# Patient Record
Sex: Male | Born: 1935 | Race: White | Hispanic: No | Smoking: Former smoker
Health system: Southern US, Community
[De-identification: ages and names within clinical notes are randomized; demographics above are authoritative.]

## PROBLEM LIST (undated history)

## (undated) DIAGNOSIS — J849 Interstitial pulmonary disease, unspecified: Secondary | ICD-10-CM

## (undated) DIAGNOSIS — E039 Hypothyroidism, unspecified: Secondary | ICD-10-CM

## (undated) DIAGNOSIS — E785 Hyperlipidemia, unspecified: Secondary | ICD-10-CM

## (undated) DIAGNOSIS — I10 Essential (primary) hypertension: Secondary | ICD-10-CM

## (undated) HISTORY — PX: FEMORAL ARTERY REPAIR: SHX1582

## (undated) HISTORY — DX: Hypothyroidism, unspecified: E03.9

## (undated) HISTORY — DX: Essential (primary) hypertension: I10

## (undated) HISTORY — DX: Hyperlipidemia, unspecified: E78.5

---

## 2010-05-20 ENCOUNTER — Encounter: Payer: Self-pay | Admitting: Cardiovascular Disease

## 2010-11-22 ENCOUNTER — Encounter: Payer: Self-pay | Admitting: Internal Medicine

## 2010-12-19 ENCOUNTER — Encounter: Payer: Self-pay | Admitting: Internal Medicine

## 2011-01-28 ENCOUNTER — Encounter: Payer: Self-pay | Admitting: Internal Medicine

## 2011-01-31 ENCOUNTER — Encounter: Payer: Self-pay | Admitting: Internal Medicine

## 2014-01-15 ENCOUNTER — Emergency Department: Payer: Self-pay | Admitting: Emergency Medicine

## 2015-04-15 ENCOUNTER — Emergency Department: Payer: Medicare Other

## 2015-04-15 ENCOUNTER — Inpatient Hospital Stay
Admission: EM | Admit: 2015-04-15 | Discharge: 2015-04-20 | DRG: 418 | Disposition: A | Discharge status: Home / Self Care | Payer: Medicare Other | Attending: General Surgery | Admitting: General Surgery

## 2015-04-15 DIAGNOSIS — R1011 Right upper quadrant pain: Secondary | ICD-10-CM | POA: Diagnosis present

## 2015-04-15 DIAGNOSIS — K819 Cholecystitis, unspecified: Secondary | ICD-10-CM | POA: Diagnosis present

## 2015-04-15 DIAGNOSIS — Z7982 Long term (current) use of aspirin: Secondary | ICD-10-CM | POA: Diagnosis not present

## 2015-04-15 DIAGNOSIS — Z87891 Personal history of nicotine dependence: Secondary | ICD-10-CM

## 2015-04-15 DIAGNOSIS — K567 Ileus, unspecified: Secondary | ICD-10-CM | POA: Diagnosis not present

## 2015-04-15 DIAGNOSIS — E785 Hyperlipidemia, unspecified: Secondary | ICD-10-CM | POA: Diagnosis present

## 2015-04-15 DIAGNOSIS — Z888 Allergy status to other drugs, medicaments and biological substances status: Secondary | ICD-10-CM | POA: Diagnosis not present

## 2015-04-15 DIAGNOSIS — Z79899 Other long term (current) drug therapy: Secondary | ICD-10-CM

## 2015-04-15 DIAGNOSIS — K8012 Calculus of gallbladder with acute and chronic cholecystitis without obstruction: Secondary | ICD-10-CM | POA: Diagnosis present

## 2015-04-15 DIAGNOSIS — E039 Hypothyroidism, unspecified: Secondary | ICD-10-CM | POA: Diagnosis present

## 2015-04-15 DIAGNOSIS — R5381 Other malaise: Secondary | ICD-10-CM | POA: Diagnosis not present

## 2015-04-15 DIAGNOSIS — K660 Peritoneal adhesions (postprocedural) (postinfection): Secondary | ICD-10-CM | POA: Diagnosis present

## 2015-04-15 DIAGNOSIS — I10 Essential (primary) hypertension: Secondary | ICD-10-CM | POA: Diagnosis present

## 2015-04-15 DIAGNOSIS — Z01818 Encounter for other preprocedural examination: Secondary | ICD-10-CM | POA: Diagnosis not present

## 2015-04-15 LAB — COMPREHENSIVE METABOLIC PANEL
ALT: 17 U/L (ref 17–63)
AST: 23 U/L (ref 15–41)
Albumin: 3.9 g/dL (ref 3.5–5.0)
Alkaline Phosphatase: 56 U/L (ref 38–126)
Anion gap: 7 (ref 5–15)
BILIRUBIN TOTAL: 1.5 mg/dL — AB (ref 0.3–1.2)
BUN: 23 mg/dL — AB (ref 6–20)
CO2: 26 mmol/L (ref 22–32)
CREATININE: 1.21 mg/dL (ref 0.61–1.24)
Calcium: 9.4 mg/dL (ref 8.9–10.3)
Chloride: 104 mmol/L (ref 101–111)
GFR, EST AFRICAN AMERICAN: 57 mL/min — AB (ref >60)
GFR, EST NON AFRICAN AMERICAN: 49 mL/min — AB (ref >60)
Glucose, Bld: 126 mg/dL — ABNORMAL HIGH (ref 65–99)
Potassium: 4.6 mmol/L (ref 3.5–5.1)
Sodium: 137 mmol/L (ref 135–145)
TOTAL PROTEIN: 7.8 g/dL (ref 6.5–8.1)

## 2015-04-15 LAB — CBC WITH DIFFERENTIAL/PLATELET
BASOS ABS: 0 10*3/uL (ref 0–0.1)
BASOS PCT: 1 %
EOS ABS: 0.1 10*3/uL (ref 0–0.7)
EOS PCT: 1 %
HCT: 43.9 % (ref 40.0–52.0)
Hemoglobin: 14.3 g/dL (ref 13.0–18.0)
Lymphocytes Relative: 13 %
Lymphs Abs: 1.1 10*3/uL (ref 1.0–3.6)
MCH: 31.4 pg (ref 26.0–34.0)
MCHC: 32.6 g/dL (ref 32.0–36.0)
MCV: 96.3 fL (ref 80.0–100.0)
Monocytes Absolute: 1.2 10*3/uL — ABNORMAL HIGH (ref 0.2–1.0)
Monocytes Relative: 15 %
Neutro Abs: 5.6 10*3/uL (ref 1.4–6.5)
Neutrophils Relative %: 70 %
PLATELETS: 184 10*3/uL (ref 150–440)
RBC: 4.55 MIL/uL (ref 4.40–5.90)
RDW: 13.5 % (ref 11.5–14.5)
WBC: 8 10*3/uL (ref 3.8–10.6)

## 2015-04-15 LAB — TROPONIN I

## 2015-04-15 LAB — LIPASE, BLOOD: Lipase: 24 U/L (ref 11–51)

## 2015-04-15 MED ORDER — ONDANSETRON HCL 4 MG/2ML IJ SOLN
4.0000 mg | Freq: Once | INTRAMUSCULAR | Status: AC
  Administered 2015-04-15: 4 mg via INTRAVENOUS
  Filled 2015-04-15: qty 2

## 2015-04-15 MED ORDER — DILTIAZEM HCL ER COATED BEADS 180 MG PO CP24
180.0000 mg | ORAL_CAPSULE | Freq: Every day | ORAL | Status: DC
  Administered 2015-04-15: 180 mg via ORAL
  Filled 2015-04-15 (×2): qty 1

## 2015-04-15 MED ORDER — DIPHENHYDRAMINE HCL 12.5 MG/5ML PO ELIX: 12.5000 mg | ORAL_SOLUTION | Freq: Four times a day (QID) | ORAL | Status: DC | PRN

## 2015-04-15 MED ORDER — FENTANYL CITRATE (PF) 100 MCG/2ML IJ SOLN
50.0000 ug | Freq: Once | INTRAMUSCULAR | Status: AC
  Administered 2015-04-15: 50 ug via INTRAVENOUS
  Filled 2015-04-15: qty 2

## 2015-04-15 MED ORDER — ONDANSETRON 4 MG PO TBDP: 4.0000 mg | ORAL_TABLET | Freq: Four times a day (QID) | ORAL | Status: DC | PRN

## 2015-04-15 MED ORDER — PIPERACILLIN-TAZOBACTAM 3.375 G IVPB
3.3750 g | Freq: Once | INTRAVENOUS | Status: AC
  Administered 2015-04-15: 3.375 g via INTRAVENOUS
  Filled 2015-04-15: qty 50

## 2015-04-15 MED ORDER — HYDRALAZINE HCL 20 MG/ML IJ SOLN: 10.0000 mg | INTRAMUSCULAR | Status: DC | PRN

## 2015-04-15 MED ORDER — LACTATED RINGERS IV SOLN
INTRAVENOUS | Status: DC
Start: 2015-04-15 — End: 2015-04-18
  Administered 2015-04-15: 1000 mL via INTRAVENOUS
  Administered 2015-04-16 – 2015-04-18 (×4): via INTRAVENOUS

## 2015-04-15 MED ORDER — MORPHINE SULFATE (PF) 2 MG/ML IV SOLN
2.0000 mg | INTRAVENOUS | Status: DC | PRN
  Administered 2015-04-15 – 2015-04-16 (×2): 2 mg via INTRAVENOUS
  Filled 2015-04-15 (×2): qty 1

## 2015-04-15 MED ORDER — SODIUM CHLORIDE 0.9 % IV BOLUS (SEPSIS)
1000.0000 mL | Freq: Once | INTRAVENOUS | Status: AC
  Administered 2015-04-15: 1000 mL via INTRAVENOUS

## 2015-04-15 MED ORDER — DIPHENHYDRAMINE HCL 50 MG/ML IJ SOLN: 12.5000 mg | Freq: Four times a day (QID) | INTRAMUSCULAR | Status: DC | PRN

## 2015-04-15 MED ORDER — ACETAMINOPHEN 325 MG PO TABS
650.0000 mg | ORAL_TABLET | Freq: Four times a day (QID) | ORAL | Status: DC | PRN
  Administered 2015-04-16 (×2): 650 mg via ORAL
  Filled 2015-04-15 (×2): qty 2

## 2015-04-15 MED ORDER — ROSUVASTATIN CALCIUM 10 MG PO TABS
20.0000 mg | ORAL_TABLET | Freq: Every day | ORAL | Status: DC
Start: 2015-04-15 — End: 2015-04-16
  Administered 2015-04-15: 20 mg via ORAL
  Filled 2015-04-15: qty 2

## 2015-04-15 MED ORDER — ENOXAPARIN SODIUM 40 MG/0.4ML ~~LOC~~ SOLN
40.0000 mg | SUBCUTANEOUS | Status: DC
Start: 2015-04-15 — End: 2015-04-20
  Administered 2015-04-15 – 2015-04-19 (×5): 40 mg via SUBCUTANEOUS
  Filled 2015-04-15 (×6): qty 0.4

## 2015-04-15 MED ORDER — ONDANSETRON HCL 4 MG/2ML IJ SOLN
4.0000 mg | Freq: Four times a day (QID) | INTRAMUSCULAR | Status: DC | PRN
  Administered 2015-04-16 – 2015-04-17 (×3): 4 mg via INTRAVENOUS
  Filled 2015-04-15 (×3): qty 2

## 2015-04-15 MED ORDER — FENTANYL CITRATE (PF) 100 MCG/2ML IJ SOLN
50.0000 ug | Freq: Once | INTRAMUSCULAR | Status: AC
Start: 2015-04-15 — End: 2015-04-15
  Administered 2015-04-15: 50 ug via INTRAVENOUS
  Filled 2015-04-15: qty 2

## 2015-04-15 MED ORDER — CEFTRIAXONE SODIUM 2 G IJ SOLR
2.0000 g | INTRAMUSCULAR | Status: DC
  Administered 2015-04-15 – 2015-04-18 (×4): 2 g via INTRAVENOUS
  Filled 2015-04-15 (×5): qty 2

## 2015-04-15 MED ORDER — HYDROCHLOROTHIAZIDE 25 MG PO TABS
25.0000 mg | ORAL_TABLET | Freq: Every day | ORAL | Status: DC
Start: 2015-04-15 — End: 2015-04-16
  Administered 2015-04-15: 25 mg via ORAL
  Filled 2015-04-15: qty 1

## 2015-04-15 NOTE — H&P (Signed)
Patient ID: SENG LARCH, male   DOB: Sep 17, 1917, 81 y.o.   MRN: [id number] CC: ABDOMINAL PAIN HPI Paul Cross is a 81 y.o. male presents emergency department with a three-day history of abdominal pain. Patient states he's been having right upper quadrant abdominal pain on and off for the last 2 weeks. It was worsened after eating grits. He states today the pain is right upper quadrant started and did not go away causing him to seek care in the emergency department. Prior to that and today he never had pain like this before. He states he's had subjective nausea without emesis. Otherwise he has been in his usual state of good health. He denies any fevers, chills, chest pain, shortness of breath, diarrhea, constipation.  HPI  Past Medical History  Diagnosis Date  . Hypothyroidism     approximately 2009  . Hypertension   . Hyperlipidemia     Past Surgical History  Procedure Laterality Date  . Femoral artery repair     abdominal aortic aneurysm repair.  Family History  Problem Relation Age of Onset  . Cancer Sister     breast  . Diabetes Brother     Social History Social History  Substance Use Topics  . Smoking status: Former Research scientist (life sciences)  . Smokeless tobacco: None  . Alcohol Use: None    Allergies  Allergen Reactions  . Diphenhydramine Hcl Other (See Comments)    Reaction:  Unknown   . Simvastatin Other (See Comments)    Reaction:  Unknown     Current Facility-Administered Medications  Medication Dose Route Frequency Provider Last Rate Last Dose  . cefTRIAXone (ROCEPHIN) 2 g in dextrose 5 % 50 mL IVPB  2 g Intravenous Q24H Clayburn Pert, MD      . diltiazem (CARDIZEM CD) 24 hr capsule 180 mg  180 mg Oral Daily Clayburn Pert, MD      . diphenhydrAMINE (BENADRYL) 12.5 MG/5ML elixir 12.5 mg  12.5 mg Oral Q6H PRN Clayburn Pert, MD       Or  . diphenhydrAMINE (BENADRYL) injection 12.5 mg  12.5 mg Intravenous Q6H PRN Clayburn Pert, MD      . enoxaparin  (LOVENOX) injection 40 mg  40 mg Subcutaneous Q24H Clayburn Pert, MD      . hydrALAZINE (APRESOLINE) injection 10 mg  10 mg Intravenous Q2H PRN Clayburn Pert, MD      . hydrochlorothiazide (HYDRODIURIL) tablet 25 mg  25 mg Oral Daily Clayburn Pert, MD      . lactated ringers infusion   Intravenous Continuous Clayburn Pert, MD      . morphine 2 MG/ML injection 2 mg  2 mg Intravenous Q2H PRN Clayburn Pert, MD      . ondansetron (ZOFRAN-ODT) disintegrating tablet 4 mg  4 mg Oral Q6H PRN Clayburn Pert, MD       Or  . ondansetron [facility]) injection 4 mg  4 mg Intravenous Q6H PRN Clayburn Pert, MD      . rosuvastatin (CRESTOR) tablet 20 mg  20 mg Oral QHS Clayburn Pert, MD         Review of Systems A multipoint review of systems was completed. All pertinent positives and negatives within the history of present illness the remainder were negative.  Physical Exam Blood pressure 139/71, pulse 76, temperature 97.9 F (36.6 C), temperature source Oral, resp. rate 19, height 6' (1.829 m), weight 90.719 kg (200 lb), SpO2 94 %. CONSTITUTIONAL: Resting in bed in no acute distress. EYES: Pupils are  equal, round, and reactive to light, Sclera are non-icteric. EARS, NOSE, MOUTH AND THROAT: The oropharynx is clear. The oral mucosa is pink and moist. Hearing is intact to voice. LYMPH NODES:  Lymph nodes in the neck are normal. RESPIRATORY:  Lungs are clear. There is normal respiratory effort, with equal breath sounds bilaterally, and without pathologic use of accessory muscles. CARDIOVASCULAR: Heart is regular without murmurs, gallops, or rubs. GI: The abdomen is soft, tender to deep palpation in the right upper quadrant without Murphy sign, and nondistended. There is a large well-healed midline scar .There are no palpable masses. There is no hepatosplenomegaly. There are normal bowel sounds in all quadrants. GU: Rectal deferred.   MUSCULOSKELETAL: Normal muscle strength and tone. No cyanosis  or edema.   SKIN: Turgor is good and there are no pathologic skin lesions or ulcers. NEUROLOGIC: Motor and sensation is grossly normal. Cranial nerves are grossly intact. PSYCH:  Oriented to person, place and time. Affect is normal.  Data Reviewed All labs and images reviewed by me. Labs mostly within normal limits. Mildly elevated bilirubin of 1.5. CBC within normal limits. Ultrasound right upper quadrant shows evidence of cholecystitis. There is a thickened gallbladder wall with some pericholecystic fluid and a stone lodged at the infundibulum. I have personally reviewed the patient's imaging, laboratory findings and medical records.    Assessment    81 year old male with acute cholecystitis    Plan    Had a long discussion with patient about the surgical treatments of cholecystitis. However given his age we'll plan to admit to the ward for preoperative clearance. Patient was discussed with anesthesia who recommended medical consultation prior to surgery. We will tentatively plan for surgery tomorrow after medical clearance.     Time spent with the patient was 30 minutes, with more than 50% of the time spent in face-to-face education, counseling and care coordination.     Clayburn Pert 04/15/2015, 5:34 PM

## 2015-04-15 NOTE — ED Notes (Signed)
Spoke with Dr. Burlene Arnt about patient and EKG.  Verbal order obtained for troponin.

## 2015-04-15 NOTE — ED Provider Notes (Signed)
[facility] Emergency Department Provider Note  ____________________________________________  Time seen: Approximately 1:23 PM  I have reviewed the triage vital signs and the nursing notes.   HISTORY  Chief Complaint Abdominal Pain    HPI Paul Cross is a 81 y.o. male with a history of femoral artery repair, HTN, presentingwith epigastric and right upper quadrant pain associated with nausea. Patient reports that over the past 2 weeks he has had 2 separate episodes where he developed a dull ache in the epigastric and right upper quadrants after eating grits. This was associated with nausea and vomiting. This morning at 2 AM he woke up with this same pain with nausea but no vomiting. He describes a "dead" feeling in the abdomen with sharp pains with positional changes. He denies any fever, chills, urinary symptoms, chest pain or shortness of breath, or trauma. His pain is significantly improved with fentanyl.   Past Medical History  Diagnosis Date  . Hypothyroidism     approximately 2009  . Hypertension   . Hyperlipidemia     There are no active problems to display for this patient.   Past Surgical History  Procedure Laterality Date  . Femoral artery repair      Current Outpatient Rx  Name  Route  Sig  Dispense  Refill  . aspirin EC 81 MG tablet   Oral   Take 81 mg by mouth daily.         Marland Kitchen diltiazem (CARDIZEM CD) 180 MG 24 hr capsule   Oral   Take 180 mg by mouth daily.           . hydrochlorothiazide 25 MG tablet   Oral   Take 25 mg by mouth daily.           . Levothyroxine Sodium (SYNTHROID PO)   Oral   Take 1 tablet by mouth daily.          . rosuvastatin (CRESTOR) 20 MG tablet   Oral   Take 20 mg by mouth at bedtime.          . vitamin B-12 (CYANOCOBALAMIN) 1000 MCG tablet   Oral   Take 1,000 mcg by mouth daily.         . Vitamin D, Ergocalciferol, (DRISDOL) 50000 UNITS CAPS capsule   Oral   Take 50,000  Units by mouth every 30 (thirty) days.           Allergies Diphenhydramine hcl and Simvastatin  Family History  Problem Relation Age of Onset  . Cancer Sister     breast  . Diabetes Brother     Social History Social History  Substance Use Topics  . Smoking status: Former Research scientist (life sciences)  . Smokeless tobacco: None  . Alcohol Use: None    Review of Systems Constitutional: No fever/chills. No lightheadedness or syncope. Eyes: No visual changes. ENT: No sore throat. Cardiovascular: Denies chest pain, palpitations. Respiratory: Denies shortness of breath.  No cough. Gastrointestinal: Positive epigastric and right upper quadrant abdominal pain.  Positive nausea and vomiting.  No diarrhea; last bowel movement was this morning which was normal except for that straining causes pain..  No constipation. Genitourinary: Negative for dysuria. Musculoskeletal: Negative for back pain. Skin: Negative for rash. Neurological: Negative for headaches, focal weakness or numbness.  10-point ROS otherwise negative.  ____________________________________________   PHYSICAL EXAM:  VITAL SIGNS: ED Triage Vitals  Enc Vitals Group     BP 04/15/15 1030 145/68 mmHg     Pulse  Rate 04/15/15 1030 90     Resp 04/15/15 1030 16     Temp 04/15/15 1030 97.9 F (36.6 C)     Temp Source 04/15/15 1030 Oral     SpO2 04/15/15 1030 97 %     Weight 04/15/15 1030 200 lb (90.719 kg)     Height 04/15/15 1030 6' (1.829 m)     Head Cir --      Peak Flow --      Pain Score 04/15/15 1031 10     Pain Loc --      Pain Edu? --      Excl. in [place]? --     Constitutional: Alert and oriented. Well appearing and in no acute distress lying comfortably in the stretcher.. Answer question appropriately. Eyes: Conjunctivae are normal.  EOMI. Head: Atraumatic. Nose: No congestion/rhinnorhea. Mouth/Throat: Mucous membranes are moist.  Neck: No stridor.  Supple.   Cardiovascular: Normal rate, regular rhythm. No murmurs, rubs or  gallops.  Respiratory: Normal respiratory effort.  No retractions. Lungs CTAB.  No wheezes, rales or ronchi. Gastrointestinal: Abdomen is soft and nondistended. The patient has right upper quadrant greater than epigastric and right lower quadrant pain. No guarding or rebound. No peritoneal signs. Bowel sounds are minimal. Musculoskeletal: No LE edema.  Neurologic:  Normal speech and language. No gross focal neurologic deficits are appreciated.  Skin:  Skin is warm, dry and intact. No rash noted. Psychiatric: Mood and affect are normal. Speech and behavior are normal.  Normal judgement.  ____________________________________________   LABS (all labs ordered are listed, but only abnormal results are displayed)  Labs Reviewed  COMPREHENSIVE METABOLIC PANEL - Abnormal; Notable for the following:    Glucose, Bld 126 (*)    BUN 23 (*)    Total Bilirubin 1.5 (*)    GFR calc non Af Amer 49 (*)    GFR calc Af Amer 57 (*)    All other components within normal limits  CBC WITH DIFFERENTIAL/PLATELET - Abnormal; Notable for the following:    Monocytes Absolute 1.2 (*)    All other components within normal limits  LIPASE, BLOOD  TROPONIN I   ____________________________________________  EKG  ED ECG REPORT I, Eula Listen, the attending physician, personally viewed and interpreted this ECG.   Date: 04/15/2015  EKG Time: 1035  Rate: 89  Rhythm: LBBB  Axis: Leftward  Intervals:none  ST&T Change: No ST elevation. Negative for [place] criteria.  We have no previous EKGs for comparison.  ____________________________________________  RADIOLOGY  No results found.  ____________________________________________   PROCEDURES  Procedure(s) performed: None  Critical Care performed: No ____________________________________________   INITIAL IMPRESSION / ASSESSMENT AND PLAN / ED COURSE  Pertinent labs & imaging results that were available during my care of the patient were  reviewed by me and considered in my medical decision making (see chart for details).  81 y.o. male with a history of femoral artery repair presenting with epigastric and right upper quadrant pain that is precipitated by meals. His exam is consistent with possible gallbladder disease. He is afebrile at this time and nontoxic. He does have a left bundle branch block on his EKG and we have no previous EKGs for comparison. He is not having chest pain or shortness of breath however in this age group it is possible that he is having atypical anginal equivalent. This is less likely than a primary gallbladder disease. I will plan to ultrasound his gallbladder and if that is negative CT scan of  the abdomen. We will also try and his troponin. I will continue his symptomatically treatment which appears to be improving his pain at this time.   ----------------------------------------- 2:56 PM on 04/15/2015 -----------------------------------------  The patient has noticed on that is consistent with acute cholecystitis. He is afebrile and has normal white blood cell count. I have called the surgeon on-call to evaluate the patient. ____________________________________________  FINAL CLINICAL IMPRESSION(S) / ED DIAGNOSES  Final diagnoses:  Right upper quadrant pain      NEW MEDICATIONS STARTED DURING THIS VISIT:  New Prescriptions   No medications on file     Eula Listen, MD 04/15/15 1902

## 2015-04-15 NOTE — Consult Note (Signed)
Patient Demographics  Paul Cross, is a 81 y.o. male   MRN: [id number]   DOB - [birth year]  Admit Date - 04/15/2015    Outpatient Primary MD for the patient is No primary care provider on file.  Consult requested in the Hospital by Clayburn Pert, MD, On 04/15/2015    Reason for consult; preop clearance for cholecystectomy   With History of -  Past Medical History  Diagnosis Date  . Hypothyroidism     approximately 2009  . Hypertension   . Hyperlipidemia       Past Surgical History  Procedure Laterality Date  . Femoral artery repair      in for   Chief Complaint  Patient presents with  . Abdominal Pain     HPI  Paul Cross  is a 81 y.o. male,  With htn,HLP admitted to surgical unit because of abdominal pain and cholecystitis. Preop clearance is requested. Patient denies chest pain, shortness of breath, coronary artery disease. He lives alone and uses cane for ambulation.   Review of Systems    In addition to the HPI above No Fever-chills, No Headache, No changes with Vision or hearing, No problems swallowing food or Liquids, No Chest pain, Cough or Shortness of Breath, Right upper quadrant abdominal pain, .No Blood in stool or Urine, No dysuria, No new skin rashes or bruises, No new joints pains-aches,  No new weakness, tingling, numbness in any extremity, No recent weight gain or loss, No polyuria, polydypsia or polyphagia, No significant Mental Stressors.  A full 10 point Review of Systems was done, except as stated above, all other Review of Systems were negative.   Social History Social History  Substance Use Topics  . Smoking status: Former Research scientist (life sciences)  . Smokeless tobacco: Not on file  . Alcohol Use: Not on file    Family History Family History  Problem Relation Age  of Onset  . Cancer Sister     breast  . Diabetes Brother      Prior to Admission medications   Medication Sig Start Date End Date Taking? Authorizing Provider  aspirin EC 81 MG tablet Take 81 mg by mouth daily.   Yes Historical Provider, MD  atorvastatin (LIPITOR) 40 MG tablet Take 20 mg by mouth at bedtime.   Yes Historical Provider, MD  diltiazem (DILACOR XR) 120 MG 24 hr capsule Take 120 mg by mouth daily.   Yes Historical Provider, MD  levothyroxine (SYNTHROID, LEVOTHROID) 100 MCG tablet Take 100 mcg by mouth daily before breakfast.   Yes Historical Provider, MD  tamsulosin (FLOMAX) 0.4 MG CAPS capsule Take 0.4 mg by mouth daily.   Yes Historical Provider, MD  vitamin B-12 (CYANOCOBALAMIN) 1000 MCG tablet Take 1,000 mcg by mouth daily.   Yes Historical Provider, MD  Vitamin D, Ergocalciferol, (DRISDOL) 50000 UNITS CAPS capsule Take 50,000 Units by mouth every 30 (thirty) days.   Yes Historical Provider, MD  zinc oxide 20 % ointment Apply 1 application topically 2 (two) times daily.  Yes Historical Provider, MD    Anti-infectives    Start     Dose/Rate Route Frequency Ordered Stop   04/15/15 1730  cefTRIAXone (ROCEPHIN) 2 g in dextrose 5 % 50 mL IVPB     2 g 100 mL/hr over 30 Minutes Intravenous Every 24 hours 04/15/15 1726     04/15/15 1515  piperacillin-tazobactam (ZOSYN) IVPB 3.375 g     3.375 g 12.5 mL/hr over 240 Minutes Intravenous  Once 04/15/15 1501 04/15/15 1651      Scheduled Meds: . cefTRIAXone (ROCEPHIN)  IV  2 g Intravenous Q24H  . diltiazem  180 mg Oral Daily  . enoxaparin (LOVENOX) injection  40 mg Subcutaneous Q24H  . hydrochlorothiazide  25 mg Oral Daily  . rosuvastatin  20 mg Oral QHS   Continuous Infusions: . lactated ringers 1,000 mL (04/15/15 1830)   PRN Meds:.diphenhydrAMINE **OR** diphenhydrAMINE, hydrALAZINE, morphine injection, ondansetron **OR** ondansetron (ZOFRAN) IV  Allergies  Allergen Reactions  . Diphenhydramine Hcl Other (See Comments)     Reaction:  Unknown   . Simvastatin Other (See Comments)    Reaction:  Unknown     Physical Exam  Vitals  Blood pressure 131/69, pulse 82, temperature 99.2 F (37.3 C), temperature source Oral, resp. rate 18, height 6' (1.829 m), weight 89.086 kg (196 lb 6.4 oz), SpO2 92 %.   1. General  lying in bed in some mild right upper quadrant pain present,  2. Normal affect and insight, Not Suicidal or Homicidal, Awake Alert, Oriented X 3.  3. No F.N deficits, ALL C.Nerves Intact, Strength 5/5 all 4 extremities, Sensation intact all 4 extremities, Plantars down going.  4. Ears and Eyes appear Normal, Conjunctivae clear, PERRLA. Moist Oral Mucosa.  5. Supple Neck, No JVD, No cervical lymphadenopathy appriciated, No Carotid Bruits.  6. Symmetrical Chest wall movement, Good air movement bilaterally, CTAB.  7. RRR, No Gallops, Rubs or Murmurs, No Parasternal Heave.  8. Positive Bowel Sounds, Abdomen Soft, right upper  quadrant tenderness.present. No organomegaly appriciated,No rebound -guarding or rigidity.  9.  No Cyanosis, Normal Skin Turgor, No Skin Rash or Bruise.  10. Good muscle tone,  joints appear normal , no effusions, Normal ROM.  11. No Palpable Lymph Nodes in Neck or Axillae   Data Review  CBC  Recent Labs Lab 04/15/15 1042  WBC 8.0  HGB 14.3  HCT 43.9  PLT 184  MCV 96.3  MCH 31.4  MCHC 32.6  RDW 13.5  LYMPHSABS 1.1  MONOABS 1.2*  EOSABS 0.1  BASOSABS 0.0   ------------------------------------------------------------------------------------------------------------------  Chemistries   Recent Labs Lab 04/15/15 1042  NA 137  K 4.6  CL 104  CO2 26  GLUCOSE 126*  BUN 23*  CREATININE 1.21  CALCIUM 9.4  AST 23  ALT 17  ALKPHOS 56  BILITOT 1.5*   ------------------------------------------------------------------------------------------------------------------ estimated creatinine clearance is 40.1 mL/min (by C-G formula based on Cr of  1.21). ------------------------------------------------------------------------------------------------------------------ No results for input(s): TSH, T4TOTAL, T3FREE, THYROIDAB in the last 72 hours.  Invalid input(s): FREET3   Coagulation profile No results for input(s): INR, PROTIME in the last 168 hours. ------------------------------------------------------------------------------------------------------------------- No results for input(s): DDIMER in the last 72 hours. -------------------------------------------------------------------------------------------------------------------  Cardiac Enzymes  Recent Labs Lab 04/15/15 1042  TROPONINI <0.03   ------------------------------------------------------------------------------------------------------------------ Invalid input(s): POCBNP   ---------------------------------------------------------------------------------------------------------------  Urinalysis No results found for: COLORURINE, APPEARANCEUR, LABSPEC, PHURINE, GLUCOSEU, HGBUR, BILIRUBINUR, KETONESUR, PROTEINUR, UROBILINOGEN, NITRITE, LEUKOCYTESUR   Imaging results:   US Abdomen Limited Ruq  04/15/2015  CLINICAL DATA:  Right upper quadrant abdominal pain since 2 a.m. today. EXAM: US ABDOMEN LIMITED - RIGHT UPPER QUADRANT COMPARISON:  None. FINDINGS: Gallbladder: Gallbladder wall is thickened, measuring 5.9 mm. A 2.0 cm gallstone is sludge within the gallbladder neck. Although there is no sonographic Murphy sign, the patient was given pain medication prior to the exam. Common bile duct: Diameter: 5.1 mm Liver: Visualized portion of the liver has a normal appearance. The left hepatic lobe is obscured by bowel gas. IMPRESSION: 1. Gallbladder wall thickening and gallstone lodged within the gallbladder neck, suspicious for acute cholecystitis despite the negative sonographic Murphy's sign. As the patient had pain medication, the Murphy's sign may not be reliable. 2.  Obscured left hepatic lobe. Electronically Signed   By: Nolon Nations M.D.   On: 04/15/2015 14:51    My personal review of EKG: Rhythm NSR, Rate  89 /min, , no Acute ST changes, EKG done in the emergency room and intubated by ER physician.    Assessment & Plan  Active Problems:   Cholecystitis   81 year old male patient with hypertension and hyperlipidemia without any coronary artery disease or COPD admitted for acute cholecystitis: The patient is at moderate risk for the planned surgery secondary to advanced age, risk factors of hypertension and hyperlipidemia. Recommend postop incentive spirometry and the perioperative blood pressure control with IV beta blockers.  For hypertension patient is already on HCTZ, rhythm advised to continue them. #3. for hyperlipidemia he is already on a*20 mg daily. Continue them  DVT Prophylaxis Heparin -  Lovenox - SCDs   AM Labs Ordered, also please review Full Orders  Family Communication: Plan discussed with patient and    Thank you for the consult, we will follow the patient with you in the Hospital.   [facility] M.D on 04/15/2015 at 7:58 PM  Note: This dictation was prepared with Dragon dictation along with smaller phrase technology. Any transcriptional errors that result from this process are unintentional.

## 2015-04-15 NOTE — ED Notes (Signed)
Patient complains of mid abdominal pain that radiates to right side and around back.  Patient reports having BM yesterday and is passing gas.  Patient reports attempting to have BM this morning but pain gets worse when bearing down.

## 2015-04-16 ENCOUNTER — Inpatient Hospital Stay: Payer: Medicare Other | Admitting: Anesthesiology

## 2015-04-16 ENCOUNTER — Encounter: Payer: Self-pay | Admitting: General Surgery

## 2015-04-16 ENCOUNTER — Encounter
Admission: EM | Disposition: A | Discharge status: Home / Self Care | Payer: Self-pay | Source: Home / Self Care | Attending: General Surgery

## 2015-04-16 DIAGNOSIS — K819 Cholecystitis, unspecified: Secondary | ICD-10-CM

## 2015-04-16 HISTORY — PX: CHOLECYSTECTOMY: SHX55

## 2015-04-16 SURGERY — LAPAROSCOPIC CHOLECYSTECTOMY
Anesthesia: General

## 2015-04-16 MED ORDER — ROCURONIUM BROMIDE 100 MG/10ML IV SOLN
INTRAVENOUS | Status: DC | PRN
  Administered 2015-04-16: 10 mg via INTRAVENOUS
  Administered 2015-04-16: 20 mg via INTRAVENOUS

## 2015-04-16 MED ORDER — ATORVASTATIN CALCIUM 20 MG PO TABS
20.0000 mg | ORAL_TABLET | Freq: Every day | ORAL | Status: DC
  Administered 2015-04-16 – 2015-04-19 (×3): 20 mg via ORAL
  Filled 2015-04-16 (×3): qty 1

## 2015-04-16 MED ORDER — PROPOFOL 10 MG/ML IV BOLUS
INTRAVENOUS | Status: DC | PRN
  Administered 2015-04-16: 100 mg via INTRAVENOUS

## 2015-04-16 MED ORDER — SUCCINYLCHOLINE CHLORIDE 20 MG/ML IJ SOLN
INTRAMUSCULAR | Status: DC | PRN
  Administered 2015-04-16: 100 mg via INTRAVENOUS

## 2015-04-16 MED ORDER — FENTANYL CITRATE (PF) 100 MCG/2ML IJ SOLN
INTRAMUSCULAR | Status: DC | PRN
  Administered 2015-04-16: 50 ug via INTRAVENOUS
  Administered 2015-04-16: 25 ug via INTRAVENOUS
  Administered 2015-04-16: 50 ug via INTRAVENOUS

## 2015-04-16 MED ORDER — LEVOTHYROXINE SODIUM 100 MCG PO TABS
100.0000 ug | ORAL_TABLET | Freq: Every day | ORAL | Status: DC
  Administered 2015-04-17 – 2015-04-20 (×4): 100 ug via ORAL
  Filled 2015-04-16 (×5): qty 1

## 2015-04-16 MED ORDER — LIDOCAINE HCL (PF) 1 % IJ SOLN
INTRAMUSCULAR | Status: AC
  Filled 2015-04-16: qty 30

## 2015-04-16 MED ORDER — GLYCOPYRROLATE 0.2 MG/ML IJ SOLN
INTRAMUSCULAR | Status: DC | PRN
  Administered 2015-04-16: .6 mg via INTRAVENOUS

## 2015-04-16 MED ORDER — DILTIAZEM HCL ER 120 MG PO CP24
120.0000 mg | ORAL_CAPSULE | Freq: Every day | ORAL | Status: DC
  Administered 2015-04-16 – 2015-04-20 (×5): 120 mg via ORAL
  Filled 2015-04-16 (×10): qty 1

## 2015-04-16 MED ORDER — METOCLOPRAMIDE HCL 5 MG/ML IJ SOLN
INTRAMUSCULAR | Status: AC
  Administered 2015-04-16: 10 mg via INTRAVENOUS
  Filled 2015-04-16: qty 2

## 2015-04-16 MED ORDER — METOCLOPRAMIDE HCL 5 MG/ML IJ SOLN
10.0000 mg | Freq: Once | INTRAMUSCULAR | Status: AC
  Administered 2015-04-16: 10 mg via INTRAVENOUS

## 2015-04-16 MED ORDER — BUPIVACAINE HCL 0.5 % IJ SOLN
INTRAMUSCULAR | Status: DC | PRN
  Administered 2015-04-16: 12.5 mL

## 2015-04-16 MED ORDER — BUPIVACAINE HCL (PF) 0.5 % IJ SOLN
INTRAMUSCULAR | Status: AC
  Filled 2015-04-16: qty 30

## 2015-04-16 MED ORDER — NEOSTIGMINE METHYLSULFATE 10 MG/10ML IV SOLN
INTRAVENOUS | Status: DC | PRN
  Administered 2015-04-16: 4 mg via INTRAVENOUS

## 2015-04-16 MED ORDER — MEPERIDINE HCL 25 MG/ML IJ SOLN: 6.2500 mg | INTRAMUSCULAR | Status: DC | PRN

## 2015-04-16 MED ORDER — LIDOCAINE HCL (PF) 1 % IJ SOLN
INTRAMUSCULAR | Status: DC | PRN
  Administered 2015-04-16: 12.5 mL

## 2015-04-16 MED ORDER — FENTANYL CITRATE (PF) 100 MCG/2ML IJ SOLN
25.0000 ug | INTRAMUSCULAR | Status: DC | PRN
  Administered 2015-04-16 (×4): 25 ug via INTRAVENOUS

## 2015-04-16 MED ORDER — FENTANYL CITRATE (PF) 100 MCG/2ML IJ SOLN
INTRAMUSCULAR | Status: AC
  Administered 2015-04-16: 25 ug via INTRAVENOUS
  Filled 2015-04-16: qty 2

## 2015-04-16 MED ORDER — TAMSULOSIN HCL 0.4 MG PO CAPS
0.4000 mg | ORAL_CAPSULE | Freq: Every day | ORAL | Status: DC
  Administered 2015-04-16 – 2015-04-20 (×5): 0.4 mg via ORAL
  Filled 2015-04-16 (×5): qty 1

## 2015-04-16 MED ORDER — EPHEDRINE SULFATE 50 MG/ML IJ SOLN
INTRAMUSCULAR | Status: DC | PRN
  Administered 2015-04-16: 5 mg via INTRAVENOUS

## 2015-04-16 SURGICAL SUPPLY — 49 items
ADHESIVE MASTISOL STRL (MISCELLANEOUS) IMPLANT
APPLIER CLIP ROT 10 11.4 M/L (STAPLE) ×3
BAG COUNTER SPONGE EZ (MISCELLANEOUS) ×2 IMPLANT
BLADE SURG SZ11 CARB STEEL (BLADE) ×3 IMPLANT
BULB RESERV EVAC DRAIN JP 100C (MISCELLANEOUS) IMPLANT
CANISTER SUCT 1200ML W/VALVE (MISCELLANEOUS) ×3 IMPLANT
CATH REDDICK CHOLANGI 4FR 50CM (CATHETERS) IMPLANT
CHLORAPREP W/TINT 26ML (MISCELLANEOUS) ×3 IMPLANT
CLIP APPLIE ROT 10 11.4 M/L (STAPLE) ×1 IMPLANT
CLOSURE WOUND 1/2 X4 (GAUZE/BANDAGES/DRESSINGS)
CONRAY 60ML FOR OR (MISCELLANEOUS) IMPLANT
COUNTER SPONGE BAG EZ (MISCELLANEOUS) ×1
DISSECTOR KITTNER STICK (MISCELLANEOUS) ×1 IMPLANT
DISSECTORS/KITTNER STICK (MISCELLANEOUS) ×3
DRAIN CHANNEL JP 19F (MISCELLANEOUS) IMPLANT
DRAPE SHEET LG 3/4 BI-LAMINATE (DRAPES) ×3 IMPLANT
DRSG TEGADERM 2-3/8X2-3/4 SM (GAUZE/BANDAGES/DRESSINGS) IMPLANT
DRSG TELFA 3X8 NADH (GAUZE/BANDAGES/DRESSINGS) IMPLANT
ENDOPOUCH RETRIEVER 10 (MISCELLANEOUS) ×3 IMPLANT
GLOVE BIO SURGEON STRL SZ7.5 (GLOVE) ×3 IMPLANT
GLOVE INDICATOR 8.0 STRL GRN (GLOVE) ×3 IMPLANT
GOWN STRL REUS W/ TWL LRG LVL3 (GOWN DISPOSABLE) ×3 IMPLANT
GOWN STRL REUS W/TWL LRG LVL3 (GOWN DISPOSABLE) ×6
IRRIGATION STRYKERFLOW (MISCELLANEOUS) IMPLANT
IRRIGATOR STRYKERFLOW (MISCELLANEOUS)
IV CATH ANGIO 12GX3 LT BLUE (NEEDLE) IMPLANT
IV NS 1000ML (IV SOLUTION)
IV NS 1000ML BAXH (IV SOLUTION) IMPLANT
L-HOOK LAP DISP 36CM (ELECTROSURGICAL) ×3
LABEL OR SOLS (LABEL) ×3 IMPLANT
LHOOK LAP DISP 36CM (ELECTROSURGICAL) ×1 IMPLANT
LIQUID BAND (GAUZE/BANDAGES/DRESSINGS) IMPLANT
NEEDLE HYPO 25X1 1.5 SAFETY (NEEDLE) ×3 IMPLANT
NEEDLE VERESS 14GA 120MM (NEEDLE) ×3 IMPLANT
NS IRRIG 500ML POUR BTL (IV SOLUTION) ×3 IMPLANT
PACK LAP CHOLECYSTECTOMY (MISCELLANEOUS) ×3 IMPLANT
PAD GROUND ADULT SPLIT (MISCELLANEOUS) ×3 IMPLANT
PENCIL ELECTRO HAND CTR (MISCELLANEOUS) ×3 IMPLANT
SCISSORS METZENBAUM CVD 33 (INSTRUMENTS) ×3 IMPLANT
SLEEVE ENDOPATH XCEL 5M (ENDOMECHANICALS) ×6 IMPLANT
STRAP SAFETY BODY (MISCELLANEOUS) ×3 IMPLANT
STRIP CLOSURE SKIN 1/2X4 (GAUZE/BANDAGES/DRESSINGS) IMPLANT
SUT MNCRL 4-0 (SUTURE) ×2
SUT MNCRL 4-0 27XMFL (SUTURE) ×1
SUT VICRYL 0 AB UR-6 (SUTURE) ×3 IMPLANT
SUTURE MNCRL 4-0 27XMF (SUTURE) ×1 IMPLANT
TROCAR XCEL NON-BLD 11X100MML (ENDOMECHANICALS) ×3 IMPLANT
TROCAR XCEL NON-BLD 5MMX100MML (ENDOMECHANICALS) ×3 IMPLANT
TUBING INSUFFLATOR HI FLOW (MISCELLANEOUS) ×3 IMPLANT

## 2015-04-16 NOTE — Op Note (Signed)
Laparoscopic Cholecystectomy  Pre-operative Diagnosis: Acute Cholecystitis  Post-operative Diagnosis: Acute on Chronic Cholecystitis  Procedure: Laparoscopic Cholecystectomy  Surgeon: Juanda Crumble T. Adonis Huguenin, MD FACS  Anesthesia: Gen. with endotracheal tube  Assistant:None  Procedure Details  The patient was seen again in the Holding Room. The benefits, complications, treatment options, and expected outcomes were discussed with the patient. The risks of bleeding, infection, recurrence of symptoms, failure to resolve symptoms, bile duct damage, bile duct leak, retained common bile duct stone, bowel injury, any of which could require further surgery and/or ERCP, stent, or papillotomy were reviewed with the patient. The likelihood of improving the patient's symptoms with return to their baseline status is good.  The patient and/or family concurred with the proposed plan, giving informed consent.  The patient was taken to Operating Room, identified as Paul Cross and the procedure verified as Laparoscopic Cholecystectomy.  A Time Out was held and the above information confirmed.  Prior to the induction of general anesthesia, antibiotic prophylaxis was administered. VTE prophylaxis was in place. General endotracheal anesthesia was then administered and tolerated well. After the induction, the abdomen was prepped with Chloraprep and draped in the sterile fashion. The patient was positioned in the supine position.  Local anesthetic  was injected into the skin in the right upper quadrant in the mid-clavicular line. The Veress needle was placed. Pneumoperitoneum was then created with CO2 and tolerated well without any adverse changes in the patient's vital signs. A 15mm port was placed in the periumbilical position and the abdominal cavity was explored.  Two 5-mm ports were placed, one at the umbilicus and another in the right upper quadrant and a 12 mm epigastric port was placed all under direct  vision. All skin incisions  were infiltrated with a local anesthetic agent before making the incision and placing the trocars. Multiple small bowel adhesions were noted to the abdominal wall. They were able to be left alone as they did not impede visualization.  The patient was positioned  in reverse Trendelenburg, tilted slightly to the patient's left.  The gallbladder was identified, the fundus grasped and retracted cephalad. Adhesions were lysed bluntly. The adhesions were thick and acutely inflamed. The infundibulum was grasped and retracted laterally, exposing the peritoneum overlying the triangle of Calot. This was then divided and exposed in a blunt fashion. A critical view of the cystic duct and cystic artery was obtained.  The cystic duct was clearly identified and bluntly dissected. The duct and artery were serially clipped with three endo-clips and then cut with endo-shears.  The gallbladder was taken from the gallbladder fossa in a retrograde fashion with the electrocautery. The gallbladder was removed and placed in an Endocatch bag. The liver bed was irrigated and inspected. Hemostasis was achieved with the electrocautery. Copious irrigation was utilized and was repeatedly aspirated until clear.  The gallbladder and Endocatch sac were then removed through the epigastric port site. The port site had to be enlarged in order to extract the gallbladder.The posterior gallbladder wall was entered into during it's dissection off the liver. This was cauterized and left in place.   Inspection of the right upper quadrant was performed. No bleeding, bile duct injury or leak, or bowel injury was noted. Pneumoperitoneum was released.  The epigastric port site was closed with figure-of-eight 0 Vicryl sutures. 4-0 subcuticular Monocryl was used to close the skin. Steristrips and Mastisol and sterile dressings were  applied.  The patient was then extubated and brought to the recovery room in stable  condition.  Sponge, lap, and needle counts were correct at closure and at the conclusion of the case.   Findings: Acute on Chronic Cholecystitis   Estimated Blood Loss: 30         Drains: none         Specimens: Gallbladder           Complications: none               Paul Fern T. Adonis Huguenin, MD, FACS

## 2015-04-16 NOTE — Brief Op Note (Signed)
04/15/2015 - 04/16/2015  12:41 PM  PATIENT:  Paul Cross  81 y.o. male  PRE-OPERATIVE DIAGNOSIS:  gallstones  POST-OPERATIVE DIAGNOSIS:  gallstones   PROCEDURE:  Procedure(s): LAPAROSCOPIC CHOLECYSTECTOMY (N/A)  SURGEON:  Surgeon(s) and Role:    * Clayburn Pert, MD - Primary  PHYSICIAN ASSISTANT:   ASSISTANTS: none   ANESTHESIA:   general  EBL:  Total I/O In: 1513.3 [I.V.:1513.3] Out: 600 [Urine:600]  BLOOD ADMINISTERED:none  DRAINS: none   LOCAL MEDICATIONS USED:  MARCAINE   , XYLOCAINE  and Amount: 25 ml  SPECIMEN:  Source of Specimen:  gallbladder  DISPOSITION OF SPECIMEN:  PATHOLOGY  COUNTS:  YES  TOURNIQUET:  * No tourniquets in log *  DICTATION: .Dragon Dictation  PLAN OF CARE: return to inpatient setting  PATIENT DISPOSITION:  PACU - hemodynamically stable.   Delay start of Pharmacological VTE agent (>24hrs) due to surgical blood loss or risk of bleeding: no

## 2015-04-16 NOTE — Progress Notes (Deleted)
Notified Dr. Genevive Bi that patient did cough up blood one other time. Also per Dr. Genevive Bi okay to place order for ensure per patient request.

## 2015-04-16 NOTE — Progress Notes (Addendum)
Spoke to Dr. Adonis Huguenin, okay to make suggested med changes per pharmacy note on summary page as verified by [facility] pharmacy.

## 2015-04-16 NOTE — Anesthesia Postprocedure Evaluation (Signed)
  Anesthesia Post-op Note  Patient: Paul Cross  Procedure(s) Performed: Procedure(s): LAPAROSCOPIC CHOLECYSTECTOMY (N/A)  Anesthesia type:General  Patient location: PACU  Post pain: Pain level controlled  Post assessment: Post-op Vital signs reviewed, Patient's Cardiovascular Status Stable, Respiratory Function Stable, Patent Airway and No signs of Nausea or vomiting  Post vital signs: Reviewed and stable  Last Vitals:  Filed Vitals:   04/16/15 1340  BP: 144/76  Pulse: 78  Temp: 36.8 C  Resp: 19    Level of consciousness: awake, alert  and patient cooperative  Complications: No apparent anesthesia complications

## 2015-04-16 NOTE — Anesthesia Procedure Notes (Signed)
Procedure Name: Intubation Date/Time: 04/16/2015 11:28 AM Performed by: Aline Brochure Pre-anesthesia Checklist: Patient identified, Emergency Drugs available, Suction available and Patient being monitored Patient Re-evaluated:Patient Re-evaluated prior to inductionOxygen Delivery Method: Circle system utilized Preoxygenation: Pre-oxygenation with 100% oxygen Intubation Type: IV induction and Cricoid Pressure applied Ventilation: Oral airway inserted - appropriate to patient size and Mask ventilation without difficulty Laryngoscope Size: Mac and 3 Grade View: Grade I Tube size: 7.5 mm Number of attempts: 1 Airway Equipment and Method: Patient positioned with wedge pillow and Stylet Placement Confirmation: ETT inserted through vocal cords under direct vision,  positive ETCO2 and breath sounds checked- equal and bilateral Secured at: 21 cm Tube secured with: Tape Dental Injury: Teeth and Oropharynx as per pre-operative assessment

## 2015-04-16 NOTE — Anesthesia Preprocedure Evaluation (Signed)
Anesthesia Evaluation  Patient identified by MRN, date of birth, ID band Patient awake    Airway Mallampati: III  TM Distance: <3 FB     Dental  (+) Edentulous Upper, Edentulous Lower   Pulmonary neg pulmonary ROS, former smoker,    breath sounds clear to auscultation       Cardiovascular hypertension,  Rhythm:Regular Rate:Normal     Neuro/Psych negative neurological ROS     GI/Hepatic Neg liver ROS, GERD  ,  Endo/Other  Hypothyroidism   Renal/GU negative Renal ROS  negative genitourinary   Musculoskeletal   Abdominal (+)  Abdomen: soft.    Peds  Hematology   Anesthesia Other Findings   Reproductive/Obstetrics                             Anesthesia Physical Anesthesia Plan  ASA: III  Anesthesia Plan: General   Post-op Pain Management:    Induction: Intravenous  Airway Management Planned: Oral ETT  Additional Equipment:   Intra-op Plan:   Post-operative Plan: Extubation in OR  Informed Consent: I have reviewed the patients History and Physical, chart, labs and discussed the procedure including the risks, benefits and alternatives for the proposed anesthesia with the patient or authorized representative who has indicated his/her understanding and acceptance.     Plan Discussed with: CRNA  Anesthesia Plan Comments:         Anesthesia Quick Evaluation

## 2015-04-16 NOTE — Progress Notes (Signed)
Day of Surgery   Subjective:  81 year old male status post laparoscopic cholecystectomy. Patient states he feels much better now than before surgery. He is hungry and ready for dinner.  Vital signs in last 24 hours: Temp:  [97.5 F (36.4 C)-101.3 F (38.5 C)] 97.8 F (36.6 C) (10/27 1528) Pulse Rate:  [67-89] 80 (10/27 1528) Resp:  [14-21] 20 (10/27 1528) BP: (130-162)/(59-108) 143/75 mmHg (10/27 1528) SpO2:  [86 %-99 %] 99 % (10/27 1528) Last BM Date: 04/13/15  Intake/Output from previous day: 10/26 0701 - 10/27 0700 In: 786.7 [I.V.:786.7] Out: 600 [Urine:600]  GI: Abdomen is soft, appropriately tender to palpation around incisions. Nondistended.  Lab Results:  CBC  Recent Labs  04/15/15 1042  WBC 8.0  HGB 14.3  HCT 43.9  PLT 184   CMP     Component Value Date/Time   NA 137 04/15/2015 1042   K 4.6 04/15/2015 1042   CL 104 04/15/2015 1042   CO2 26 04/15/2015 1042   GLUCOSE 126* 04/15/2015 1042   BUN 23* 04/15/2015 1042   CREATININE 1.21 04/15/2015 1042   CALCIUM 9.4 04/15/2015 1042   PROT 7.8 04/15/2015 1042   ALBUMIN 3.9 04/15/2015 1042   AST 23 04/15/2015 1042   ALT 17 04/15/2015 1042   ALKPHOS 56 04/15/2015 1042   BILITOT 1.5* 04/15/2015 1042   GFRNONAA 49* 04/15/2015 1042   GFRAA 57* 04/15/2015 1042   PT/INR No results for input(s): LABPROT, INR in the last 72 hours.  Studies/Results: US Abdomen Limited Ruq  04/15/2015  CLINICAL DATA:  Right upper quadrant abdominal pain since 2 a.m. today. EXAM: US ABDOMEN LIMITED - RIGHT UPPER QUADRANT COMPARISON:  None. FINDINGS: Gallbladder: Gallbladder wall is thickened, measuring 5.9 mm. A 2.0 cm gallstone is sludge within the gallbladder neck. Although there is no sonographic Murphy sign, the patient was given pain medication prior to the exam. Common bile duct: Diameter: 5.1 mm Liver: Visualized portion of the liver has a normal appearance. The left hepatic lobe is obscured by bowel gas. IMPRESSION: 1.  Gallbladder wall thickening and gallstone lodged within the gallbladder neck, suspicious for acute cholecystitis despite the negative sonographic Murphy's sign. As the patient had pain medication, the Murphy's sign may not be reliable. 2. Obscured left hepatic lobe. Electronically Signed   By: Nolon Nations M.D.   On: 04/15/2015 14:51    Assessment/Plan: 81 year old male status post lap scopic cholestatic. Discussed with the patient and family about keeping in the hospital 1 additional night due to his advanced age. Lungs he progresses well overnight plan for discharge in the morning.  Clayburn Pert, MD FACS General Surgeon [facility]

## 2015-04-16 NOTE — Transfer of Care (Signed)
Immediate Anesthesia Transfer of Care Note  Patient: Paul Cross  Procedure(s) Performed: Procedure(s): LAPAROSCOPIC CHOLECYSTECTOMY (N/A)  Patient Location: PACU  Anesthesia Type:General  Level of Consciousness: sedated  Airway & Oxygen Therapy: Patient Spontanous Breathing and Patient connected to face mask oxygen  Post-op Assessment: Report given to RN and Post -op Vital signs reviewed and unstable, Anesthesiologist notified  Post vital signs: Reviewed and stable  Last Vitals:  Filed Vitals:   04/16/15 0841  BP: 150/62  Pulse: 69  Temp: 36.8 C  Resp: 20    Complications: No apparent anesthesia complications

## 2015-04-17 ENCOUNTER — Inpatient Hospital Stay: Payer: Medicare Other

## 2015-04-17 LAB — SURGICAL PATHOLOGY

## 2015-04-17 MED ORDER — CALCIUM CARBONATE ANTACID 500 MG PO CHEW: 1.0000 | CHEWABLE_TABLET | Freq: Three times a day (TID) | ORAL | Status: DC | PRN

## 2015-04-17 NOTE — Progress Notes (Signed)
1 Day Post-Op   Subjective:  81 year old male 1 day status post laparoscopic cholecystectomy for acute on chronic cholecystitis. Patient had episodes of nausea and vomiting early this morning. However he states his pain continues to be much improved. He denies having much of an appetite at this time. He has not been out of bed however he has passed flatus.  Vital signs in last 24 hours: Temp:  [97.5 F (36.4 C)-99.2 F (37.3 C)] 98.6 F (37 C) (10/28 0425) Pulse Rate:  [75-91] 80 (10/28 0429) Resp:  [14-21] 20 (10/28 0425) BP: (129-162)/(52-108) 150/60 mmHg (10/28 0505) SpO2:  [90 %-99 %] 93 % (10/28 0425) Last BM Date: 04/13/15  Intake/Output from previous day: 10/27 0701 - 10/28 0700 In: 3805.3 [P.O.:60; I.V.:3745.3] Out: 2195 [Urine:2175; Blood:20]  GI: Abdomen is soft, appropriately tender to palpation at incision sites, mildly distended and tympanic consistent with retained CO2.  Lab Results:  CBC  Recent Labs  04/15/15 1042  WBC 8.0  HGB 14.3  HCT 43.9  PLT 184   CMP     Component Value Date/Time   NA 137 04/15/2015 1042   K 4.6 04/15/2015 1042   CL 104 04/15/2015 1042   CO2 26 04/15/2015 1042   GLUCOSE 126* 04/15/2015 1042   BUN 23* 04/15/2015 1042   CREATININE 1.21 04/15/2015 1042   CALCIUM 9.4 04/15/2015 1042   PROT 7.8 04/15/2015 1042   ALBUMIN 3.9 04/15/2015 1042   AST 23 04/15/2015 1042   ALT 17 04/15/2015 1042   ALKPHOS 56 04/15/2015 1042   BILITOT 1.5* 04/15/2015 1042   GFRNONAA 49* 04/15/2015 1042   GFRAA 57* 04/15/2015 1042   PT/INR No results for input(s): LABPROT, INR in the last 72 hours.  Studies/Results: US Abdomen Limited Ruq  04/15/2015  CLINICAL DATA:  Right upper quadrant abdominal pain since 2 a.m. today. EXAM: US ABDOMEN LIMITED - RIGHT UPPER QUADRANT COMPARISON:  None. FINDINGS: Gallbladder: Gallbladder wall is thickened, measuring 5.9 mm. A 2.0 cm gallstone is sludge within the gallbladder neck. Although there is no sonographic  Murphy sign, the patient was given pain medication prior to the exam. Common bile duct: Diameter: 5.1 mm Liver: Visualized portion of the liver has a normal appearance. The left hepatic lobe is obscured by bowel gas. IMPRESSION: 1. Gallbladder wall thickening and gallstone lodged within the gallbladder neck, suspicious for acute cholecystitis despite the negative sonographic Murphy's sign. As the patient had pain medication, the Murphy's sign may not be reliable. 2. Obscured left hepatic lobe. Electronically Signed   By: Nolon Nations M.D.   On: 04/15/2015 14:51    Assessment/Plan: 81 year old male postop day 1 status post lap scopic cholecystectomy. Discussed the importance of ambulation and requirement of tolerating a diet prior to be heavily discharged home. Patient daughter both voiced understanding. He is to ambulate today with assistance. If able to ambulate and tolerate diet we will then be ready for discharge.  Clayburn Pert, MD FACS General Surgeon [facility]

## 2015-04-17 NOTE — Care Management Note (Signed)
Case Management Note  Patient Details  Name: Paul Cross MRN: [id number] Date of Birth: [birth year]  Subjective/Objective:   Case management assessment for discharge planning needs. Admitted with cholecystitis. S/P lap/Chole 10/27. Patient lives at home alone. He is alert, oriented and independent with adls. Walking on unit.                   Action/Plan: No needs identified. Case closed.   Expected Discharge Date:                  Expected Discharge Plan:  Home/Self Care  In-House Referral:     Discharge planning Services  CM Consult  Post Acute Care Choice:    Choice offered to:     DME Arranged:    DME Agency:     HH Arranged:    [place] Agency:     Status of Service:  Completed, signed off  Medicare Important Message Given:  Yes-second notification given Date Medicare IM Given:    Medicare IM give by:    Date Additional Medicare IM Given:    Additional Medicare Important Message give by:     If discussed at [place] of Stay Meetings, dates discussed:    Additional Comments:  Jolly Mango, RN 04/17/2015, 1:38 PM

## 2015-04-17 NOTE — Progress Notes (Signed)
[place] at [facility] Internal medicine consult follow up   PATIENT NAME: Paul Cross    MR#:  [id number]  DATE OF BIRTH:  [birth year]  SUBJECTIVE:  CHIEF COMPLAINT:   Chief Complaint  Patient presents with  . Abdominal Pain   Episode of nausea this morning. Improving with walking and sitting up in chair.  REVIEW OF SYSTEMS:   Review of Systems  Constitutional: Negative for fever.  Respiratory: Negative for shortness of breath.   Cardiovascular: Negative for chest pain, palpitations and leg swelling.  Gastrointestinal: Negative for nausea, vomiting and abdominal pain.  Genitourinary: Negative for dysuria.  Neurological: Negative for dizziness, sensory change and headaches.    DRUG ALLERGIES:   Allergies  Allergen Reactions  . Diphenhydramine Hcl Other (See Comments)    Reaction:  Unknown   . Simvastatin Other (See Comments)    Reaction:  Unknown     VITALS:  Blood pressure 129/72, pulse 88, temperature 98.8 F (37.1 C), temperature source Oral, resp. rate 20, height 6' (1.829 m), weight 89.086 kg (196 lb 6.4 oz), SpO2 95 %.  PHYSICAL EXAMINATION:  GENERAL:  81 y.o.-year-old patient sitting up with no acute distress.  EYES: Pupils equal, round, reactive to light and accommodation. No scleral icterus. Extraocular muscles intact.  LUNGS: Normal breath sounds bilaterally, no wheezing, rales,rhonchi or crepitation. No use of accessory muscles of respiration.  CARDIOVASCULAR: S1, S2 normal. No murmurs, rubs, or gallops.  ABDOMEN: Soft, mild tenderness, nondistended. Bowel sounds present. No organomegaly or mass.  EXTREMITIES: No pedal edema, cyanosis, or clubbing. Pulses +1 NEUROLOGIC: Cranial nerves II through XII are intact. Muscle strength 5/5 in all extremities. Sensation intact. Gait not checked.  PSYCHIATRIC: The patient is alert and oriented x 3.  SKIN: surgical wounds are dressed and dry   LABORATORY PANEL:    CBC  Recent Labs Lab 04/15/15 1042  WBC 8.0  HGB 14.3  HCT 43.9  PLT 184   ------------------------------------------------------------------------------------------------------------------  Chemistries   Recent Labs Lab 04/15/15 1042  NA 137  K 4.6  CL 104  CO2 26  GLUCOSE 126*  BUN 23*  CREATININE 1.21  CALCIUM 9.4  AST 23  ALT 17  ALKPHOS 56  BILITOT 1.5*   ------------------------------------------------------------------------------------------------------------------  Cardiac Enzymes  Recent Labs Lab 04/15/15 1042  TROPONINI <0.03   ------------------------------------------------------------------------------------------------------------------  RADIOLOGY:  US Abdomen Limited Ruq  04/15/2015  CLINICAL DATA:  Right upper quadrant abdominal pain since 2 a.m. today. EXAM: US ABDOMEN LIMITED - RIGHT UPPER QUADRANT COMPARISON:  None. FINDINGS: Gallbladder: Gallbladder wall is thickened, measuring 5.9 mm. A 2.0 cm gallstone is sludge within the gallbladder neck. Although there is no sonographic Murphy sign, the patient was given pain medication prior to the exam. Common bile duct: Diameter: 5.1 mm Liver: Visualized portion of the liver has a normal appearance. The left hepatic lobe is obscured by bowel gas. IMPRESSION: 1. Gallbladder wall thickening and gallstone lodged within the gallbladder neck, suspicious for acute cholecystitis despite the negative sonographic Murphy's sign. As the patient had pain medication, the Murphy's sign may not be reliable. 2. Obscured left hepatic lobe. Electronically Signed   By: Nolon Nations M.D.   On: 04/15/2015 14:51    EKG:   Orders placed or performed during the hospital encounter of 04/15/15  . EKG 12-Lead  . EKG 12-Lead  . EKG 12-Lead  . EKG 12-Lead  . EKG    ASSESSMENT AND PLAN:   1) hypertension: elevated pressures likely related  to pain. Would hesitate to treat this age group aggressively and I am  comfortable with current pressures. No changes to make, continue diltiazem.  2) hyperlipidemia: continue statin  Overall patient doing well. Agree with potential dc for today.   All the records are reviewed and case discussed with Care Management/Social Workerr. Management plans discussed with the patient, family and they are in agreement.  CODE STATUS: full  TOTAL TIME TAKING CARE OF THIS PATIENT: 15 minutes.  Greater than 50% of time spent in care coordination and counseling. POSSIBLE D/C IN 1 DAYS, DEPENDING ON CLINICAL CONDITION.   Myrtis Ser M.D on 04/17/2015 at 10:56 AM  Between 7am to 6pm - Pager - [phone]  After 6pm go to [email or web address] - password [email or web address]  [place] [facility]  Office  [phone]  CC: Primary care physician; No primary care provider on file.

## 2015-04-17 NOTE — Care Management Important Message (Signed)
Important Message  Patient Details  Name: FOSTER FRERICKS MRN: [id number] Date of Birth: [birth year]   Medicare Important Message Given:  Yes-second notification given    Darius Bump Naheim Burgen 04/17/2015, 10:33 AM

## 2015-04-17 NOTE — Progress Notes (Signed)
Asked to evaluate Mr Paul Cross for abdominal distention and vomiting. Per nurse has vomited approx 1300 cc today.  Still with nausea.  Distended on exam.  Also reports burning in throat.  Will obtain KUB for baseline, have recommended NG tube for comfort.  Will review KUB with patient and will discuss NG tube further.

## 2015-04-18 LAB — CREATININE, SERUM
Creatinine, Ser: 1.21 mg/dL (ref 0.61–1.24)
GFR, EST AFRICAN AMERICAN: 57 mL/min — AB (ref >60)
GFR, EST NON AFRICAN AMERICAN: 49 mL/min — AB (ref >60)

## 2015-04-18 MED ORDER — DEXTROSE-NACL 5-0.9 % IV SOLN
INTRAVENOUS | Status: DC
  Administered 2015-04-18 – 2015-04-20 (×4): via INTRAVENOUS

## 2015-04-18 NOTE — Progress Notes (Signed)
[place] at [facility] Internal medicine consult follow up   PATIENT NAME: Paul Cross    MR#:  [id number]  DATE OF BIRTH:  05/16/1918  SUBJECTIVE:  CHIEF COMPLAINT:   Chief Complaint  Patient presents with  . Abdominal Pain   Nausea and vomiting yesterday revealing significant gastric distention and likely ileus after which patient had NG tube placed. Feels good today. Denies any nausea, vomiting or abdominal pain. Blood pressure is good  REVIEW OF SYSTEMS:   Review of Systems  Constitutional: Negative for fever.  Respiratory: Negative for shortness of breath.   Cardiovascular: Negative for chest pain, palpitations and leg swelling.  Gastrointestinal: Negative for nausea, vomiting and abdominal pain.  Genitourinary: Negative for dysuria.  Neurological: Negative for dizziness, sensory change and headaches.    DRUG ALLERGIES:   Allergies  Allergen Reactions  . Diphenhydramine Hcl Other (See Comments)    Reaction:  Unknown   . Simvastatin Other (See Comments)    Reaction:  Unknown     VITALS:  Blood pressure 133/61, pulse 79, temperature 98 F (36.7 C), temperature source Oral, resp. rate 18, height 6' (1.829 m), weight 89.086 kg (196 lb 6.4 oz), SpO2 95 %.  PHYSICAL EXAMINATION:  GENERAL:  81 y.o.-year-old patient sitting up with no acute distress. NG tube is in the house EYES: Pupils equal, round, reactive to light and accommodation. No scleral icterus. Extraocular muscles intact.  LUNGS: Normal breath sounds bilaterally, no wheezing, rales,rhonchi or crepitation. No use of accessory muscles of respiration.  CARDIOVASCULAR: S1, S2 normal. No murmurs, rubs, or gallops.  ABDOMEN: Soft, mild tenderness, nondistended. Bowel sounds present. No organomegaly or mass.  EXTREMITIES: No pedal edema, cyanosis, or clubbing. Pulses +1 NEUROLOGIC: Cranial nerves II through XII are intact. Muscle strength 5/5 in all extremities. Sensation  intact. Gait not checked.  PSYCHIATRIC: The patient is alert and oriented x 3.  SKIN: surgical wounds are dressed and dry   LABORATORY PANEL:   CBC  Recent Labs Lab 04/15/15 1042  WBC 8.0  HGB 14.3  HCT 43.9  PLT 184   ------------------------------------------------------------------------------------------------------------------  Chemistries   Recent Labs Lab 04/15/15 1042 04/18/15 0647  NA 137  --   K 4.6  --   CL 104  --   CO2 26  --   GLUCOSE 126*  --   BUN 23*  --   CREATININE 1.21 1.21  CALCIUM 9.4  --   AST 23  --   ALT 17  --   ALKPHOS 56  --   BILITOT 1.5*  --    ------------------------------------------------------------------------------------------------------------------  Cardiac Enzymes  Recent Labs Lab 04/15/15 1042  TROPONINI <0.03   ------------------------------------------------------------------------------------------------------------------  RADIOLOGY:  Dg Abd 1 View  04/17/2015  CLINICAL DATA:  Uncontrollable vomiting EXAM: ABDOMEN - 1 VIEW COMPARISON:  None. FINDINGS: Stomach is diffusely distended with air. There are loops of dilated bowel, concerning for a degree of obstruction. Air is noted in the rectum, however. No free air is evident. There is atelectatic change in lung bases. There are foci of arterial vascular calcification. IMPRESSION: Stomach distended with air. Loops of dilated small bowel, concerning for a degree of obstruction. There is noted in the rectum, however. Electronically Signed   By: Lowella Grip III M.D.   On: 04/17/2015 21:24    EKG:   Orders placed or performed during the hospital encounter of 04/15/15  . EKG 12-Lead  . EKG 12-Lead  . EKG 12-Lead  . EKG  12-Lead  . EKG    ASSESSMENT AND PLAN:   1) essential hypertension: elevated pressures likely related to pain. Would hesitate to treat this age group aggressively and I am comfortable with current pressures. No changes to make, continue  diltiazem.  2) hyperlipidemia: continue statin 3. Ileus, continue NG tube suctioning per surgery, withdrawal, depending on resolution 4. Nausea, vomiting, result is NG tube placement. Symptomatic therapy, IV fluids  Overall patient doing well. Agree with potential dc for today.   All the records are reviewed and case discussed with Care Management/Social Workerr. Management plans discussed with the patient, family and they are in agreement.  CODE STATUS: full  TOTAL TIME TAKING CARE OF THIS PATIENT: 25 minutes.  Greater than 50% of time spent in care coordination and counseling. Discussed with surgery. We'll sign off now. Please call with questions. Thank you   Ladaja Yusupov M.D on 04/18/2015 at 11:27 AM  Between 7am to 6pm - Pager - [phone]  After 6pm go to [email or web address] - password [email or web address] [facility]  [facility]  Office  [phone]  CC: Primary care physician; No primary care provider on file.

## 2015-04-18 NOTE — Progress Notes (Signed)
2 Days Post-Op   Subjective:  81 year old male postop day #2 status post laparoscopic cholecystectomy for acute cholecystitis. Patient developed continued nausea and vomiting overnight. Requiring placement of an NG tube. Patient states he feels much better after the NG tube was placed. No continued nausea. No abdominal pain.  Vital signs in last 24 hours: Temp:  [98 F (36.7 C)-98.8 F (37.1 C)] 98 F (36.7 C) (10/29 0456) Pulse Rate:  [79-97] 79 (10/29 0456) Resp:  [18-20] 18 (10/29 0456) BP: (123-133)/(60-74) 133/61 mmHg (10/29 0456) SpO2:  [87 %-95 %] 95 % (10/29 0456) Last BM Date: 04/14/15  Intake/Output from previous day: 10/28 0701 - 10/29 0700 In: 3267.6 [P.O.:720; I.V.:2407.6; NG/GT:90; IV Piggyback:50] Out: 6967 [Urine:1650; Emesis/NG output:2380]  Physical exam: Gen.: Resting bed in no acute distress Chest: Clear to auscultation Heart: Regular rate and rhythm GI: Abdomen is soft, nontender, minimally distended.  Lab Results:  CBC  Recent Labs  04/15/15 1042  WBC 8.0  HGB 14.3  HCT 43.9  PLT 184   CMP     Component Value Date/Time   NA 137 04/15/2015 1042   K 4.6 04/15/2015 1042   CL 104 04/15/2015 1042   CO2 26 04/15/2015 1042   GLUCOSE 126* 04/15/2015 1042   BUN 23* 04/15/2015 1042   CREATININE 1.21 04/18/2015 0647   CALCIUM 9.4 04/15/2015 1042   PROT 7.8 04/15/2015 1042   ALBUMIN 3.9 04/15/2015 1042   AST 23 04/15/2015 1042   ALT 17 04/15/2015 1042   ALKPHOS 56 04/15/2015 1042   BILITOT 1.5* 04/15/2015 1042   GFRNONAA 49* 04/18/2015 0647   GFRAA 57* 04/18/2015 0647   PT/INR No results for input(s): LABPROT, INR in the last 72 hours.  Studies/Results: Dg Abd 1 View  04/17/2015  CLINICAL DATA:  Uncontrollable vomiting EXAM: ABDOMEN - 1 VIEW COMPARISON:  None. FINDINGS: Stomach is diffusely distended with air. There are loops of dilated bowel, concerning for a degree of obstruction. Air is noted in the rectum, however. No free air is evident.  There is atelectatic change in lung bases. There are foci of arterial vascular calcification. IMPRESSION: Stomach distended with air. Loops of dilated small bowel, concerning for a degree of obstruction. There is noted in the rectum, however. Electronically Signed   By: Lowella Grip III M.D.   On: 04/17/2015 21:24    Assessment/Plan: 81 year old male status post lap scopic cholecystectomy for acute on chronic cholecystitis. Patient appears to have a proximal ileus. Symptoms much improved with NG tube. We'll continue to treat with NG tube decompression. We will trend output. Patient on a return of bowel function and functional status prior to discharge.  Clayburn Pert, MD [facility] General Surgeon [facility] Surgical  04/18/2015

## 2015-04-19 LAB — CBC
HEMATOCRIT: 35.8 % — AB (ref 40.0–52.0)
Hemoglobin: 12.2 g/dL — ABNORMAL LOW (ref 13.0–18.0)
MCH: 32.2 pg (ref 26.0–34.0)
MCHC: 34 g/dL (ref 32.0–36.0)
MCV: 94.8 fL (ref 80.0–100.0)
PLATELETS: 196 10*3/uL (ref 150–440)
RBC: 3.78 MIL/uL — AB (ref 4.40–5.90)
RDW: 13.3 % (ref 11.5–14.5)
WBC: 5.9 10*3/uL (ref 3.8–10.6)

## 2015-04-19 LAB — MAGNESIUM: MAGNESIUM: 2 mg/dL (ref 1.7–2.4)

## 2015-04-19 LAB — BASIC METABOLIC PANEL
ANION GAP: 7 (ref 5–15)
BUN: 15 mg/dL (ref 6–20)
CHLORIDE: 104 mmol/L (ref 101–111)
CO2: 28 mmol/L (ref 22–32)
Calcium: 8.4 mg/dL — ABNORMAL LOW (ref 8.9–10.3)
Creatinine, Ser: 0.96 mg/dL (ref 0.61–1.24)
GFR calc Af Amer: >60 mL/min (ref >60)
GLUCOSE: 120 mg/dL — AB (ref 65–99)
POTASSIUM: 3.4 mmol/L — AB (ref 3.5–5.1)
Sodium: 139 mmol/L (ref 135–145)

## 2015-04-19 LAB — HEPATIC FUNCTION PANEL
ALBUMIN: 2.7 g/dL — AB (ref 3.5–5.0)
ALT: 23 U/L (ref 17–63)
AST: 29 U/L (ref 15–41)
Alkaline Phosphatase: 54 U/L (ref 38–126)
BILIRUBIN TOTAL: 0.5 mg/dL (ref 0.3–1.2)
TOTAL PROTEIN: 6.1 g/dL — AB (ref 6.5–8.1)

## 2015-04-19 LAB — PHOSPHORUS: PHOSPHORUS: 2.1 mg/dL — AB (ref 2.5–4.6)

## 2015-04-19 NOTE — Progress Notes (Signed)
3 Days Post-Op   Subjective:  81 year old male postop day #3 status post lap scopic cholecystectomy for acute cholecystitis. States he continued to feel better with NG tube decompression overnight. His output has decreased overnight. He denies any current nausea or vomiting. Has a general malaise this morning is ready to go home.  Vital signs in last 24 hours: Temp:  [98 F (36.7 C)-98.3 F (36.8 C)] 98.3 F (36.8 C) (10/30 0548) Pulse Rate:  [70-76] 76 (10/30 0548) Resp:  [16-20] 16 (10/30 0548) BP: (132-161)/(66-71) 161/71 mmHg (10/30 0548) SpO2:  [92 %-97 %] 93 % (10/30 0548) Last BM Date: 04/14/15  Intake/Output from previous day: 10/29 0701 - 10/30 0700 In: 1878.1 [I.V.:1788.1; NG/GT:90] Out: 2150 [Urine:950; Emesis/NG output:1200]  GI: soft, non-tender; bowel sounds normal; no masses,  no organomegaly  Lab Results:  CBC  Recent Labs  04/19/15 0551  WBC 5.9  HGB 12.2*  HCT 35.8*  PLT 196   CMP     Component Value Date/Time   NA 139 04/19/2015 0551   K 3.4* 04/19/2015 0551   CL 104 04/19/2015 0551   CO2 28 04/19/2015 0551   GLUCOSE 120* 04/19/2015 0551   BUN 15 04/19/2015 0551   CREATININE 0.96 04/19/2015 0551   CALCIUM 8.4* 04/19/2015 0551   PROT 7.8 04/15/2015 1042   ALBUMIN 3.9 04/15/2015 1042   AST 23 04/15/2015 1042   ALT 17 04/15/2015 1042   ALKPHOS 56 04/15/2015 1042   BILITOT 1.5* 04/15/2015 1042   GFRNONAA >60 04/19/2015 0551   GFRAA >60 04/19/2015 0551   PT/INR No results for input(s): LABPROT, INR in the last 72 hours.  Studies/Results: Dg Abd 1 View  04/17/2015  CLINICAL DATA:  Uncontrollable vomiting EXAM: ABDOMEN - 1 VIEW COMPARISON:  None. FINDINGS: Stomach is diffusely distended with air. There are loops of dilated bowel, concerning for a degree of obstruction. Air is noted in the rectum, however. No free air is evident. There is atelectatic change in lung bases. There are foci of arterial vascular calcification. IMPRESSION: Stomach  distended with air. Loops of dilated small bowel, concerning for a degree of obstruction. There is noted in the rectum, however. Electronically Signed   By: Lowella Grip III M.D.   On: 04/17/2015 21:24    Assessment/Plan: 81 year old male status post lap scopic cholecystectomy for acute cholecystitis. Place NG tube to clamp trial today. We'll recheck her gastric residual His gastric residuals is minimal will then remove NG tube. Continue IV hydration and supportive care.  Clayburn Pert, MD [facility] General Surgeon [facility] Surgical 04/19/2015

## 2015-04-20 MED ORDER — DEXTROSE 50 % IV SOLN
INTRAVENOUS | Status: AC
  Filled 2015-04-20: qty 50

## 2015-04-20 MED ORDER — HYDROCODONE-ACETAMINOPHEN 5-300 MG PO TABS: 1.0000 | ORAL_TABLET | ORAL | Status: DC | PRN

## 2015-04-20 NOTE — Care Management Important Message (Signed)
Important Message  Patient Details  Name: Paul Cross MRN: [id number] Date of Birth: [birth year]   Medicare Important Message Given:  Yes-third notification given    [name] 04/20/2015, 10:07 AM

## 2015-04-20 NOTE — Progress Notes (Signed)
4 Days Post-Op  Subjective: S post laparoscopic cholecystectomy for acute cholecystitis. He feels well and feels better than he has in weeks. He is tolerating a regular diet. Social history was reviewed with he and his daughter he lives alone his daughter lives in [place] but from a disposition standpoint she agrees to stay with him and help out until he is back to his normal preoperative state. Not want to consider rehabilitation placement  Objective: Vital signs in last 24 hours: Temp:  [98.3 F (36.8 C)-99.8 F (37.7 C)] 98.3 F (36.8 C) (10/31 1007) Pulse Rate:  [64-75] 64 (10/31 1007) Resp:  [14-18] 16 (10/31 0500) BP: (128-164)/(54-71) 137/58 mmHg (10/31 1007) SpO2:  [90 %-92 %] 90 % (10/31 0500) Last BM Date: 04/14/15  Intake/Output from previous day: 10/30 0701 - 10/31 0700 In: 3071.5 [P.O.:640; I.V.:2431.5] Out: 950 [Urine:950] Intake/Output this shift: Total I/O In: 1151 [P.O.:750; I.V.:401] Out: 125 [Urine:125]  Physical exam:  Soft nontender abdomen nondistended wounds cleanicterus no jaundice  Lab Results: CBC   Recent Labs  04/19/15 0551  WBC 5.9  HGB 12.2*  HCT 35.8*  PLT 196   BMET  Recent Labs  04/18/15 0647 04/19/15 0551  NA  --  139  K  --  3.4*  CL  --  104  CO2  --  28  GLUCOSE  --  120*  BUN  --  15  CREATININE 1.21 0.96  CALCIUM  --  8.4*   PT/INR No results for input(s): LABPROT, INR in the last 72 hours. ABG No results for input(s): PHART, HCO3 in the last 72 hours.  Invalid input(s): PCO2, PO2  Studies/Results: No results found.  Anti-infectives: Anti-infectives    Start     Dose/Rate Route Frequency Ordered Stop   04/15/15 1730  cefTRIAXone (ROCEPHIN) 2 g in dextrose 5 % 50 mL IVPB  Status:  Discontinued     2 g 100 mL/hr over 30 Minutes Intravenous Every 24 hours 04/15/15 1726 04/19/15 0702   04/15/15 1515  piperacillin-tazobactam (ZOSYN) IVPB 3.375 g     3.375 g 12.5 mL/hr over 240 Minutes Intravenous  Once  04/15/15 1501 04/15/15 1651      Assessment/Plan: s/p Procedure(s): LAPAROSCOPIC CHOLECYSTECTOMY   Patient doing very well recommend follow up after discharge in one week he is instructed to shower and advance to and through a regular diet. This is discussed with he and his daughter.  Florene Glen, MD, FACS  04/20/2015

## 2015-04-20 NOTE — Progress Notes (Signed)
Initial Nutrition Assessment   INTERVENTION:  Meals and snacks: Cater to pt preferences, await diet progression Nutrition diet education: Discussed easy to digest, bland foods with pt and dtr and encouraged while re-introducing solid foods.  Pt and dtr verbalized understanding and expect good compliance.    NUTRITION DIAGNOSIS:   Inadequate oral intake related to altered GI function as evidenced by  (NPO/Clear liquid day 5).    GOAL:   Patient will meet greater than or equal to 90% of their needs    MONITOR:    (Energy intake, Digestive system)  REASON FOR ASSESSMENT:   NPO/Clear Liquid Diet    ASSESSMENT:      Pt s/p lap cholecystectomy, ileus with NG tube placement, essential HTN  Past Medical History  Diagnosis Date  . Hypothyroidism     approximately 2009  . Hypertension   . Hyperlipidemia     Current Nutrition: tolerating liquids  Food/Nutrition-Related History: pt reports eating 3 meals per day prior to admission   Scheduled Medications:  . atorvastatin  20 mg Oral q1800  . dextrose      . diltiazem  120 mg Oral Daily  . enoxaparin (LOVENOX) injection  40 mg Subcutaneous Q24H  . levothyroxine  100 mcg Oral QAC breakfast  . tamsulosin  0.4 mg Oral Daily    Continuous Medications:  . dextrose 5 % and 0.9% NaCl 100 mL/hr at 04/20/15 0528     Electrolyte/Renal Profile and Glucose Profile:   Recent Labs Lab 04/15/15 1042 04/18/15 0647 04/19/15 0551  NA 137  --  139  K 4.6  --  3.4*  CL 104  --  104  CO2 26  --  28  BUN 23*  --  15  CREATININE 1.21 1.21 0.96  CALCIUM 9.4  --  8.4*  MG  --   --  2.0  PHOS  --   --  2.1*  GLUCOSE 126*  --  120*   Protein Profile:  Recent Labs Lab 04/15/15 1042 04/19/15 0551  ALBUMIN 3.9 2.7*    Gastrointestinal Profile: Last BM: 10/31   Nutrition-Focused Physical Exam Findings: Nutrition-Focused physical exam completed. Findings are WDL for fat depletion, muscle depletion, and edema.      Weight Change: stable wt prior to admission    Diet Order:  Diet clear liquid Room service appropriate?: Yes; Fluid consistency:: Thin  Skin:   reviewed   Height:   Ht Readings from Last 1 Encounters:  04/15/15 6' (1.829 m)    Weight:   Wt Readings from Last 1 Encounters:  04/15/15 196 lb 6.4 oz (89.086 kg)     BMI:  Body mass index is 26.63 kg/(m^2).  Estimated Nutritional Needs:   Kcal:  BEE 1563 kcals (IF 1.0-1.2, AF 1.3) 5093-2671 kcals/d  Protein:  (1.1-1.3 g/kg) 98-116 g/d  Fluid:  (30-62ml/kg) 2458-0998 ml/d  EDUCATION NEEDS:   Education needs addressed  MODERATE Care Level  Terea Neubauer B. Zenia Resides, [place], [place] (pager)

## 2015-04-20 NOTE — Discharge Instructions (Signed)
°  May shower in 24 hours. Leave paper strips in place. Resume all home medications. Follow-up with Dr. Adonis Huguenin in 10 days.

## 2015-04-20 NOTE — Progress Notes (Signed)
Pt A and O x 4. VSS. Pt tolerating diet well. No complaints of pain or nausea. IV removed intact, prescriptions given. Pt voiced understanding of discharge instructions with no further questions. Pt discharged via wheelchair with axillary.   

## 2015-04-23 NOTE — Discharge Summary (Signed)
[name] MRN: [id number] DOB/AGE: 27-Aug-1917 81 y.o.  Admit date: 04/15/2015 Discharge date: 04/20/2015  Discharge Diagnoses:  Cholecystitis   Procedures Performed: Laparoscopic Cholecystectomy  Discharged Condition: good  Hospital Course: Admitted from ER with acute cholecystitis. Taken to OR for cholecystectomy. Tolerated surgery with minimal difficulty. Had delayed return of bowel function. Able to tolerate PO by date of discharge.  Discharge Orders: Discharge home  Disposition: 01-Home or Self Care  Discharge Medications: No current facility-administered medications for this encounter.  Current outpatient prescriptions:  .  aspirin EC 81 MG tablet, Take 81 mg by mouth daily., Disp: , Rfl:  .  atorvastatin (LIPITOR) 40 MG tablet, Take 20 mg by mouth at bedtime., Disp: , Rfl:  .  diltiazem (DILACOR XR) 120 MG 24 hr capsule, Take 120 mg by mouth daily., Disp: , Rfl:  .  levothyroxine (SYNTHROID, LEVOTHROID) 100 MCG tablet, Take 100 mcg by mouth daily before breakfast., Disp: , Rfl:  .  tamsulosin (FLOMAX) 0.4 MG CAPS capsule, Take 0.4 mg by mouth daily., Disp: , Rfl:  .  vitamin B-12 (CYANOCOBALAMIN) 1000 MCG tablet, Take 1,000 mcg by mouth daily., Disp: , Rfl:  .  Vitamin D, Ergocalciferol, (DRISDOL) 50000 UNITS CAPS capsule, Take 50,000 Units by mouth every 30 (thirty) days., Disp: , Rfl:  .  zinc oxide 20 % ointment, Apply 1 application topically 2 (two) times daily., Disp: , Rfl:  .  Hydrocodone-Acetaminophen (VICODIN) 5-300 MG TABS, Take 1 tablet by mouth every 4 (four) hours as needed., Disp: 30 each, Rfl: 0  Follwup: Follow-up Information    Follow up with Clayburn Pert, MD. Go on 05/04/2015.   Specialty:  General Surgery   Why:  Monday November 14th at 9:45 a.m. For wound re-check   Contact information:   [address] [place] [address] [phone]       Signed: Clayburn Pert 04/23/2015, 1:43 PM

## 2015-05-04 ENCOUNTER — Ambulatory Visit (INDEPENDENT_AMBULATORY_CARE_PROVIDER_SITE_OTHER): Payer: Medicare Other | Admitting: General Surgery

## 2015-05-04 ENCOUNTER — Encounter: Payer: Self-pay | Admitting: General Surgery

## 2015-05-04 VITALS — BP 136/75 | HR 73 | Temp 97.7°F | Ht 72.0 in | Wt 194.0 lb

## 2015-05-04 DIAGNOSIS — Z4889 Encounter for other specified surgical aftercare: Secondary | ICD-10-CM

## 2015-05-04 NOTE — Patient Instructions (Signed)
Follow up in our office as needed. 

## 2015-05-04 NOTE — Progress Notes (Signed)
Outpatient Surgical Follow Up  05/04/2015  Paul Cross is an 81 y.o. male.   Chief Complaint  Patient presents with  . Routine Post Op    laparoscopic cholecystectomy    HPI: 81 year old male returns to clinic 3 weeks s/p laparoscopic cholecystectomy. Reports doing well. Denies any pain. Eating well and having normal bowel function. Happy with results.  Past Medical History  Diagnosis Date  . Hypothyroidism     approximately 2009  . Hypertension   . Hyperlipidemia     Past Surgical History  Procedure Laterality Date  . Femoral artery repair    . Cholecystectomy N/A 04/16/2015    Procedure: LAPAROSCOPIC CHOLECYSTECTOMY;  Surgeon: Clayburn Pert, MD;  Location: [facility] ORS;  Service: General;  Laterality: N/A;    Family History  Problem Relation Age of Onset  . Cancer Sister     breast  . Diabetes Brother     Social History:  reports that he has quit smoking. He has quit using smokeless tobacco. He reports that he does not drink alcohol or use illicit drugs.  Allergies:  Allergies  Allergen Reactions  . Diphenhydramine Hcl Other (See Comments)    Reaction:  Unknown   . Simvastatin Other (See Comments)    Reaction:  Unknown     Medications reviewed.    ROS  A multisystem review of systems was completed. Operative positives negatives in the history of present illness the remainder negative.  BP 136/75 mmHg  Pulse 73  Temp(Src) 97.7 F (36.5 C) (Oral)  Ht 6' (1.829 m)  Wt 87.998 kg (194 lb)  BMI 26.31 kg/m2  Physical Exam  Gen.: No acute distress Chest: Clear to auscultation Heart: Regular rate and rhythm Abdomen: Soft, nontender, nondistended. Well proximal mid lap scopic cholestatic sites without any evidence of infection.   No results found for this or any previous visit (from the past 48 hour(s)). No results found. Pathology reviewed with patient showing cholecystitis. Assessment/Plan:  1. Aftercare following surgery 81 year old male  3 weeks status post laparoscopic cholecystectomy. Patient doing very well. Discussed signs of infection to any dense incision sites and return to clinic should they occur. Otherwise patient to follow-up on an as-needed basis.     Clayburn Pert  05/04/2015,negative

## 2017-04-18 ENCOUNTER — Encounter: Payer: Self-pay | Admitting: Emergency Medicine

## 2017-04-18 ENCOUNTER — Emergency Department: Payer: Medicare Other

## 2017-04-18 ENCOUNTER — Emergency Department
Admission: EM | Admit: 2017-04-18 | Discharge: 2017-04-18 | Disposition: A | Discharge status: Home / Self Care | Payer: Medicare Other | Attending: Student in an Organized Health Care Education/Training Program | Admitting: Student in an Organized Health Care Education/Training Program

## 2017-04-18 DIAGNOSIS — Z79899 Other long term (current) drug therapy: Secondary | ICD-10-CM | POA: Insufficient documentation

## 2017-04-18 DIAGNOSIS — Z7982 Long term (current) use of aspirin: Secondary | ICD-10-CM | POA: Diagnosis not present

## 2017-04-18 DIAGNOSIS — E039 Hypothyroidism, unspecified: Secondary | ICD-10-CM | POA: Insufficient documentation

## 2017-04-18 DIAGNOSIS — I10 Essential (primary) hypertension: Secondary | ICD-10-CM | POA: Insufficient documentation

## 2017-04-18 DIAGNOSIS — Z87891 Personal history of nicotine dependence: Secondary | ICD-10-CM | POA: Diagnosis not present

## 2017-04-18 DIAGNOSIS — R519 Headache, unspecified: Secondary | ICD-10-CM

## 2017-04-18 DIAGNOSIS — R42 Dizziness and giddiness: Secondary | ICD-10-CM | POA: Insufficient documentation

## 2017-04-18 DIAGNOSIS — R51 Headache: Secondary | ICD-10-CM | POA: Insufficient documentation

## 2017-04-18 LAB — CBC WITH DIFFERENTIAL/PLATELET
BASOS ABS: 0.1 10*3/uL (ref 0–0.1)
Basophils Relative: 1 %
EOS ABS: 0.2 10*3/uL (ref 0–0.7)
Eosinophils Relative: 5 %
HCT: 43.3 % (ref 40.0–52.0)
Hemoglobin: 14.7 g/dL (ref 13.0–18.0)
Lymphocytes Relative: 28 %
Lymphs Abs: 1.3 10*3/uL (ref 1.0–3.6)
MCH: 33.2 pg (ref 26.0–34.0)
MCHC: 33.9 g/dL (ref 32.0–36.0)
MCV: 97.8 fL (ref 80.0–100.0)
MONO ABS: 0.6 10*3/uL (ref 0.2–1.0)
MONOS PCT: 12 %
Neutro Abs: 2.5 10*3/uL (ref 1.4–6.5)
Neutrophils Relative %: 54 %
PLATELETS: 156 10*3/uL (ref 150–440)
RBC: 4.43 MIL/uL (ref 4.40–5.90)
RDW: 14 % (ref 11.5–14.5)
WBC: 4.7 10*3/uL (ref 3.8–10.6)

## 2017-04-18 LAB — BASIC METABOLIC PANEL
ANION GAP: 5 (ref 5–15)
BUN: 24 mg/dL — ABNORMAL HIGH (ref 6–20)
CALCIUM: 9.6 mg/dL (ref 8.9–10.3)
CO2: 27 mmol/L (ref 22–32)
CREATININE: 1.22 mg/dL (ref 0.61–1.24)
Chloride: 105 mmol/L (ref 101–111)
GFR calc Af Amer: 55 mL/min — ABNORMAL LOW (ref >60)
GFR, EST NON AFRICAN AMERICAN: 48 mL/min — AB (ref >60)
Glucose, Bld: 97 mg/dL (ref 65–99)
Potassium: 4.8 mmol/L (ref 3.5–5.1)
SODIUM: 137 mmol/L (ref 135–145)

## 2017-04-18 MED ORDER — SODIUM CHLORIDE 0.9 % IV BOLUS (SEPSIS)
500.0000 mL | Freq: Once | INTRAVENOUS | Status: AC
Start: 2017-04-18 — End: 2017-04-18
  Administered 2017-04-18: 500 mL via INTRAVENOUS

## 2017-04-18 MED ORDER — AMLODIPINE BESYLATE 5 MG PO TABS
5.0000 mg | ORAL_TABLET | Freq: Once | ORAL | Status: AC
  Administered 2017-04-18: 5 mg via ORAL
  Filled 2017-04-18: qty 1

## 2017-04-18 NOTE — ED Provider Notes (Signed)
[facility] Emergency Department Provider Note    None    (approximate)  I have reviewed the triage vital signs and the nursing notes.   HISTORY  Chief Complaint Headache    HPI Paul Cross is a 83 y.o. male presents with chief complaint of pain on the left top of his head as well as feeling of pressure in his head that started this morning.  States that the symptoms were mild to moderate.  States he was also having some lightheadedness when he stood up.  Has not had a headache like this in the past.  This at this moment his headache is resolved and he is not having any discomfort at this time.  Denied any chest pain or shortness of breath.  Did get his flu shot yesterday.  Was recently told to stop taking his Norvasc due to swelling in his legs.  Denies any orthopnea or worsening shortness of breath.  Past Medical History:  Diagnosis Date  . Hyperlipidemia   . Hypertension   . Hypothyroidism    approximately 2009   Family History  Problem Relation Age of Onset  . Cancer Sister        breast  . Diabetes Brother    Past Surgical History:  Procedure Laterality Date  . CHOLECYSTECTOMY N/A 04/16/2015   Procedure: LAPAROSCOPIC CHOLECYSTECTOMY;  Surgeon: Clayburn Pert, MD;  Location: [facility] ORS;  Service: General;  Laterality: N/A;  . FEMORAL ARTERY REPAIR     Patient Active Problem List   Diagnosis Date Noted  . Aftercare following surgery 05/04/2015      Prior to Admission medications   Medication Sig Start Date End Date Taking? Authorizing Provider  aspirin EC 81 MG tablet Take 81 mg by mouth daily.    [provider]  atorvastatin (LIPITOR) 40 MG tablet Take 20 mg by mouth at bedtime.    [provider]  diltiazem (DILACOR XR) 120 MG 24 hr capsule Take 120 mg by mouth daily.    [provider]  levothyroxine (SYNTHROID, LEVOTHROID) 100 MCG tablet Take 100 mcg by mouth daily before breakfast.    [provider]  tamsulosin (FLOMAX) 0.4 MG CAPS capsule Take 0.4 mg by mouth daily.    [provider]  vitamin B-12 (CYANOCOBALAMIN) 1000 MCG tablet Take 1,000 mcg by mouth daily.    [provider]  Vitamin D, Ergocalciferol, (DRISDOL) 50000 UNITS CAPS capsule Take 50,000 Units by mouth every 30 (thirty) days.    [provider]  zinc oxide 20 % ointment Apply 1 application topically 2 (two) times daily.    [provider]    Allergies Diphenhydramine hcl and Simvastatin    Social History Social History  Substance Use Topics  . Smoking status: Former Research scientist (life sciences)  . Smokeless tobacco: Former Systems developer  . Alcohol use No    Review of Systems Patient denies headaches, rhinorrhea, blurry vision, numbness, shortness of breath, chest pain, edema, cough, abdominal pain, nausea, vomiting, diarrhea, dysuria, fevers, rashes or hallucinations unless otherwise stated above in HPI. ____________________________________________   PHYSICAL EXAM:  VITAL SIGNS: Vitals:   04/18/17 2130 04/18/17 2238  BP: (!) 176/140 (!) 163/99  Pulse: 66 71  Resp: 13 18  Temp:    SpO2: 92% 92%    Constitutional: Alert and oriented. Well and much  appearing and in no acute distress. Eyes: Conjunctivae are normal.  Head: Atraumatic. Nose: No congestion/rhinnorhea. Mouth/Throat: Mucous membranes are moist.   Neck: No  stridor. Painless ROM.  Cardiovascular: Normal rate, regular rhythm. Grossly normal heart sounds.  Good peripheral circulation. Respiratory: Normal respiratory effort.  No retractions. Lungs CTAB. Gastrointestinal: Soft and nontender. No distention. No abdominal bruits. No CVA tenderness. Genitourinary:  Musculoskeletal: No lower extremity tenderness nor edema.  No joint effusions. Neurologic:  CN- intact.  No facial droop, Normal FNF.  Normal heel to shin.  Sensation intact bilaterally. Normal speech and language. No gross focal neurologic deficits are appreciated.  No gait instability. Skin:  Skin is warm, dry and intact. No rash noted. Psychiatric: Mood and affect are normal. Speech and behavior are normal.  ____________________________________________   LABS (all labs ordered are listed, but only abnormal results are displayed)  Results for orders placed or performed during the hospital encounter of 04/18/17 (from the past 24 hour(s))  CBC with Differential     Status: None   Collection Time: 04/18/17  5:42 PM  Result Value Ref Range   WBC 4.7 3.8 - 10.6 K/uL   RBC 4.43 4.40 - 5.90 MIL/uL   Hemoglobin 14.7 13.0 - 18.0 g/dL   HCT 43.3 40.0 - 52.0 %   MCV 97.8 80.0 - 100.0 fL   MCH 33.2 26.0 - 34.0 pg   MCHC 33.9 32.0 - 36.0 g/dL   RDW 14.0 11.5 - 14.5 %   Platelets 156 150 - 440 K/uL   Neutrophils Relative % 54 %   Neutro Abs 2.5 1.4 - 6.5 K/uL   Lymphocytes Relative 28 %   Lymphs Abs 1.3 1.0 - 3.6 K/uL   Monocytes Relative 12 %   Monocytes Absolute 0.6 0.2 - 1.0 K/uL   Eosinophils Relative 5 %   Eosinophils Absolute 0.2 0 - 0.7 K/uL   Basophils Relative 1 %   Basophils Absolute 0.1 0 - 0.1 K/uL  Basic metabolic panel     Status: Abnormal   Collection Time: 04/18/17  5:42 PM  Result Value Ref Range   Sodium 137 135 - 145 mmol/L   Potassium 4.8 3.5 - 5.1 mmol/L   Chloride 105 101 - 111 mmol/L   CO2 27 22 - 32 mmol/L   Glucose, Bld 97 65 - 99 mg/dL   BUN 24 (H) 6 - 20 mg/dL   Creatinine, Ser 1.22 0.61 - 1.24 mg/dL   Calcium 9.6 8.9 - 10.3 mg/dL   GFR calc non Af Amer 48 (L) >60 mL/min   GFR calc Af Amer 55 (L) >60 mL/min   Anion gap 5 5 - 15   ____________________________________________  EKG My review and personal interpretation at Time: 21:10   Indication: htn  Rate: 70  Rhythm: sinus Axis: left Other: lbbb, no sgarbossa, normal intervals ____________________________________________  RADIOLOGY  I personally reviewed all radiographic images ordered to evaluate for the above acute complaints and reviewed radiology reports  and findings.  These findings were personally discussed with the patient.  Please see medical record for radiology report.  ____________________________________________   PROCEDURES  Procedure(s) performed:  Procedures    Critical Care performed: no ____________________________________________   INITIAL IMPRESSION / ASSESSMENT AND PLAN / ED COURSE  Pertinent labs & imaging results that were available during my care of the patient were reviewed by me and considered in my medical decision making (see chart for details).  DDX: iph, tia, cva, unlikely SAH, hypertensive urgency, mass,   Paul Cross is a 83 y.o. who presents to the ED with elevated blood pressure and headache earlier today that seems to have resolved.  Has a nonfocal neuro exam.  CT head shows no acute abnormality.  EKG shows no acute ischemia patient denies any chest pain or shortness of breath.  Will order MRI to further evaluate any evidence of TIA bleed or mass.  Does not seem clinically consistent with subarachnoid as he has no meningeal irritation, there is no thunderclap headache or severe discomfort.  Given his dizziness will give small IV boluses it may be component of orthostasis or dehydration and will continue to monitor.  Clinical Course as of Apr 18 2250  Tue Apr 18, 2017  2249 Patient reassessed.  Remains pain-free with no discomfort and repeat neuro exam is nonfocal.  Discussed results of MRI and chronic findings but no evidence of acute infarct or ischemia.  This not clinically consistent with subarachnoid bleed.  No evidence of mass.  Patient able to ambulate with a steady gait.  Possible component of hypertension induced headache but certainly no evidence of infectious process.  Did discuss admission the hospital for further evaluation and observation but patient is asymptomatic and feels comfortable being discharged home which I think is reasonable.  Will give referral to neurology.  Have discussed  with the patient and available family all diagnostics and treatments performed thus far and all questions were answered to the best of my ability. The patient demonstrates understanding and agreement with plan.   [PR]    Clinical Course User Index [PR] Merlyn Lot, MD     ____________________________________________   FINAL CLINICAL IMPRESSION(S) / ED DIAGNOSES  Final diagnoses:  Hypertension, unspecified type  Acute nonintractable headache, unspecified headache type      NEW MEDICATIONS STARTED DURING THIS VISIT:  New Prescriptions   No medications on file     Note:  This document was prepared using Dragon voice recognition software and may include unintentional dictation errors.    Merlyn Lot, MD 04/18/17 [phone]

## 2017-04-18 NOTE — ED Triage Notes (Addendum)
Pt c/o pain to left side of head. "feels like my eye balls are sore". Denies vision changes.  Feels numb feeling to left side of head (small area top left) but no where else. No blood thinners. Sx started sometime this morning but pt not sure when.  Also has felt off balance today. No weakness to either extremity noted. Speech clear. No facial droop. Describes pain to head as dull.

## 2017-04-18 NOTE — Discharge Instructions (Signed)

## 2017-04-18 NOTE — ED Notes (Signed)
Discussed pt with dr Archie Balboa,

## 2017-04-18 NOTE — ED Notes (Signed)
Pt in MRI.

## 2018-01-01 ENCOUNTER — Emergency Department: Payer: Medicare Other

## 2018-01-01 ENCOUNTER — Other Ambulatory Visit: Payer: Self-pay

## 2018-01-01 ENCOUNTER — Inpatient Hospital Stay: Payer: Medicare Other

## 2018-01-01 ENCOUNTER — Inpatient Hospital Stay
Admission: EM | Admit: 2018-01-01 | Discharge: 2018-01-03 | DRG: 189 | Disposition: A | Discharge status: Home Health | Payer: Medicare Other | Attending: Specialist | Admitting: Specialist

## 2018-01-01 DIAGNOSIS — I455 Other specified heart block: Secondary | ICD-10-CM | POA: Diagnosis present

## 2018-01-01 DIAGNOSIS — F17210 Nicotine dependence, cigarettes, uncomplicated: Secondary | ICD-10-CM | POA: Diagnosis present

## 2018-01-01 DIAGNOSIS — N400 Enlarged prostate without lower urinary tract symptoms: Secondary | ICD-10-CM | POA: Diagnosis present

## 2018-01-01 DIAGNOSIS — E782 Mixed hyperlipidemia: Secondary | ICD-10-CM | POA: Diagnosis present

## 2018-01-01 DIAGNOSIS — J9690 Respiratory failure, unspecified, unspecified whether with hypoxia or hypercapnia: Secondary | ICD-10-CM | POA: Diagnosis present

## 2018-01-01 DIAGNOSIS — I5032 Chronic diastolic (congestive) heart failure: Secondary | ICD-10-CM | POA: Diagnosis present

## 2018-01-01 DIAGNOSIS — K5900 Constipation, unspecified: Secondary | ICD-10-CM | POA: Diagnosis present

## 2018-01-01 DIAGNOSIS — J439 Emphysema, unspecified: Secondary | ICD-10-CM | POA: Diagnosis present

## 2018-01-01 DIAGNOSIS — I5030 Unspecified diastolic (congestive) heart failure: Secondary | ICD-10-CM | POA: Diagnosis not present

## 2018-01-01 DIAGNOSIS — R918 Other nonspecific abnormal finding of lung field: Secondary | ICD-10-CM | POA: Diagnosis present

## 2018-01-01 DIAGNOSIS — Z7982 Long term (current) use of aspirin: Secondary | ICD-10-CM | POA: Diagnosis not present

## 2018-01-01 DIAGNOSIS — I447 Left bundle-branch block, unspecified: Secondary | ICD-10-CM | POA: Diagnosis present

## 2018-01-01 DIAGNOSIS — Z9981 Dependence on supplemental oxygen: Secondary | ICD-10-CM

## 2018-01-01 DIAGNOSIS — J8410 Pulmonary fibrosis, unspecified: Secondary | ICD-10-CM | POA: Diagnosis present

## 2018-01-01 DIAGNOSIS — Z888 Allergy status to other drugs, medicaments and biological substances status: Secondary | ICD-10-CM

## 2018-01-01 DIAGNOSIS — I110 Hypertensive heart disease with heart failure: Secondary | ICD-10-CM | POA: Diagnosis present

## 2018-01-01 DIAGNOSIS — J9601 Acute respiratory failure with hypoxia: Principal | ICD-10-CM | POA: Diagnosis present

## 2018-01-01 DIAGNOSIS — I441 Atrioventricular block, second degree: Secondary | ICD-10-CM | POA: Diagnosis not present

## 2018-01-01 DIAGNOSIS — E039 Hypothyroidism, unspecified: Secondary | ICD-10-CM | POA: Diagnosis present

## 2018-01-01 LAB — TROPONIN I: Troponin I: 0.03 ng/mL (ref <0.03)

## 2018-01-01 LAB — BASIC METABOLIC PANEL
ANION GAP: 9 (ref 5–15)
BUN: 21 mg/dL (ref 8–23)
CALCIUM: 9.5 mg/dL (ref 8.9–10.3)
CO2: 27 mmol/L (ref 22–32)
CREATININE: 1.23 mg/dL (ref 0.61–1.24)
Chloride: 103 mmol/L (ref 98–111)
GFR calc Af Amer: 55 mL/min — ABNORMAL LOW (ref >60)
GFR, EST NON AFRICAN AMERICAN: 47 mL/min — AB (ref >60)
Glucose, Bld: 143 mg/dL — ABNORMAL HIGH (ref 70–99)
Potassium: 4.5 mmol/L (ref 3.5–5.1)
Sodium: 139 mmol/L (ref 135–145)

## 2018-01-01 LAB — BLOOD GAS, ARTERIAL
ACID-BASE EXCESS: 1.3 mmol/L (ref 0.0–2.0)
Bicarbonate: 25 mmol/L (ref 20.0–28.0)
FIO2: 28
O2 SAT: 89.7 %
PATIENT TEMPERATURE: 37
PO2 ART: 55 mmHg — AB (ref 83.0–108.0)
pCO2 arterial: 36 mmHg (ref 32.0–48.0)
pH, Arterial: 7.45 (ref 7.350–7.450)

## 2018-01-01 LAB — CBC
HCT: 42.9 % (ref 40.0–52.0)
Hemoglobin: 14.6 g/dL (ref 13.0–18.0)
MCH: 33.1 pg (ref 26.0–34.0)
MCHC: 34 g/dL (ref 32.0–36.0)
MCV: 97.3 fL (ref 80.0–100.0)
PLATELETS: 181 10*3/uL (ref 150–440)
RBC: 4.41 MIL/uL (ref 4.40–5.90)
RDW: 13.5 % (ref 11.5–14.5)
WBC: 6.8 10*3/uL (ref 3.8–10.6)

## 2018-01-01 LAB — URINALYSIS, COMPLETE (UACMP) WITH MICROSCOPIC
Bacteria, UA: NONE SEEN
Bilirubin Urine: NEGATIVE
Glucose, UA: NEGATIVE mg/dL
HGB URINE DIPSTICK: NEGATIVE
Ketones, ur: NEGATIVE mg/dL
Leukocytes, UA: NEGATIVE
Nitrite: NEGATIVE
PROTEIN: 100 mg/dL — AB
SPECIFIC GRAVITY, URINE: 1.018 (ref 1.005–1.030)
pH: 7 (ref 5.0–8.0)

## 2018-01-01 LAB — BRAIN NATRIURETIC PEPTIDE: B Natriuretic Peptide: 107 pg/mL — ABNORMAL HIGH (ref 0.0–100.0)

## 2018-01-01 LAB — TSH: TSH: 0.377 u[IU]/mL (ref 0.350–4.500)

## 2018-01-01 LAB — MAGNESIUM: Magnesium: 2.1 mg/dL (ref 1.7–2.4)

## 2018-01-01 MED ORDER — ASPIRIN EC 81 MG PO TBEC
81.0000 mg | DELAYED_RELEASE_TABLET | Freq: Every day | ORAL | Status: DC
  Administered 2018-01-02 – 2018-01-03 (×2): 81 mg via ORAL
  Filled 2018-01-01 (×2): qty 1

## 2018-01-01 MED ORDER — SODIUM CHLORIDE 0.9 % IV SOLN: 250.0000 mL | INTRAVENOUS | Status: DC | PRN

## 2018-01-01 MED ORDER — BISACODYL 10 MG RE SUPP: 10.0000 mg | Freq: Every day | RECTAL | Status: DC | PRN

## 2018-01-01 MED ORDER — FLEET ENEMA 7-19 GM/118ML RE ENEM: 1.0000 | ENEMA | Freq: Once | RECTAL | Status: DC | PRN

## 2018-01-01 MED ORDER — LEVOTHYROXINE SODIUM 137 MCG PO TABS
137.0000 ug | ORAL_TABLET | Freq: Every day | ORAL | Status: DC
  Administered 2018-01-02 – 2018-01-03 (×2): 137 ug via ORAL
  Filled 2018-01-01 (×2): qty 1

## 2018-01-01 MED ORDER — LACTULOSE 10 GM/15ML PO SOLN
30.0000 g | Freq: Two times a day (BID) | ORAL | Status: AC
  Administered 2018-01-01 – 2018-01-02 (×2): 30 g via ORAL
  Filled 2018-01-01 (×2): qty 60

## 2018-01-01 MED ORDER — TRAZODONE HCL 50 MG PO TABS
50.0000 mg | ORAL_TABLET | Freq: Every evening | ORAL | Status: DC | PRN
Start: 2018-01-01 — End: 2018-01-03
  Administered 2018-01-01: 50 mg via ORAL
  Filled 2018-01-01: qty 1

## 2018-01-01 MED ORDER — ONDANSETRON HCL 4 MG PO TABS: 4.0000 mg | ORAL_TABLET | Freq: Four times a day (QID) | ORAL | Status: DC | PRN

## 2018-01-01 MED ORDER — ATORVASTATIN CALCIUM 20 MG PO TABS
20.0000 mg | ORAL_TABLET | Freq: Every day | ORAL | Status: DC
  Administered 2018-01-01 – 2018-01-02 (×2): 20 mg via ORAL
  Filled 2018-01-01: qty 1

## 2018-01-01 MED ORDER — ACETAMINOPHEN 650 MG RE SUPP: 650.0000 mg | Freq: Four times a day (QID) | RECTAL | Status: DC | PRN

## 2018-01-01 MED ORDER — POLYETHYLENE GLYCOL 3350 17 G PO PACK: 17.0000 g | PACK | Freq: Every day | ORAL | Status: DC | PRN

## 2018-01-01 MED ORDER — SODIUM CHLORIDE 0.9% FLUSH
3.0000 mL | Freq: Two times a day (BID) | INTRAVENOUS | Status: DC
  Administered 2018-01-01 – 2018-01-03 (×4): 3 mL via INTRAVENOUS

## 2018-01-01 MED ORDER — ADULT MULTIVITAMIN W/MINERALS CH
1.0000 | ORAL_TABLET | Freq: Every day | ORAL | Status: DC
  Administered 2018-01-02 – 2018-01-03 (×2): 1 via ORAL
  Filled 2018-01-01 (×2): qty 1

## 2018-01-01 MED ORDER — ONDANSETRON HCL 4 MG/2ML IJ SOLN: 4.0000 mg | Freq: Four times a day (QID) | INTRAMUSCULAR | Status: DC | PRN

## 2018-01-01 MED ORDER — SODIUM CHLORIDE 0.9% FLUSH
3.0000 mL | Freq: Two times a day (BID) | INTRAVENOUS | Status: DC
  Administered 2018-01-02 – 2018-01-03 (×2): 3 mL via INTRAVENOUS

## 2018-01-01 MED ORDER — TRIAMCINOLONE ACETONIDE 0.1 % EX OINT
1.0000 "application " | TOPICAL_OINTMENT | Freq: Two times a day (BID) | CUTANEOUS | Status: DC
  Administered 2018-01-02: 1 via TOPICAL
  Filled 2018-01-01 (×2): qty 15

## 2018-01-01 MED ORDER — SODIUM CHLORIDE 0.9% FLUSH: 3.0000 mL | INTRAVENOUS | Status: DC | PRN

## 2018-01-01 MED ORDER — MORPHINE SULFATE (PF) 2 MG/ML IV SOLN: 2.0000 mg | INTRAVENOUS | Status: DC | PRN

## 2018-01-01 MED ORDER — FUROSEMIDE 10 MG/ML IJ SOLN
20.0000 mg | Freq: Two times a day (BID) | INTRAMUSCULAR | Status: DC
  Administered 2018-01-01 – 2018-01-02 (×2): 20 mg via INTRAVENOUS
  Filled 2018-01-01: qty 2

## 2018-01-01 MED ORDER — AMLODIPINE BESYLATE 5 MG PO TABS
5.0000 mg | ORAL_TABLET | Freq: Every day | ORAL | Status: DC
  Administered 2018-01-02 – 2018-01-03 (×2): 5 mg via ORAL
  Filled 2018-01-01 (×2): qty 1

## 2018-01-01 MED ORDER — VITAMIN B-12 1000 MCG PO TABS
1000.0000 ug | ORAL_TABLET | Freq: Every day | ORAL | Status: DC
  Administered 2018-01-01 – 2018-01-03 (×3): 1000 ug via ORAL
  Filled 2018-01-01 (×2): qty 1

## 2018-01-01 MED ORDER — TAMSULOSIN HCL 0.4 MG PO CAPS
0.4000 mg | ORAL_CAPSULE | Freq: Every day | ORAL | Status: DC
  Administered 2018-01-01 – 2018-01-03 (×3): 0.4 mg via ORAL
  Filled 2018-01-01 (×3): qty 1

## 2018-01-01 MED ORDER — ENOXAPARIN SODIUM 40 MG/0.4ML ~~LOC~~ SOLN
40.0000 mg | SUBCUTANEOUS | Status: DC
  Administered 2018-01-01 – 2018-01-02 (×2): 40 mg via SUBCUTANEOUS
  Filled 2018-01-01 (×2): qty 0.4

## 2018-01-01 MED ORDER — ASPIRIN EC 81 MG PO TBEC: 81.0000 mg | DELAYED_RELEASE_TABLET | Freq: Every day | ORAL | Status: DC

## 2018-01-01 MED ORDER — IPRATROPIUM-ALBUTEROL 0.5-2.5 (3) MG/3ML IN SOLN
3.0000 mL | Freq: Four times a day (QID) | RESPIRATORY_TRACT | Status: DC | PRN
  Administered 2018-01-01: 3 mL via RESPIRATORY_TRACT
  Filled 2018-01-01: qty 3

## 2018-01-01 MED ORDER — ACETAMINOPHEN 325 MG PO TABS: 650.0000 mg | ORAL_TABLET | Freq: Four times a day (QID) | ORAL | Status: DC | PRN

## 2018-01-01 MED ORDER — HYDROCODONE-ACETAMINOPHEN 5-325 MG PO TABS: 1.0000 | ORAL_TABLET | ORAL | Status: DC | PRN

## 2018-01-01 MED ORDER — ZINC OXIDE 40 % EX OINT
1.0000 "application " | TOPICAL_OINTMENT | Freq: Two times a day (BID) | CUTANEOUS | Status: DC
  Administered 2018-01-02: 1 via TOPICAL
  Filled 2018-01-01 (×2): qty 28.35

## 2018-01-01 MED ORDER — NITROGLYCERIN 0.4 MG SL SUBL: 0.4000 mg | SUBLINGUAL_TABLET | SUBLINGUAL | Status: DC | PRN

## 2018-01-01 MED ORDER — BUDESONIDE 0.5 MG/2ML IN SUSP
0.5000 mg | Freq: Two times a day (BID) | RESPIRATORY_TRACT | Status: DC
  Administered 2018-01-01 – 2018-01-02 (×2): 0.5 mg via RESPIRATORY_TRACT
  Filled 2018-01-01 (×2): qty 2

## 2018-01-01 NOTE — Progress Notes (Signed)
Family Meeting Note  Advance Directive:yes  Today a meeting took place with the Patient.  Patient is able to participate  The following clinical team members were present during this meeting:MD  The following were discussed:Patient's diagnosis: , Patient's progosis: Unable to determine and Goals for treatment: Full Code  Additional follow-up to be provided: prn respiratory failure, abnormal EKG  Time spent during discussion:20 minutes  Gorden Harms, MD

## 2018-01-01 NOTE — ED Notes (Signed)
ED Provider at bedside. 

## 2018-01-01 NOTE — H&P (Signed)
[facility] [place] NAME: Paul Cross    MR#:  [id number]  DATE OF BIRTH:  07-23-1917  DATE OF ADMISSION:  01/01/2018  PRIMARY CARE PHYSICIAN: [facility] [place]   REQUESTING/REFERRING PHYSICIAN:   CHIEF COMPLAINT:   Sob abd pain  HISTORY OF PRESENT ILLNESS: Paul Cross  is a 84 y.o. male with a known history per below was admitted to the emergency room with acute right-sided abdominal pain that started early this morning, woke patient up from sleep, patient also had complaints of generalized weakness/fatigue, intermittent shortness of breath chronically, dyspnea on exertion, intermittent leg swelling, unable to sleep last night due to " sensation of impending doom", denies shortness of breath while lying flat, also had some lightheadedness, ER work-up noted for chronic interstitial lung disease on chest x-ray, EKG noted for type II AV block, ED attending did discuss case with cardiology/Dr. Nehemiah Massed recommended admission for further evaluation, patient evaluated in the emergency room, daughter at the bedside, patient in no apparent distresswhom, resting comfortably in bed, currently on oxygen-not on oxygen at home, intermittent O2 saturation down into the 80s despite oxygen, patient and the patient's daughter state that he never had any chest pain, patient states that he had some abdominal discomfort that he thinks is due to constipation-he has not had a bowel movement in 2 days which is unusual for him, patient denies any pain now, patient is now been admitted for acute hypoxic respiratory failure, abnormal EKG.  PAST MEDICAL HISTORY:   Past Medical History:  Diagnosis Date  . Hyperlipidemia   . Hypertension   . Hypothyroidism    approximately 2009    PAST SURGICAL HISTORY:  Past Surgical History:  Procedure Laterality Date  . CHOLECYSTECTOMY N/A 04/16/2015   Procedure: LAPAROSCOPIC CHOLECYSTECTOMY;  Surgeon: Clayburn Pert, MD;  Location: [facility] ORS;  Service: General;  Laterality: N/A;  . FEMORAL ARTERY REPAIR      SOCIAL HISTORY:  Social History   Tobacco Use  . Smoking status: Former Research scientist (life sciences)  . Smokeless tobacco: Former Network engineer Use Topics  . Alcohol use: No    FAMILY HISTORY:  Family History  Problem Relation Age of Onset  . Cancer Sister        breast  . Diabetes Brother     DRUG ALLERGIES:  Allergies  Allergen Reactions  . Diphenhydramine Hcl Other (See Comments)    Reaction:  Unknown   . Simvastatin Other (See Comments)    Reaction:  Unknown     REVIEW OF SYSTEMS:   CONSTITUTIONAL: No fever, fatigue or weakness.  EYES: No blurred or double vision.  EARS, NOSE, AND THROAT: No tinnitus or ear pain.  RESPIRATORY: No cough, shortness of breath, wheezing or hemoptysis.  CARDIOVASCULAR: No chest pain, orthopnea, edema.  GASTROINTESTINAL: No nausea, vomiting, diarrhea or abdominal pain.  GENITOURINARY: No dysuria, hematuria.  ENDOCRINE: No polyuria, nocturia,  HEMATOLOGY: No anemia, easy bruising or bleeding SKIN: No rash or lesion. MUSCULOSKELETAL: No joint pain or arthritis.   NEUROLOGIC: No tingling, numbness, weakness.  PSYCHIATRY: No anxiety or depression.   MEDICATIONS AT HOME:  Prior to Admission medications   Medication Sig Start Date End Date Taking? Authorizing Provider  amLODipine (NORVASC) 5 MG tablet Take 5 mg by mouth daily.   Yes [provider]  aspirin EC 81 MG tablet Take 81 mg by mouth daily.   Yes [provider]  atorvastatin (LIPITOR) 40 MG tablet Take 20 mg  by mouth at bedtime.   Yes [provider]  hydrocortisone cream 1 % Apply 1 application topically 2 (two) times daily. FOR TAILBONE   Yes [provider]  levothyroxine (SYNTHROID, LEVOTHROID) 137 MCG tablet Take 137 mcg by mouth daily before breakfast.    Yes [provider]  Multiple Vitamin (MULTIVITAMIN) capsule Take 1 capsule by mouth daily.    Yes [provider]  polyethylene glycol (MIRALAX / GLYCOLAX) packet Take 17 g by mouth daily as needed for mild constipation.   Yes [provider]  tamsulosin (FLOMAX) 0.4 MG CAPS capsule Take 0.4 mg by mouth daily.   Yes [provider]  triamcinolone ointment (KENALOG) 0.1 % Apply 1 application topically 2 (two) times daily.   Yes [provider]  vitamin B-12 (CYANOCOBALAMIN) 1000 MCG tablet Take 1,000 mcg by mouth daily.   Yes [provider]  zinc oxide 20 % ointment Apply 1 application topically 2 (two) times daily.   Yes [provider]      PHYSICAL EXAMINATION:   VITAL SIGNS: Blood pressure (!) 142/74, pulse 81, temperature 98.2 F (36.8 C), temperature source Oral, resp. rate (!) 25, height 6' (1.829 m), weight 86.2 kg (190 lb), SpO2 97 %.  GENERAL:  84 y.o.-year-old patient lying in the bed with no acute distress.  Frail, nontoxic appearing EYES: Pupils equal, round, reactive to light and accommodation. No scleral icterus. Extraocular muscles intact.  HEENT: Head atraumatic, normocephalic. Oropharynx and nasopharynx clear.  NECK:  Supple, no jugular venous distention. No thyroid enlargement, no tenderness.  LUNGS: Rales up to mid back. No use of accessory muscles of respiration.  CARDIOVASCULAR: S1, S2 normal. No murmurs, rubs, or gallops.  ABDOMEN: Soft, nontender, nondistended. Bowel sounds present. No organomegaly or mass.  EXTREMITIES: Bilateral lower extremity edema, no cyanosis, or clubbing.  NEUROLOGIC: Cranial nerves II through XII are intact. Muscle strength 5/5 in all extremities. Sensation intact. Gait not checked.  PSYCHIATRIC: The patient is alert and oriented x 3.  SKIN: No obvious rash, lesion, or ulcer.   LABORATORY PANEL:   CBC Recent Labs  Lab 01/01/18 1607  WBC 6.8  HGB 14.6  HCT 42.9  PLT 181  MCV 97.3  MCH 33.1  MCHC 34.0  RDW 13.5    ------------------------------------------------------------------------------------------------------------------  Chemistries  Recent Labs  Lab 01/01/18 1607  NA 139  K 4.5  CL 103  CO2 27  GLUCOSE 143*  BUN 21  CREATININE 1.23  CALCIUM 9.5  MG 2.1   ------------------------------------------------------------------------------------------------------------------ estimated creatinine clearance is 37.7 mL/min (by C-G formula based on SCr of 1.23 mg/dL). ------------------------------------------------------------------------------------------------------------------ Recent Labs    01/01/18 1607  TSH 0.377     Coagulation profile No results for input(s): INR, PROTIME in the last 168 hours. ------------------------------------------------------------------------------------------------------------------- No results for input(s): DDIMER in the last 72 hours. -------------------------------------------------------------------------------------------------------------------  Cardiac Enzymes Recent Labs  Lab 01/01/18 1607  TROPONINI <0.03   ------------------------------------------------------------------------------------------------------------------ Invalid input(s): POCBNP  ---------------------------------------------------------------------------------------------------------------  Urinalysis    Component Value Date/Time   COLORURINE YELLOW (A) 01/01/2018 1639   APPEARANCEUR HAZY (A) 01/01/2018 1639   LABSPEC 1.018 01/01/2018 1639   PHURINE 7.0 01/01/2018 1639   GLUCOSEU NEGATIVE 01/01/2018 1639   HGBUR NEGATIVE 01/01/2018 1639   BILIRUBINUR NEGATIVE 01/01/2018 1639   KETONESUR NEGATIVE 01/01/2018 1639   PROTEINUR 100 (A) 01/01/2018 1639   NITRITE NEGATIVE 01/01/2018 1639   LEUKOCYTESUR NEGATIVE 01/01/2018 1639     RADIOLOGY: Dg Chest Portable 1 View  Result Date: 01/01/2018 CLINICAL  DATA:  Chest pain. EXAM: PORTABLE CHEST 1 VIEW COMPARISON:   One-view chest x-ray 04/18/2017 FINDINGS: Heart is mildly enlarged. Chronic interstitial coarsening is again seen. Defibrillator pad is in place. No superimposed edema or effusion is present. No focal airspace disease is present. Degenerative changes are noted in the lower cervical spine and bilateral shoulders. IMPRESSION: 1. Stable chronic interstitial coarsening of both lungs. 2. No superimposed disease.  No acute cardiopulmonary disease. Electronically Signed   By: San Morelle M.D.   On: 01/01/2018 16:37    EKG: Orders placed or performed during the hospital encounter of 01/01/18  . EKG 12-Lead  . EKG 12-Lead    IMPRESSION AND PLAN: *Acute hypoxic respiratory failure Exact etiology unknown-noted chronic history of remote smoking (per recordsCOPD-he and his daughter deny this diagnosis), ? CHF vs COPD Admit to telemetry floor, rule out acute coronary syndrome with cardiac enzymes x3 sets, cardiology for expert opinion given abnormal EKG noted for Mobitz type II AV block, check echocardiogram, BNP, supplemental oxygen weaning as tolerated, aspirin, IV morphine PRN chest pain, nitrates as needed, check ABG, Pulmicort twice daily, avoid IV steroids at this time, check CT chest without contrast to evaluate lung architecture, consult pulmonology for expert opinion-would consider pulmonary function testing status post discharge, consult pulmonology expert opinion given abnormal chest x-ray, strict I&O monitoring, daily weights, continue close medical monitoring  *Acute Mobitz type II AV block Case discussed between ED attending and Dr. Baldo Daub to telemetry bed, rule out acute coronary syndrome as stated above, echocardiogram, avoid AV nodal blocking agents, and continue close medical monitoring  *Acute abnormal chest x-ray Plan of care as stated above  *Chronic hypothyroidism Stable Continue Synthroid  *Chronic hypertension Stable  continue home regiment    All the records  are reviewed and case discussed with ED provider. Management plans discussed with the patient, family and they are in agreement.  CODE STATUS:full Code Status History    Date Active Date Inactive Code Status Order ID Comments User Context   04/15/2015 1726 04/20/2015 1710 Full Code [id number]  Clayburn Pert, MD Inpatient       TOTAL TIME TAKING CARE OF THIS PATIENT: 45 minutes.    Avel Peace Zury Fazzino M.D on 01/01/2018   Between 7am to 6pm - Pager - [phone]  After 6pm go to [email or web address] - password [email or web address] [facility]  Office  [phone]  CC: Primary care physician; [place]   Note: This dictation was prepared with Dragon dictation along with smaller phrase technology. Any transcriptional errors that result from this process are unintentional.

## 2018-01-01 NOTE — ED Notes (Signed)
Pt brought in by daughter.  Pt reports  Waking up this morning not feeling well.  Chest pain eariler today.  No chest pain or tightness now.  Pt has sob.  Pt reports feeling tired today.  Pt alert.  Skin warm and dry.  Family with pt.  Iv started and cardiac pads in place.

## 2018-01-01 NOTE — ED Notes (Signed)
Report called to [place].

## 2018-01-01 NOTE — ED Triage Notes (Addendum)
Pt presents today for chest epigastric pain that started last night. Pt is weak balance off. Pt is afebrile but labored in breathe.   Pt presents today with daughter as he states he could not sleep last night but maybe 3 hours and that when he woke up he had "the impending feeling of doom" Pt states pain in mid sternum and epigastric at this time.

## 2018-01-01 NOTE — ED Notes (Signed)
Pt alert.  Iv in place.  Family with pt.

## 2018-01-01 NOTE — ED Notes (Signed)
2nd degree block on monitor. Pt alert.  Family with pt.  No chest pain.  Skin warm and dry.   Iv in place.

## 2018-01-01 NOTE — ED Notes (Signed)
Pt waiting on admission.  Pt alert  

## 2018-01-01 NOTE — ED Provider Notes (Signed)
[facility] Emergency Department Provider Note  ____________________________________________  Time seen: Approximately 4:27 PM  I have reviewed the triage vital signs and the nursing notes.   HISTORY  Chief Complaint Chest Pain   HPI Paul Cross is a 84 y.o. male with a history of hypertension, hypothyroidism, and hyperlipidemia who presents for evaluation of generalized weakness.  Patient reports 2 days of constant generalized weakness, episodes of lightheadedness, and shortness of breath. Has not been able to sleep at night due to these symptoms.  Patient was trying to stay home to see if the symptoms would go away however this morning when he woke up he reports having the sensation of "impending doom" which made him call his daughter and she brought him to the emergency room.  He denies chest pain even though triage note says that he has been having chest pain.  He reports that he just not feel well.  No nausea or vomiting, no abdominal pain, no fever or chills.  Patient denies any prior cardiac history.  Endorses compliance with his medications.    Past Medical History:  Diagnosis Date  . Hyperlipidemia   . Hypertension   . Hypothyroidism    approximately 2009    Patient Active Problem List   Diagnosis Date Noted  . Respiratory failure ([place]) 01/01/2018  . Aftercare following surgery 05/04/2015    Past Surgical History:  Procedure Laterality Date  . CHOLECYSTECTOMY N/A 04/16/2015   Procedure: LAPAROSCOPIC CHOLECYSTECTOMY;  Surgeon: Clayburn Pert, MD;  Location: [facility] ORS;  Service: General;  Laterality: N/A;  . FEMORAL ARTERY REPAIR      Prior to Admission medications   Medication Sig Start Date End Date Taking? Authorizing Provider  amLODipine (NORVASC) 5 MG tablet Take 5 mg by mouth daily.   Yes [provider]  aspirin EC 81 MG tablet Take 81 mg by mouth daily.   Yes [provider]  atorvastatin (LIPITOR) 40 MG  tablet Take 20 mg by mouth at bedtime.   Yes [provider]  hydrocortisone cream 1 % Apply 1 application topically 2 (two) times daily. FOR TAILBONE   Yes [provider]  levothyroxine (SYNTHROID, LEVOTHROID) 137 MCG tablet Take 137 mcg by mouth daily before breakfast.    Yes [provider]  Multiple Vitamin (MULTIVITAMIN) capsule Take 1 capsule by mouth daily.   Yes [provider]  polyethylene glycol (MIRALAX / GLYCOLAX) packet Take 17 g by mouth daily as needed for mild constipation.   Yes [provider]  tamsulosin (FLOMAX) 0.4 MG CAPS capsule Take 0.4 mg by mouth daily.   Yes [provider]  triamcinolone ointment (KENALOG) 0.1 % Apply 1 application topically 2 (two) times daily.   Yes [provider]  vitamin B-12 (CYANOCOBALAMIN) 1000 MCG tablet Take 1,000 mcg by mouth daily.   Yes [provider]  zinc oxide 20 % ointment Apply 1 application topically 2 (two) times daily.   Yes [provider]    Allergies Diphenhydramine hcl and Simvastatin  Family History  Problem Relation Age of Onset  . Cancer Sister        breast  . Diabetes Brother     Social History Social History   Tobacco Use  . Smoking status: Former Research scientist (life sciences)  . Smokeless tobacco: Former Network engineer Use Topics  . Alcohol use: No  . Drug use: No    Review of Systems  Constitutional: Negative for fever. + Weakness and lightheadedness Eyes:  Negative for visual changes. ENT: Negative for sore throat. Neck: No neck pain  Cardiovascular: Negative for chest pain. Respiratory: + shortness of breath. Gastrointestinal: Negative for abdominal pain, vomiting or diarrhea. Genitourinary: Negative for dysuria. Musculoskeletal: Negative for back pain. Skin: Negative for rash. Neurological: Negative for headaches, weakness or numbness. Psych: No SI or HI  ____________________________________________   PHYSICAL EXAM:  VITAL  SIGNS: ED Triage Vitals  Enc Vitals Group     BP 01/01/18 1559 140/76     Pulse Rate 01/01/18 1559 93     Resp 01/01/18 1559 19     Temp 01/01/18 1559 98.2 F (36.8 C)     Temp Source 01/01/18 1559 Oral     SpO2 01/01/18 1559 100 %     Weight 01/01/18 1558 190 lb (86.2 kg)     Height 01/01/18 1558 6' (1.829 m)     Head Circumference --      Peak Flow --      Pain Score 01/01/18 1557 0     Pain Loc --      Pain Edu? --      Excl. in [place]? --     Constitutional: Alert and oriented. Well appearing and in no apparent distress. HEENT:      Head: Normocephalic and atraumatic.         Eyes: Conjunctivae are normal. Sclera is non-icteric.       Mouth/Throat: Mucous membranes are moist.       Neck: Supple with no signs of meningismus. Cardiovascular: Regular rate and rhythm. No murmurs, gallops, or rubs. 2+ symmetrical distal pulses are present in all extremities. No JVD. Respiratory: Normal respiratory effort. Lungs are clear to auscultation bilaterally. No wheezes, crackles, or rhonchi.  Gastrointestinal: Soft, non tender, and non distended with positive bowel sounds. No rebound or guarding. Genitourinary: No CVA tenderness. Musculoskeletal: Nontender with normal range of motion in all extremities. No edema, cyanosis, or erythema of extremities. Neurologic: Normal speech and language. Face is symmetric. Moving all extremities. No gross focal neurologic deficits are appreciated. Skin: Skin is warm, dry and intact. No rash noted. Psychiatric: Mood and affect are normal. Speech and behavior are normal.  ____________________________________________   LABS (all labs ordered are listed, but only abnormal results are displayed)  Labs Reviewed  BASIC METABOLIC PANEL - Abnormal; Notable for the following components:      Result Value   Glucose, Bld 143 (*)    GFR calc non Af Amer 47 (*)    GFR calc Af Amer 55 (*)    All other components within normal limits  URINALYSIS, COMPLETE (UACMP)  WITH MICROSCOPIC - Abnormal; Notable for the following components:   Color, Urine YELLOW (*)    APPearance HAZY (*)    Protein, ur 100 (*)    All other components within normal limits  TSH  CBC  MAGNESIUM  TROPONIN I  BLOOD GAS, ARTERIAL   ____________________________________________  EKG  ED ECG REPORT I, Rudene Re, the attending physician, personally viewed and interpreted this ECG.  Second-degree AV block Mobitz type II, rate of 84, normal QTC, incomplete LBBB, left axis deviation, no ST elevations or depressions.  Findings are new when compared to prior. ____________________________________________  RADIOLOGY  I have personally reviewed the images performed during this visit and I agree with the Radiologist's read.   Interpretation by Radiologist:  Dg Chest Portable 1 View  Result Date: 01/01/2018 CLINICAL DATA:  Chest pain. EXAM: PORTABLE CHEST 1 VIEW COMPARISON:  One-view  chest x-ray 04/18/2017 FINDINGS: Heart is mildly enlarged. Chronic interstitial coarsening is again seen. Defibrillator pad is in place. No superimposed edema or effusion is present. No focal airspace disease is present. Degenerative changes are noted in the lower cervical spine and bilateral shoulders. IMPRESSION: 1. Stable chronic interstitial coarsening of both lungs. 2. No superimposed disease.  No acute cardiopulmonary disease. Electronically Signed   By: San Morelle M.D.   On: 01/01/2018 16:37      ____________________________________________   PROCEDURES  Procedure(s) performed: None Procedures Critical Care performed:  None ____________________________________________   INITIAL IMPRESSION / ASSESSMENT AND PLAN / ED COURSE   84 y.o. male with a history of hypertension, hypothyroidism, and hyperlipidemia who presents for evaluation of generalized weakness, dizziness and shortness of breath x2 days.  Patient's initial EKG showing Mobitz type II with a rate of 84, patient is  alert and oriented x3 with a normal blood pressure, physical exam is otherwise within normal limits.  No ST elevation seen on EKG.  Pacer pads were placed on patient for precaution.  Patient being monitored on telemetry.  Patient is not on beta-blockers or calcium channel blockers.  He is not on digoxin.  Will check electrolytes, thyroid, labs to rule out anemia or cardiac ischemia.  Anticipate admission for cardiology consultation and possible pacemaker placement.  Clinical Course as of Jan 02 1852  Mon Jan 01, 2018  1715 Labs showing no evidence of electrolyte abnormalities, cardiac ischemia, anemia, or thyroid dysfunction.  Patient remains hemodynamically stable.  Will admit to the hospitalist service.   [CV]    Clinical Course User Index [CV] Alfred Levins [place], MD     As part of my medical decision making, I reviewed the following data within the [facility] notes reviewed and incorporated, Labs reviewed , EKG interpreted , Old EKG reviewed, Old chart reviewed, Radiograph reviewed , Discussed with admitting physician , A consult was requested and obtained from this/these consultant(s) Cardiology Dr. Nehemiah Massed, Notes from prior ED visits and [place] Controlled Substance Database    Pertinent labs & imaging results that were available during my care of the patient were reviewed by me and considered in my medical decision making (see chart for details).    ____________________________________________   FINAL CLINICAL IMPRESSION(S) / ED DIAGNOSES  Final diagnoses:  Second degree AV block, Mobitz type II      NEW MEDICATIONS STARTED DURING THIS VISIT:  ED Discharge Orders    None       Note:  This document was prepared using Dragon voice recognition software and may include unintentional dictation errors.    Alfred Levins, [place], MD 01/01/18 [phone]

## 2018-01-02 ENCOUNTER — Inpatient Hospital Stay (HOSPITAL_COMMUNITY)
Admit: 2018-01-02 | Discharge: 2018-01-02 | Disposition: A | Discharge status: Home / Self Care | Payer: Medicare Other | Attending: Family Medicine | Admitting: Family Medicine

## 2018-01-02 DIAGNOSIS — J9601 Acute respiratory failure with hypoxia: Principal | ICD-10-CM

## 2018-01-02 DIAGNOSIS — I5030 Unspecified diastolic (congestive) heart failure: Secondary | ICD-10-CM

## 2018-01-02 LAB — BASIC METABOLIC PANEL
Anion gap: 7 (ref 5–15)
BUN: 19 mg/dL (ref 8–23)
CALCIUM: 8.7 mg/dL — AB (ref 8.9–10.3)
CO2: 25 mmol/L (ref 22–32)
CREATININE: 1.22 mg/dL (ref 0.61–1.24)
Chloride: 104 mmol/L (ref 98–111)
GFR, EST AFRICAN AMERICAN: 55 mL/min — AB (ref >60)
GFR, EST NON AFRICAN AMERICAN: 48 mL/min — AB (ref >60)
Glucose, Bld: 107 mg/dL — ABNORMAL HIGH (ref 70–99)
Potassium: 4.2 mmol/L (ref 3.5–5.1)
Sodium: 136 mmol/L (ref 135–145)

## 2018-01-02 LAB — ECHOCARDIOGRAM COMPLETE
Height: 72 in
Weight: 2998.4 oz

## 2018-01-02 LAB — TROPONIN I: TROPONIN I: 0.03 ng/mL — AB (ref <0.03)

## 2018-01-02 MED ORDER — MOMETASONE FURO-FORMOTEROL FUM 200-5 MCG/ACT IN AERO
2.0000 | INHALATION_SPRAY | Freq: Two times a day (BID) | RESPIRATORY_TRACT | Status: DC
  Administered 2018-01-02 – 2018-01-03 (×2): 2 via RESPIRATORY_TRACT
  Filled 2018-01-02: qty 8.8

## 2018-01-02 NOTE — Consult Note (Signed)
[facility] Consultation      Assessment and Plan:  Acute hypoxic respiratory failure. Bullous emphysema Pulmonary fibrosis.  -Recommend patient be maintained on Advair 250 upon discharge. - Oxygen if needed upon discharge. - Given advanced age and relative stability of symptoms, would not recommend further work-up of underlying pulmonary fibrosis. - Continue physical activity, recommend outpatient follow-up with pulmonary in 4 to 6 weeks.  Date: 01/02/2018  MRN# [id number] Paul Cross 20-Sep-1917  Referring Physician: Dr. Aldona Lento is a 84 y.o. old male seen in consultation for chief complaint of:    Chief Complaint  Patient presents with  . Chest Pain    HPI:  The patient is a 84 year old male with history of hypertension, hypothyroidism reported on 7/15 with 2 days of weakness, lightheadedness and dyspnea.  He was found to have second-degree AV block and subsequently admitted.  He was thought to have pulmonary edema with LV systolic dysfunction.  Treated with supportive care with subsequent improvement. Currently is on nasal cannula 2 L, he is feeling significantly better than when he initially came into the hospital.  History is obtained from patient as well as his daughter at the bedside.  Patient lives at home independently, he still drives a car locally, he does his own yard work on a Engineer, building services, though this takes him much more than it used to.  He finds that he gets dyspnea on walking approximately half a block's distance.  He denies cough, hemoptysis.  He has no other frequent complaints at this time.  He has home nursing care arranged through the [place] where he gets most of his routine medical care.  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  He has no particular occupational exposures.  **CT chest 01/01/2018>> images personally reviewed, there is a  bilateral upper lobe emphysematous as well as fibrotic changes throughout both lungs, there is a large right lower lobe bleb/cavity present.  Overall findings are suggestive of severe bullous emphysema, as well as pulmonary fibrosis.  PMHX:   Past Medical History:  Diagnosis Date  . Hyperlipidemia   . Hypertension   . Hypothyroidism    approximately 2009   Surgical Hx:  Past Surgical History:  Procedure Laterality Date  . CHOLECYSTECTOMY N/A 04/16/2015   Procedure: LAPAROSCOPIC CHOLECYSTECTOMY;  Surgeon: Clayburn Pert, MD;  Location: [facility] ORS;  Service: General;  Laterality: N/A;  . FEMORAL ARTERY REPAIR     Family Hx:  Family History  Problem Relation Age of Onset  . Cancer Sister        breast  . Diabetes Brother    Social Hx:   Social History   Tobacco Use  . Smoking status: Former Research scientist (life sciences)  . Smokeless tobacco: Former Network engineer Use Topics  . Alcohol use: No  . Drug use: No   Medication:    Current Facility-Administered Medications:  .  0.9 %  sodium chloride infusion, 250 mL, Intravenous, PRN, Salary, Montell D, MD .  acetaminophen (TYLENOL) tablet 650 mg, 650 mg, Oral, Q6H PRN **OR** acetaminophen (TYLENOL) suppository 650 mg, 650 mg, Rectal, Q6H PRN, Salary, Montell D, MD .  amLODipine (NORVASC) tablet 5 mg, 5 mg, Oral, Daily, Salary, Montell D, MD, 5 mg at 01/02/18 0825 .  aspirin EC tablet 81 mg, 81 mg, Oral, Daily, Salary, Montell D, MD, 81 mg at 01/02/18 0825 .  atorvastatin (  LIPITOR) tablet 20 mg, 20 mg, Oral, QHS, Salary, Montell D, MD, 20 mg at 01/01/18 2140 .  bisacodyl (DULCOLAX) suppository 10 mg, 10 mg, Rectal, Daily PRN, Salary, Montell D, MD .  budesonide (PULMICORT) nebulizer solution 0.5 mg, 0.5 mg, Nebulization, BID, Salary, Montell D, MD, 0.5 mg at 01/02/18 0709 .  enoxaparin (LOVENOX) injection 40 mg, 40 mg, Subcutaneous, Q24H, Salary, Montell D, MD, 40 mg at 01/01/18 2141 .  furosemide (LASIX) injection 20 mg, 20 mg, Intravenous, BID,  Salary, Montell D, MD, 20 mg at 01/02/18 0826 .  HYDROcodone-acetaminophen (NORCO/VICODIN) 5-325 MG per tablet 1-2 tablet, 1-2 tablet, Oral, Q4H PRN, Salary, Montell D, MD .  ipratropium-albuterol (DUONEB) 0.5-2.5 (3) MG/3ML nebulizer solution 3 mL, 3 mL, Nebulization, Q6H PRN, Salary, Montell D, MD, 3 mL at 01/01/18 2029 .  levothyroxine (SYNTHROID, LEVOTHROID) tablet 137 mcg, 137 mcg, Oral, QAC breakfast, Salary, Montell D, MD, 137 mcg at 01/02/18 0826 .  liver oil-zinc oxide (DESITIN) 40 % ointment 1 application, 1 application, Topical, BID, Salary, Montell D, MD .  morphine 2 MG/ML injection 2 mg, 2 mg, Intravenous, Q2H PRN, Salary, Montell D, MD .  multivitamin with minerals tablet 1 tablet, 1 tablet, Oral, Daily, Salary, Montell D, MD, 1 tablet at 01/02/18 0824 .  nitroGLYCERIN (NITROSTAT) SL tablet 0.4 mg, 0.4 mg, Sublingual, Q5 min PRN, Salary, Montell D, MD .  ondansetron (ZOFRAN) tablet 4 mg, 4 mg, Oral, Q6H PRN **OR** ondansetron (ZOFRAN) injection 4 mg, 4 mg, Intravenous, Q6H PRN, Salary, Montell D, MD .  polyethylene glycol (MIRALAX / GLYCOLAX) packet 17 g, 17 g, Oral, Daily PRN, Salary, Montell D, MD .  sodium chloride flush (NS) 0.9 % injection 3 mL, 3 mL, Intravenous, Q12H, Salary, Montell D, MD, 3 mL at 01/02/18 0828 .  sodium chloride flush (NS) 0.9 % injection 3 mL, 3 mL, Intravenous, Q12H, Salary, Montell D, MD, 3 mL at 01/02/18 0828 .  sodium chloride flush (NS) 0.9 % injection 3 mL, 3 mL, Intravenous, PRN, Salary, Montell D, MD .  sodium phosphate (FLEET) 7-19 GM/118ML enema 1 enema, 1 enema, Rectal, Once PRN, Salary, Montell D, MD .  tamsulosin (FLOMAX) capsule 0.4 mg, 0.4 mg, Oral, Daily, Salary, Montell D, MD, 0.4 mg at 01/02/18 0825 .  traZODone (DESYREL) tablet 50 mg, 50 mg, Oral, QHS PRN, Salary, Montell D, MD, 50 mg at 01/01/18 2141 .  triamcinolone ointment (KENALOG) 0.1 % 1 application, 1 application, Topical, BID, Salary, Montell D, MD .  vitamin B-12 (CYANOCOBALAMIN)  tablet 1,000 mcg, 1,000 mcg, Oral, Daily, Salary, Montell D, MD, 1,000 mcg at 01/02/18 4098   Allergies:  Diphenhydramine hcl and Simvastatin  Review of Systems: Gen:  Denies  fever, sweats, chills HEENT: Denies blurred vision, double vision. bleeds, sore throat Cvc:  No dizziness, chest pain. Resp:   Denies cough or sputum production, shortness of breath Gi: Denies swallowing difficulty, stomach pain. Gu:  Denies bladder incontinence, burning urine Ext:   No Joint pain, stiffness. Skin: No skin rash,  hives  Endoc:  No polyuria, polydipsia. Psych: No depression, insomnia. Other:  All other systems were reviewed with the patient and were negative other that what is mentioned in the HPI.   Physical Examination:   VS: BP 125/64 (BP Location: Left Arm)   Pulse 73   Temp 98.6 F (37 C) (Oral)   Resp 16   Ht 6' (1.829 m)   Wt 187 lb 6.4 oz (85 kg)   SpO2 93%  BMI 25.42 kg/m   General Appearance: No distress  Neuro:without focal findings,  speech normal,  HEENT: PERRLA, EOM intact.   Pulmonary: normal breath sounds, No wheezing.  CardiovascularNormal S1,S2.  No m/r/g.   Abdomen: Benign, Soft, non-tender. Renal:  No costovertebral tenderness  GU:  No performed at this time. Endoc: No evident thyromegaly, no signs of acromegaly. Skin:   warm, no rashes, no ecchymosis  Extremities: normal, no cyanosis, clubbing.  Other findings:    LABORATORY PANEL:   CBC Recent Labs  Lab 01/01/18 1607  WBC 6.8  HGB 14.6  HCT 42.9  PLT 181   ------------------------------------------------------------------------------------------------------------------  Chemistries  Recent Labs  Lab 01/01/18 1607 01/02/18 0757  NA 139 136  K 4.5 4.2  CL 103 104  CO2 27 25  GLUCOSE 143* 107*  BUN 21 19  CREATININE 1.23 1.22  CALCIUM 9.5 8.7*  MG 2.1  --    ------------------------------------------------------------------------------------------------------------------  Cardiac  Enzymes Recent Labs  Lab 01/02/18 0757  TROPONINI <0.03   ------------------------------------------------------------  RADIOLOGY:  Ct Chest Wo Contrast  Result Date: 01/01/2018 CLINICAL DATA:  Weakness and fatigue. Shortness of breath. Dyspnea. Leg swelling. EXAM: CT CHEST WITHOUT CONTRAST TECHNIQUE: Multidetector CT imaging of the chest was performed following the standard protocol without IV contrast. COMPARISON:  01/01/2018 FINDINGS: Cardiovascular: Coronary, aortic arch, and branch vessel atherosclerotic vascular disease. Aortic and mitral valve calcifications. Mild cardiomegaly. Heterogeneous density in vascular structures, today's exam does not exclude pulmonary embolus. Layered calcification along the aortic arch possibly from chronic intramural thrombus, chronic penetrating ulcer, or a small chronic dissection. No hyperdensity in the aortic wall to suggest an acute intramural hematoma. Mediastinum/Nodes: Right hilar node 2.0 cm in short axis on image 66/2. Subcarinal node 1.6 cm in short axis, image 73/2. AP window lymph node 1.1 cm in short axis, image 54/2. Calcified right infrahilar lymph nodes. Lungs/Pleura: Biapical pleuroparenchymal scarring. Coarse peripheral interstitial accentuation with honeycombing. Large confluent bulla posteriorly along the right lower lobe with thickened septations. Airspace opacity in the apical segment left upper lobe. Peripheral interstitial accentuation suggesting fibrosis and possibly usual interstitial pneumonia. Densely calcified granuloma in the right middle lobe. Centrilobular and paraseptal emphysema. Upper Abdomen: Upper abdominal aortic atherosclerotic calcification. Possible left kidney upper pole nephrolithiasis. Old granulomatous disease of the spleen. 1.3 by 1.8 cm right adrenal mass with internal density 15 Hounsfield units, nonspecific. Musculoskeletal: Unremarkable IMPRESSION: 1. 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 2. Airspace opacity in the apical segment left upper lobe, potentially from superimposed pneumonia. 3. There is also biapical pleuroparenchymal scarring and old granulomatous disease. 4. Aortic Atherosclerosis (ICD10-I70.0). Coronary atherosclerosis with aortic and mitral valve calcification as well as mild cardiomegaly. 5. Heterogeneous density in the vascular structures, cannot exclude pulmonary embolus. Today's exam was performed without IV contrast. 6. Layered calcification along the aortic arch potentially from chronic penetrating ulcer or a small chronic dissection. 7. Centrilobular and paraseptal emphysema. 8. There is a posterior large bulla in the right lower lobe with thickened margins, possibly from mild superinfection, rarely lung cancer can appear this way. 9. Possible left kidney upper pole nephrolithiasis. 10. Nonspecific 1.8 cm right adrenal mass. 11.  Emphysema (ICD10-J43.9). Electronically Signed   By: Van Clines M.D.   On: 01/01/2018 22:05   Dg Chest Portable 1 View  Result Date: 01/01/2018 CLINICAL DATA:  Chest pain. EXAM: PORTABLE CHEST 1 VIEW COMPARISON:  One-view chest x-ray 04/18/2017 FINDINGS: Heart is mildly enlarged. Chronic interstitial coarsening is  again seen. Defibrillator pad is in place. No superimposed edema or effusion is present. No focal airspace disease is present. Degenerative changes are noted in the lower cervical spine and bilateral shoulders. IMPRESSION: 1. Stable chronic interstitial coarsening of both lungs. 2. No superimposed disease.  No acute cardiopulmonary disease. Electronically Signed   By: San Morelle M.D.   On: 01/01/2018 16:37       Thank  you for the consultation and for allowing [facility] to assist in the care of your patient. Our recommendations are noted above.  Please contact us if we can be of further service.   Marda Stalker, M.D., F.C.C.P.  Board  Certified in Internal Medicine, Pulmonary Medicine, [place], and Sleep Medicine.  [facility] Office Number: [phone]   01/02/2018

## 2018-01-02 NOTE — Progress Notes (Signed)
[facility] NAME: Paul Cross    MR#:  [id number]  DATE OF BIRTH:  04-12-1918  SUBJECTIVE:   Pt. Here due to weakness, shortness of breath and shaking.  Feels much better today.  Shortness of breath improved.    REVIEW OF SYSTEMS:    Review of Systems  Constitutional: Negative for chills and fever.  HENT: Negative for congestion and tinnitus.   Eyes: Negative for blurred vision and double vision.  Respiratory: Negative for cough, shortness of breath and wheezing.   Cardiovascular: Negative for chest pain, orthopnea and PND.  Gastrointestinal: Negative for abdominal pain, diarrhea, nausea and vomiting.  Genitourinary: Negative for dysuria and hematuria.  Neurological: Negative for dizziness, sensory change and focal weakness.  All other systems reviewed and are negative.   Nutrition: 2 gm sodium.  Tolerating Diet: Yes Tolerating PT: Await eval.   DRUG ALLERGIES:   Allergies  Allergen Reactions  . Diphenhydramine Hcl Other (See Comments)    Reaction:  Unknown   . Simvastatin Other (See Comments)    Reaction:  Unknown     VITALS:  Blood pressure 125/64, pulse 73, temperature 98.6 F (37 C), temperature source Oral, resp. rate 16, height 6' (1.829 m), weight 85 kg (187 lb 6.4 oz), SpO2 93 %.  PHYSICAL EXAMINATION:   Physical Exam  GENERAL:  84 y.o.-year-old patient sitting up in bed in no acute distress.  EYES: Pupils equal, round, reactive to light and accommodation. No scleral icterus. Extraocular muscles intact.  HEENT: Head atraumatic, normocephalic. Oropharynx and nasopharynx clear.  NECK:  Supple, no jugular venous distention. No thyroid enlargement, no tenderness.  LUNGS: Normal breath sounds bilaterally, no wheezing, rales, rhonchi. No use of accessory muscles of respiration.  CARDIOVASCULAR: S1, S2 normal. No murmurs, rubs, or gallops.  ABDOMEN: Soft, nontender, nondistended. Bowel sounds present. No  organomegaly or mass.  EXTREMITIES: No cyanosis, clubbing or edema b/l.    NEUROLOGIC: Cranial nerves II through XII are intact. No focal Motor or sensory deficits b/l.   PSYCHIATRIC: The patient is alert and oriented x 3.  SKIN: No obvious rash, lesion, or ulcer.    LABORATORY PANEL:   CBC Recent Labs  Lab 01/01/18 1607  WBC 6.8  HGB 14.6  HCT 42.9  PLT 181   ------------------------------------------------------------------------------------------------------------------  Chemistries  Recent Labs  Lab 01/01/18 1607 01/02/18 0757  NA 139 136  K 4.5 4.2  CL 103 104  CO2 27 25  GLUCOSE 143* 107*  BUN 21 19  CREATININE 1.23 1.22  CALCIUM 9.5 8.7*  MG 2.1  --    ------------------------------------------------------------------------------------------------------------------  Cardiac Enzymes Recent Labs  Lab 01/02/18 0757  TROPONINI <0.03   ------------------------------------------------------------------------------------------------------------------  RADIOLOGY:  Ct Chest Wo Contrast  Result Date: 01/01/2018 CLINICAL DATA:  Weakness and fatigue. Shortness of breath. Dyspnea. Leg swelling. EXAM: CT CHEST WITHOUT CONTRAST TECHNIQUE: Multidetector CT imaging of the chest was performed following the standard protocol without IV contrast. COMPARISON:  01/01/2018 FINDINGS: Cardiovascular: Coronary, aortic arch, and branch vessel atherosclerotic vascular disease. Aortic and mitral valve calcifications. Mild cardiomegaly. Heterogeneous density in vascular structures, today's exam does not exclude pulmonary embolus. Layered calcification along the aortic arch possibly from chronic intramural thrombus, chronic penetrating ulcer, or a small chronic dissection. No hyperdensity in the aortic wall to suggest an acute intramural hematoma. Mediastinum/Nodes: Right hilar node 2.0 cm in short axis on image 66/2. Subcarinal node 1.6 cm in short axis, image 73/2. AP window lymph node  1.1  cm in short axis, image 54/2. Calcified right infrahilar lymph nodes. Lungs/Pleura: Biapical pleuroparenchymal scarring. Coarse peripheral interstitial accentuation with honeycombing. Large confluent bulla posteriorly along the right lower lobe with thickened septations. Airspace opacity in the apical segment left upper lobe. Peripheral interstitial accentuation suggesting fibrosis and possibly usual interstitial pneumonia. Densely calcified granuloma in the right middle lobe. Centrilobular and paraseptal emphysema. Upper Abdomen: Upper abdominal aortic atherosclerotic calcification. Possible left kidney upper pole nephrolithiasis. Old granulomatous disease of the spleen. 1.3 by 1.8 cm right adrenal mass with internal density 15 Hounsfield units, nonspecific. Musculoskeletal: Unremarkable IMPRESSION: 1. 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 2. Airspace opacity in the apical segment left upper lobe, potentially from superimposed pneumonia. 3. There is also biapical pleuroparenchymal scarring and old granulomatous disease. 4. Aortic Atherosclerosis (ICD10-I70.0). Coronary atherosclerosis with aortic and mitral valve calcification as well as mild cardiomegaly. 5. Heterogeneous density in the vascular structures, cannot exclude pulmonary embolus. Today's exam was performed without IV contrast. 6. Layered calcification along the aortic arch potentially from chronic penetrating ulcer or a small chronic dissection. 7. Centrilobular and paraseptal emphysema. 8. There is a posterior large bulla in the right lower lobe with thickened margins, possibly from mild superinfection, rarely lung cancer can appear this way. 9. Possible left kidney upper pole nephrolithiasis. 10. Nonspecific 1.8 cm right adrenal mass. 11.  Emphysema (ICD10-J43.9). Electronically Signed   By: Van Clines M.D.   On: 01/01/2018 22:05   Dg Chest Portable 1 View  Result Date:  01/01/2018 CLINICAL DATA:  Chest pain. EXAM: PORTABLE CHEST 1 VIEW COMPARISON:  One-view chest x-ray 04/18/2017 FINDINGS: Heart is mildly enlarged. Chronic interstitial coarsening is again seen. Defibrillator pad is in place. No superimposed edema or effusion is present. No focal airspace disease is present. Degenerative changes are noted in the lower cervical spine and bilateral shoulders. IMPRESSION: 1. Stable chronic interstitial coarsening of both lungs. 2. No superimposed disease.  No acute cardiopulmonary disease. Electronically Signed   By: San Morelle M.D.   On: 01/01/2018 16:37     ASSESSMENT AND PLAN:   84 year old male with past medical history of pretension, hyperlipidemia, hypothyroidism who presents to the hospital due to shortness of breath, weakness and noted to be hypoxic and also noted to have a type I AV nodal block on telemetry.  1.  Acute respiratory failure with hypoxia- this is secondary to patient's underlying COPD/pulmonary fibrosis/interstitial lung disease. - Patient has a long history of tobacco abuse but quit many years ago.  His CT scan of the chest is consistent with emphysema, pulmonary fibrosis and also bullous lung disease.  This is likely the source of patient's hypoxemia and unlikely cardiac in nature. - Await pulmonary input, patient will need to be assessed for home oxygen prior to discharge.  2.  Acute Mobitz type I AV block- noted on telemetry and on admission. -Patient is clinically asymptomatic.  Await echocardiogram results, seen by cardiology and no plans for further intervention.    3. COPD/Emphysema/Interstitial Lung disease - await Pulm. Consult. No acute exacerbation.  - may need Home O2 upon discharge.   4. Hypothyroidism - cont. Synthroid.   5.  HTN - cont. Norvasc  6. BPH - cont. Flomax.   7. Hyperlipidemia - cont. Atorvastatin  All the records are reviewed and case discussed with Care Management/Social Worker. Management plans  discussed with the patient, family and they are in agreement.  CODE STATUS: Full code  DVT Prophylaxis:  Lovenox  TOTAL TIME TAKING CARE OF THIS PATIENT: 35 minutes.   POSSIBLE D/C IN 1-2 DAYS, DEPENDING ON CLINICAL CONDITION.   Henreitta Leber M.D on 01/02/2018 at 1:39 PM  Between 7am to 6pm - Pager - [phone]  After 6pm go to [email or web address] - [facility]  Office  [phone]  CC: Primary care physician; [facility]

## 2018-01-02 NOTE — Plan of Care (Signed)
  Problem: Education: Goal: Knowledge of General Education information will improve Outcome: Progressing   Problem: Health Behavior/Discharge Planning: Goal: Ability to manage health-related needs will improve Outcome: Progressing   Problem: Safety: Goal: Ability to remain free from injury will improve Outcome: Progressing   Problem: Skin Integrity: Goal: Risk for impaired skin integrity will decrease Outcome: Progressing

## 2018-01-02 NOTE — Consult Note (Signed)
[facility] Consultation Note  [name], MRN: [id number], DOB/AGE: [birth year] 84 y.o. Admit date: 01/01/2018   Date of Consult: 01/02/2018 Primary Physician: [facility] Primary Cardiologist: None  Chief Complaint:  Chief Complaint  Patient presents with  . Chest Pain   Reason for Consult: Weakness  HPI: 84 y.o. male with essential hypertension mixed hyperlipidemia who has been doing fairly well in recent years with no evidence of history of cardiovascular disease who has increase in shortness of breath with physical activity relieved by rest over the last 6 months but significant culminating in severe fatigue and weakness and shakiness over the last 2 days.  He did come into the hospital with significant concerns and has had a chest x-ray with possible pulmonary edema as well as some lower extremity edema and an abnormal EKG.  EKG shows normal sinus rhythm with second-degree type I AV block and left bundle branch block.  Troponin levels are normal without evidence of myocardial infarction.  Currently he is improved with furosemide and other medication management and feels much better today.  Is been no evidence of chest discomfort  Past Medical History:  Diagnosis Date  . Hyperlipidemia   . Hypertension   . Hypothyroidism    approximately 2009      Surgical History:  Past Surgical History:  Procedure Laterality Date  . CHOLECYSTECTOMY N/A 04/16/2015   Procedure: LAPAROSCOPIC CHOLECYSTECTOMY;  Surgeon: Clayburn Pert, MD;  Location: [facility] ORS;  Service: General;  Laterality: N/A;  . FEMORAL ARTERY REPAIR       Home Meds: Prior to Admission medications   Medication Sig Start Date End Date Taking? Authorizing Provider  amLODipine (NORVASC) 5 MG tablet Take 5 mg by mouth daily.   Yes [provider]  aspirin EC 81 MG tablet Take 81 mg by mouth daily.   Yes [provider]  atorvastatin (LIPITOR) 40 MG tablet Take  20 mg by mouth at bedtime.   Yes [provider]  hydrocortisone cream 1 % Apply 1 application topically 2 (two) times daily. FOR TAILBONE   Yes [provider]  levothyroxine (SYNTHROID, LEVOTHROID) 137 MCG tablet Take 137 mcg by mouth daily before breakfast.    Yes [provider]  Multiple Vitamin (MULTIVITAMIN) capsule Take 1 capsule by mouth daily.   Yes [provider]  polyethylene glycol (MIRALAX / GLYCOLAX) packet Take 17 g by mouth daily as needed for mild constipation.   Yes [provider]  tamsulosin (FLOMAX) 0.4 MG CAPS capsule Take 0.4 mg by mouth daily.   Yes [provider]  triamcinolone ointment (KENALOG) 0.1 % Apply 1 application topically 2 (two) times daily.   Yes [provider]  vitamin B-12 (CYANOCOBALAMIN) 1000 MCG tablet Take 1,000 mcg by mouth daily.   Yes [provider]  zinc oxide 20 % ointment Apply 1 application topically 2 (two) times daily.   Yes [provider]    Inpatient Medications:  . amLODipine  5 mg Oral Daily  . aspirin EC  81 mg Oral Daily  . atorvastatin  20 mg Oral QHS  . budesonide (PULMICORT) nebulizer solution  0.5 mg Nebulization BID  . enoxaparin (LOVENOX) injection  40 mg Subcutaneous Q24H  . furosemide  20 mg Intravenous BID  . levothyroxine  137 mcg Oral QAC breakfast  . liver oil-zinc oxide  1 application Topical BID  . multivitamin with minerals  1 tablet Oral Daily  . sodium chloride flush  3 mL Intravenous Q12H  . sodium chloride flush  3 mL Intravenous Q12H  . tamsulosin  0.4 mg Oral Daily  . triamcinolone ointment  1 application Topical BID  . vitamin B-12  1,000 mcg Oral Daily   . sodium chloride      Allergies:  Allergies  Allergen Reactions  . Diphenhydramine Hcl Other (See Comments)    Reaction:  Unknown   . Simvastatin Other (See Comments)    Reaction:  Unknown     Social History   Socioeconomic History  . Marital status: Widowed     Spouse name: Not on file  . Number of children: Not on file  . Years of education: Not on file  . Highest education level: Not on file  Occupational History  . Not on file  Social Needs  . Financial resource strain: Not on file  . Food insecurity:    Worry: Not on file    Inability: Not on file  . Transportation needs:    Medical: Not on file    Non-medical: Not on file  Tobacco Use  . Smoking status: Former Research scientist (life sciences)  . Smokeless tobacco: Former Network engineer and Sexual Activity  . Alcohol use: No  . Drug use: No  . Sexual activity: Not on file  Lifestyle  . Physical activity:    Days per week: Not on file    Minutes per session: Not on file  . Stress: Not on file  Relationships  . Social connections:    Talks on phone: Not on file    Gets together: Not on file    Attends religious service: Not on file    Active member of club or organization: Not on file    Attends meetings of clubs or organizations: Not on file    Relationship status: Not on file  . Intimate partner violence:    Fear of current or ex partner: Not on file    Emotionally abused: Not on file    Physically abused: Not on file    Forced sexual activity: Not on file  Other Topics Concern  . Not on file  Social History Narrative  . Not on file     Family History  Problem Relation Age of Onset  . Cancer Sister        breast  . Diabetes Brother      Review of Systems Positive for shortness of breath weakness fatigue Negative for: General:  chills, fever, night sweats or weight changes.  Cardiovascular: PND orthopnea syncope dizziness  Dermatological skin lesions rashes Respiratory: Cough congestion Urologic: Frequent urination urination at night and hematuria Abdominal: negative for nausea, vomiting, diarrhea, bright red blood per rectum, melena, or hematemesis Neurologic: negative for visual changes, and/or hearing changes  All other systems reviewed and are otherwise negative except as  noted above.  Labs: Recent Labs    01/01/18 1607 01/01/18 2233 01/02/18 0154  TROPONINI <0.03 0.03* 0.03*   Lab Results  Component Value Date   WBC 6.8 01/01/2018   HGB 14.6 01/01/2018   HCT 42.9 01/01/2018   MCV 97.3 01/01/2018   PLT 181 01/01/2018    Recent Labs  Lab 01/01/18 1607  NA 139  K 4.5  CL 103  CO2 27  BUN 21  CREATININE 1.23  CALCIUM 9.5  GLUCOSE 143*   No results found for: CHOL, HDL, LDLCALC, TRIG No results found for: DDIMER  Radiology/Studies:  Ct Chest Wo Contrast  Result Date: 01/01/2018 CLINICAL DATA:  Weakness and fatigue. Shortness of breath. Dyspnea. Leg swelling. EXAM: CT CHEST WITHOUT CONTRAST TECHNIQUE: Multidetector CT imaging of the chest was performed following the standard protocol without IV contrast. COMPARISON:  01/01/2018 FINDINGS: Cardiovascular: Coronary, aortic arch, and branch vessel atherosclerotic vascular disease. Aortic and mitral valve calcifications. Mild cardiomegaly. Heterogeneous density in vascular structures, today's exam does not exclude pulmonary embolus. Layered calcification along the aortic arch possibly from chronic intramural thrombus, chronic penetrating ulcer, or a small chronic dissection. No hyperdensity in the aortic wall to suggest an acute intramural hematoma. Mediastinum/Nodes: Right hilar node 2.0 cm in short axis on image 66/2. Subcarinal node 1.6 cm in short axis, image 73/2. AP window lymph node 1.1 cm in short axis, image 54/2. Calcified right infrahilar lymph nodes. Lungs/Pleura: Biapical pleuroparenchymal scarring. Coarse peripheral interstitial accentuation with honeycombing. Large confluent bulla posteriorly along the right lower lobe with thickened septations. Airspace opacity in the apical segment left upper lobe. Peripheral interstitial accentuation suggesting fibrosis and possibly usual interstitial pneumonia. Densely calcified granuloma in the right middle lobe. Centrilobular and paraseptal emphysema.  Upper Abdomen: Upper abdominal aortic atherosclerotic calcification. Possible left kidney upper pole nephrolithiasis. Old granulomatous disease of the spleen. 1.3 by 1.8 cm right adrenal mass with internal density 15 Hounsfield units, nonspecific. Musculoskeletal: Unremarkable IMPRESSION: 1. 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 2. Airspace opacity in the apical segment left upper lobe, potentially from superimposed pneumonia. 3. There is also biapical pleuroparenchymal scarring and old granulomatous disease. 4. Aortic Atherosclerosis (ICD10-I70.0). Coronary atherosclerosis with aortic and mitral valve calcification as well as mild cardiomegaly. 5. Heterogeneous density in the vascular structures, cannot exclude pulmonary embolus. Today's exam was performed without IV contrast. 6. Layered calcification along the aortic arch potentially from chronic penetrating ulcer or a small chronic dissection. 7. Centrilobular and paraseptal emphysema. 8. There is a posterior large bulla in the right lower lobe with thickened margins, possibly from mild superinfection, rarely lung cancer can appear this way. 9. Possible left kidney upper pole nephrolithiasis. 10. Nonspecific 1.8 cm right adrenal mass. 11.  Emphysema (ICD10-J43.9). Electronically Signed   By: Van Clines M.D.   On: 01/01/2018 22:05   Dg Chest Portable 1 View  Result Date: 01/01/2018 CLINICAL DATA:  Chest pain. EXAM: PORTABLE CHEST 1 VIEW COMPARISON:  One-view chest x-ray 04/18/2017 FINDINGS: Heart is mildly enlarged. Chronic interstitial coarsening is again seen. Defibrillator pad is in place. No superimposed edema or effusion is present. No focal airspace disease is present. Degenerative changes are noted in the lower cervical spine and bilateral shoulders. IMPRESSION: 1. Stable chronic interstitial coarsening of both lungs. 2. No superimposed disease.  No acute cardiopulmonary disease.  Electronically Signed   By: San Morelle M.D.   On: 01/01/2018 16:37    EKG: Normal sinus rhythm with second-degree type I AV block with left bundle branch block  Weights: Filed Weights   01/01/18 1558 01/01/18 2001 01/02/18 0312  Weight: 190 lb (86.2 kg) 185 lb 12.8 oz (84.3 kg) 187 lb 6.4 oz (85 kg)     Physical Exam: Blood pressure 125/64, pulse 73, temperature 98.6 F (37 C), temperature source Oral, resp. rate 16, height 6' (1.829 m), weight 187 lb 6.4 oz (85 kg), SpO2 93 %. Body mass index is 25.42 kg/m. General: Well developed, well nourished, in no acute distress. Head eyes ears nose throat: Normocephalic, atraumatic, sclera non-icteric, no xanthomas, nares are without discharge. No apparent thyromegaly and/or mass  Lungs: Normal respiratory effort.  Few's wheezes, no rales, some rhonchi.  Heart: Irregular with normal S1 S2. no murmur gallop, no rub, PMI is normal size and placement, carotid upstroke normal without bruit, jugular venous pressure is normal Abdomen: Soft, non-tender, non-distended with normoactive bowel sounds. No hepatomegaly. No rebound/guarding. No obvious abdominal masses. Abdominal aorta is normal size without bruit Extremities: 1+ edema. no cyanosis, no clubbing, no ulcers  Peripheral : 2+ bilateral upper extremity pulses, 2+ bilateral femoral pulses, 2+ bilateral dorsal pedal pulse Neuro: Alert and oriented. No facial asymmetry. No focal deficit. Moves all extremities spontaneously. Musculoskeletal: Normal muscle tone without kyphosis Psych:  Responds to questions appropriately with a normal affect.    Assessment: Acute congestive heart failure with abnormal EKG and second-degree type I AV block and no current evidence of myocardial infarction is slowly improving  Plan: 1.  Continue furosemide for pulmonary edema LV systolic dysfunction 2.  Continue watching for advanced heart block but no further intervention of second-degree type I AV block 3.   Echocardiogram for LV systolic dysfunction valvular heart disease contributing to above 4.  Continue antihypertensive medication management at this time for current conditions 5.  Continuation of supportive care for pulmonary and/or respiratory abnormalities 6.  Further treatment options after above  Signed, Corey Skains M.D. [facility] 01/02/2018, 8:52 AM

## 2018-01-02 NOTE — Evaluation (Signed)
Physical Therapy Evaluation Patient Details Name: Paul Cross MRN: [id number] DOB: 1918-02-19 Today's Date: 01/02/2018   History of Present Illness  84 year old male with past medical history of pretension, hyperlipidemia, hypothyroidism who presents to the hospital due to shortness of breath, weakness.    Clinical Impression  Pt very pleasant t/o the session and eager to do all he can.  He reports he is feeling much better today and was eager see what he could do.  Pt's O2 was 97% on 2 liters at rest on arrival, stayed in the high 90s on room air at rest and he was able to maintain 90s during first 100-125 ft of ambulation, he did not report significant fatigue but ultimately his sats dropped to low 80s by the end of the bout of ambulation.  Education/gait training for appropriate breathing, AD use, cadence/safety awareness apart from the intial exam portion.    He showed minor safety concerns with stagger stepping using cane and PT suggested he use RW initially at home and pt will benefit from HHPT upon discharge.      Follow Up Recommendations Home health PT    Equipment Recommendations  None recommended by PT    Recommendations for Other Services       Precautions / Restrictions Precautions Precautions: Fall Restrictions Weight Bearing Restrictions: No      Mobility  Bed Mobility               General bed mobility comments: Pt up in recliner on arrival, appears he could have done bed mobility w/o issue  Transfers Overall transfer level: Independent Equipment used: Straight cane             General transfer comment: Pt able to rise from standing w/o assist and showed good confidence and balance   Ambulation/Gait Ambulation/Gait assistance: Supervision Gait Distance (Feet): 200 Feet Assistive device: Straight cane       General Gait Details: Pt is able to ambulate relatively well with SPC and no O2.  He had a few small stagger steps that he was able  to self arrest ("I've been sitting too long, I'm just stiff.")  His O2 on room air was mid/high 90s, is slowly dropped but he was able to maintain O2 in the 90s for the first ~125 ft, then he dipped into the 80s and by the time we got back to the room he was as low as 81% and needed ~5 minutes on 2 liters to regain sats >92%  Stairs            Wheelchair Mobility    Modified Rankin (Stroke Patients Only)       Balance Overall balance assessment: Modified Independent(no h/o falls, does have a few small LOBs)                                           Pertinent Vitals/Pain Pain Assessment: No/denies pain    Home Living Family/patient expects to be discharged to:: Private residence Living Arrangements: Alone Available Help at Discharge: Family(daughter checks in weekly and does grocery shopping)   Home Access: Stairs to enter Entrance Stairs-Rails: Right Entrance Stairs-Number of Steps: [place]: [place] - 2 wheels;Cane - single point      Prior Function Level of Independence: Independent with assistive device(s)         Comments: Pt walks down  long driveway daily for mail, mows lawn, goes out with family at times.  Does have help with meals, heavier house work and medicine organization     Journalist, newspaper        Extremity/Trunk Assessment   Upper Extremity Assessment Upper Extremity Assessment: Overall WFL for tasks assessed(age appropriate deficits)    Lower Extremity Assessment Lower Extremity Assessment: Overall WFL for tasks assessed(age appropriate deficits)       Communication   Communication: No difficulties  Cognition Arousal/Alertness: Awake/alert Behavior During Therapy: WFL for tasks assessed/performed Overall Cognitive Status: Within Functional Limits for tasks assessed                                        General Comments      Exercises     Assessment/Plan    PT Assessment Patient needs  continued PT services  PT Problem List Decreased strength;Decreased range of motion;Decreased activity tolerance;Decreased balance;Decreased mobility;Decreased safety awareness;Decreased knowledge of use of DME;Cardiopulmonary status limiting activity       PT Treatment Interventions DME instruction;Gait training;Stair training;Functional mobility training;Therapeutic activities;Therapeutic exercise;Balance training;Neuromuscular re-education;Patient/family education    PT Goals (Current goals can be found in the Care Plan section)  Acute Rehab PT Goals Patient Stated Goal: go home PT Goal Formulation: With patient/family Time For Goal Achievement: 01/16/18 Potential to Achieve Goals: Good    Frequency Min 2X/week   Barriers to discharge        Co-evaluation               AM-PAC PT "6 Clicks" Daily Activity  Outcome Measure Difficulty turning over in bed (including adjusting bedclothes, sheets and blankets)?: None Difficulty moving from lying on back to sitting on the side of the bed? : None Difficulty sitting down on and standing up from a chair with arms (e.g., wheelchair, bedside commode, etc,.)?: None Help needed moving to and from a bed to chair (including a wheelchair)?: None Help needed walking in hospital room?: None Help needed climbing 3-5 steps with a railing? : None 6 Click Score: 24    End of Session Equipment Utilized During Treatment: Gait belt;Oxygen(2 liters pre and post ambulation) Activity Tolerance: Patient limited by fatigue;Patient tolerated treatment well Patient left: with chair alarm set;with call bell/phone within reach;with family/visitor present Nurse Communication: Mobility status(O2 drop to low 80s with prolonged ambulation on room air) PT Visit Diagnosis: Muscle weakness (generalized) (M62.81);Difficulty in walking, not elsewhere classified (R26.2)    Time: 1430-1455 PT Time Calculation (min) (ACUTE ONLY): 25 min   Charges:   PT  Evaluation $PT Eval Low Complexity: 1 Low PT Treatments $Gait Training: 8-22 mins   PT G Codes:        Kreg Shropshire, DPT 01/02/2018, 4:41 PM

## 2018-01-02 NOTE — Progress Notes (Signed)
*  PRELIMINARY RESULTS* Echocardiogram 2D Echocardiogram has been performed.  Paul Cross 01/02/2018, 1:20 PM

## 2018-01-03 MED ORDER — FLUTICASONE-SALMETEROL 250-50 MCG/DOSE IN AEPB: 1.0000 | INHALATION_SPRAY | Freq: Two times a day (BID) | RESPIRATORY_TRACT | 0 refills | Status: DC

## 2018-01-03 NOTE — Progress Notes (Signed)
Called patient's daughter on her home phone number regarding patient leaving one of his hearing aides in his previous room (#[id number]) in a pink denture cup. Left message. Found when room was being stripped / cleaned. Wenda Low [facility]

## 2018-01-03 NOTE — Progress Notes (Signed)
[facility] Encounter Note  Patient: Paul Cross / Admit Date: 01/01/2018 / Date of Encounter: 01/03/2018, 9:10 AM   Subjective: Patient feeling much better today.  No evidence of chest pain weakness or congestive heart failure symptoms.  Telemetry shows normal sinus rhythm with second-degree type I AV block with no evidence of advanced heart block or symptoms of syncope dizziness Echocardiogram showing normal LV systolic function with ejection fraction of 60%  Review of Systems: Positive for: Weakness Negative for: Vision change, hearing change, syncope, dizziness, nausea, vomiting,diarrhea, bloody stool, stomach pain, cough, congestion, diaphoresis, urinary frequency, urinary pain,skin lesions, skin rashes Others previously listed  Objective: Telemetry: This rhythm Physical Exam: Blood pressure 131/62, pulse 73, temperature 97.6 F (36.4 C), temperature source Oral, resp. rate 18, height 6' (1.829 m), weight 185 lb 3.2 oz (84 kg), SpO2 95 %. Body mass index is 25.12 kg/m. General: Well developed, well nourished, in no acute distress. Head: Normocephalic, atraumatic, sclera non-icteric, no xanthomas, nares are without discharge. Neck: No apparent masses Lungs: Normal respirations with no wheezes, no rhonchi, no rales , few basilar crackles   Heart: Regular rate and rhythm, normal S1 S2, no murmur, no rub, no gallop, PMI is normal size and placement, carotid upstroke normal without bruit, jugular venous pressure normal Abdomen: Soft, non-tender, non-distended with normoactive bowel sounds. No hepatosplenomegaly. Abdominal aorta is normal size without bruit Extremities: Trace edema, no clubbing, no cyanosis, no ulcers,  Peripheral: 2+ radial, 2+ femoral, 2+ dorsal pedal pulses Neuro: Alert and oriented. Moves all extremities spontaneously. Psych:  Responds to questions appropriately with a normal affect.   Intake/Output Summary (Last 24 hours) at 01/03/2018  0910 Last data filed at 01/03/2018 0300 Gross per 24 hour  Intake 480 ml  Output 300 ml  Net 180 ml    Inpatient Medications:  . amLODipine  5 mg Oral Daily  . aspirin EC  81 mg Oral Daily  . atorvastatin  20 mg Oral QHS  . enoxaparin (LOVENOX) injection  40 mg Subcutaneous Q24H  . levothyroxine  137 mcg Oral QAC breakfast  . liver oil-zinc oxide  1 application Topical BID  . mometasone-formoterol  2 puff Inhalation BID  . multivitamin with minerals  1 tablet Oral Daily  . sodium chloride flush  3 mL Intravenous Q12H  . sodium chloride flush  3 mL Intravenous Q12H  . tamsulosin  0.4 mg Oral Daily  . triamcinolone ointment  1 application Topical BID  . vitamin B-12  1,000 mcg Oral Daily   Infusions:  . sodium chloride      Labs: Recent Labs    01/01/18 1607 01/02/18 0757  NA 139 136  K 4.5 4.2  CL 103 104  CO2 27 25  GLUCOSE 143* 107*  BUN 21 19  CREATININE 1.23 1.22  CALCIUM 9.5 8.7*  MG 2.1  --    No results for input(s): AST, ALT, ALKPHOS, BILITOT, PROT, ALBUMIN in the last 72 hours. Recent Labs    01/01/18 1607  WBC 6.8  HGB 14.6  HCT 42.9  MCV 97.3  PLT 181   Recent Labs    01/01/18 1607 01/01/18 2233 01/02/18 0154 01/02/18 0757  TROPONINI <0.03 0.03* 0.03* <0.03   Invalid input(s): POCBNP No results for input(s): HGBA1C in the last 72 hours.   Weights: Filed Weights   01/01/18 2001 01/02/18 0312 01/03/18 0353  Weight: 185 lb 12.8 oz (84.3 kg) 187 lb 6.4 oz (85 kg) 185 lb 3.2 oz (84  kg)     Radiology/Studies:  Ct Chest Wo Contrast  Result Date: 01/01/2018 CLINICAL DATA:  Weakness and fatigue. Shortness of breath. Dyspnea. Leg swelling. EXAM: CT CHEST WITHOUT CONTRAST TECHNIQUE: Multidetector CT imaging of the chest was performed following the standard protocol without IV contrast. COMPARISON:  01/01/2018 FINDINGS: Cardiovascular: Coronary, aortic arch, and branch vessel atherosclerotic vascular disease. Aortic and mitral valve  calcifications. Mild cardiomegaly. Heterogeneous density in vascular structures, today's exam does not exclude pulmonary embolus. Layered calcification along the aortic arch possibly from chronic intramural thrombus, chronic penetrating ulcer, or a small chronic dissection. No hyperdensity in the aortic wall to suggest an acute intramural hematoma. Mediastinum/Nodes: Right hilar node 2.0 cm in short axis on image 66/2. Subcarinal node 1.6 cm in short axis, image 73/2. AP window lymph node 1.1 cm in short axis, image 54/2. Calcified right infrahilar lymph nodes. Lungs/Pleura: Biapical pleuroparenchymal scarring. Coarse peripheral interstitial accentuation with honeycombing. Large confluent bulla posteriorly along the right lower lobe with thickened septations. Airspace opacity in the apical segment left upper lobe. Peripheral interstitial accentuation suggesting fibrosis and possibly usual interstitial pneumonia. Densely calcified granuloma in the right middle lobe. Centrilobular and paraseptal emphysema. Upper Abdomen: Upper abdominal aortic atherosclerotic calcification. Possible left kidney upper pole nephrolithiasis. Old granulomatous disease of the spleen. 1.3 by 1.8 cm right adrenal mass with internal density 15 Hounsfield units, nonspecific. Musculoskeletal: Unremarkable IMPRESSION: 1. 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 2. Airspace opacity in the apical segment left upper lobe, potentially from superimposed pneumonia. 3. There is also biapical pleuroparenchymal scarring and old granulomatous disease. 4. Aortic Atherosclerosis (ICD10-I70.0). Coronary atherosclerosis with aortic and mitral valve calcification as well as mild cardiomegaly. 5. Heterogeneous density in the vascular structures, cannot exclude pulmonary embolus. Today's exam was performed without IV contrast. 6. Layered calcification along the aortic arch potentially from chronic  penetrating ulcer or a small chronic dissection. 7. Centrilobular and paraseptal emphysema. 8. There is a posterior large bulla in the right lower lobe with thickened margins, possibly from mild superinfection, rarely lung cancer can appear this way. 9. Possible left kidney upper pole nephrolithiasis. 10. Nonspecific 1.8 cm right adrenal mass. 11.  Emphysema (ICD10-J43.9). Electronically Signed   By: Van Clines M.D.   On: 01/01/2018 22:05   Dg Chest Portable 1 View  Result Date: 01/01/2018 CLINICAL DATA:  Chest pain. EXAM: PORTABLE CHEST 1 VIEW COMPARISON:  One-view chest x-ray 04/18/2017 FINDINGS: Heart is mildly enlarged. Chronic interstitial coarsening is again seen. Defibrillator pad is in place. No superimposed edema or effusion is present. No focal airspace disease is present. Degenerative changes are noted in the lower cervical spine and bilateral shoulders. IMPRESSION: 1. Stable chronic interstitial coarsening of both lungs. 2. No superimposed disease.  No acute cardiopulmonary disease. Electronically Signed   By: San Morelle M.D.   On: 01/01/2018 16:37     Assessment and Recommendation  84 y.o. male with essential hypertension mixed hyperlipidemia on appropriate medication management with significant weakness also be consistent with diastolic dysfunction heart failure approved at this time 1.  Continue amlodipine for hypertension control 2.  High intensity cholesterol therapy for further risk reduction cardiovascular event 3.  Begin ambulation and follow for improvements of symptoms and possible discharged home from cardiac standpoint and follow-up in next week for further assessment and treatment of second-degree type I AV block 4.  No further cardiac diagnostics necessary at this time  Signed, Serafina Royals M.D. FACC

## 2018-01-03 NOTE — Discharge Summary (Signed)
[facility] NAME: Paul Cross    MR#:  [id number]  DATE OF BIRTH:  Apr 03, 1918  DATE OF ADMISSION:  01/01/2018 ADMITTING PHYSICIAN: Gorden Harms, MD  DATE OF DISCHARGE: 01/03/2018  1:15 PM  PRIMARY CARE PHYSICIAN: [facility]    ADMISSION DIAGNOSIS:  Second degree AV block, Mobitz type II [I44.1]  DISCHARGE DIAGNOSIS:  Active Problems:   Respiratory failure ([place])   SECONDARY DIAGNOSIS:   Past Medical History:  Diagnosis Date  . Hyperlipidemia   . Hypertension   . Hypothyroidism    approximately 2009    HOSPITAL COURSE:   84 year old male with past medical history of pretension, hyperlipidemia, hypothyroidism who presents to the hospital due to shortness of breath, weakness and noted to be hypoxic and also noted to have a type I AV nodal block on telemetry.  1.  Acute respiratory failure with hypoxia- this was secondary to patient's underlying COPD/pulmonary fibrosis/interstitial lung disease. - Patient has a long history of tobacco abuse but quit many years ago.  His CT scan of the chest on admission was consistent with emphysema, pulmonary fibrosis and also bullous lung disease.  This was the source of patient's hypoxemia. -A pulmonary consult was obtained.  The recommended outpatient follow-up and patient was started on some Advair.  He also qualified for oxygen at home which was arranged for him prior to discharge.  2.  Acute Mobitz type I AV block- noted on telemetry and on admission. -Patient's echocardiogram showed normal ejection fraction with no wall motion abnormalities. -Patient was observed on telemetry had no further arrhythmias.  He was seen by cardiology who did not recommend any further intervention.  He will follow-up with them as an outpatient.  3. COPD/Emphysema/Interstitial Lung disease -in by pulmonary, started on some Advair, also arrange for home oxygen prior to discharge.  No  acute exacerbation.  4. Hypothyroidism - he will cont. Synthroid.   5.  HTN - he will cont. Norvasc  6. BPH - he will cont. Flomax.   7. Hyperlipidemia - he will cont. Atorvastatin    DISCHARGE CONDITIONS:   Stable  CONSULTS OBTAINED:  Treatment Team:  Corey Skains, MD Erby Pian, MD  DRUG ALLERGIES:   Allergies  Allergen Reactions  . Diphenhydramine Hcl Other (See Comments)    Reaction:  Unknown   . Simvastatin Other (See Comments)    Reaction:  Unknown     DISCHARGE MEDICATIONS:   Allergies as of 01/03/2018      Reactions   Diphenhydramine Hcl Other (See Comments)   Reaction:  Unknown    Simvastatin Other (See Comments)   Reaction:  Unknown       Medication List    TAKE these medications   amLODipine 5 MG tablet Commonly known as:  NORVASC Take 5 mg by mouth daily.   aspirin EC 81 MG tablet Take 81 mg by mouth daily.   atorvastatin 40 MG tablet Commonly known as:  LIPITOR Take 20 mg by mouth at bedtime.   Fluticasone-Salmeterol 250-50 MCG/DOSE Aepb Commonly known as:  ADVAIR Inhale 1 puff into the lungs 2 (two) times daily.   hydrocortisone cream 1 % Apply 1 application topically 2 (two) times daily. FOR TAILBONE   levothyroxine 137 MCG tablet Commonly known as:  SYNTHROID, LEVOTHROID Take 137 mcg by mouth daily before breakfast.   multivitamin capsule Take 1 capsule by mouth daily.   polyethylene glycol packet Commonly known as:  MIRALAX / GLYCOLAX Take 17 g by mouth daily as needed for mild constipation.   tamsulosin 0.4 MG Caps capsule Commonly known as:  FLOMAX Take 0.4 mg by mouth daily.   triamcinolone ointment 0.1 % Commonly known as:  KENALOG Apply 1 application topically 2 (two) times daily.   vitamin B-12 1000 MCG tablet Commonly known as:  CYANOCOBALAMIN Take 1,000 mcg by mouth daily.   zinc oxide 20 % ointment Apply 1 application topically 2 (two) times daily.            Durable Medical Equipment   (From admission, onward)        Start     Ordered   01/03/18 1018  For home use only DME oxygen  Once    Question Answer Comment  Mode or (Route) Nasal cannula   Liters per Minute 2   Frequency Continuous (stationary and portable oxygen unit needed)   Oxygen conserving device Yes   Oxygen delivery system Gas      01/03/18 1019        DISCHARGE INSTRUCTIONS:   DIET:  Cardiac diet  DISCHARGE CONDITION:  Stable  ACTIVITY:  Activity as tolerated  OXYGEN:  Home Oxygen: Yes.     Oxygen Delivery: 2 liters/min via Patient connected to nasal cannula oxygen  DISCHARGE LOCATION:  Home with Home Health PT, RN   If you experience worsening of your admission symptoms, develop shortness of breath, life threatening emergency, suicidal or homicidal thoughts you must seek medical attention immediately by calling 911 or calling your MD immediately  if symptoms less severe.  You Must read complete instructions/literature along with all the possible adverse reactions/side effects for all the Medicines you take and that have been prescribed to you. Take any new Medicines after you have completely understood and accpet all the possible adverse reactions/side effects.   Please note  You were cared for by a hospitalist during your hospital stay. If you have any questions about your discharge medications or the care you received while you were in the hospital after you are discharged, you can call the unit and asked to speak with the hospitalist on call if the hospitalist that took care of you is not available. Once you are discharged, your primary care physician will handle any further medical issues. Please note that NO REFILLS for any discharge medications will be authorized once you are discharged, as it is imperative that you return to your primary care physician (or establish a relationship with a primary care physician if you do not have one) for your aftercare needs so that they can  reassess your need for medications and monitor your lab values.     Today   Shortness of breath much improved since admission.  No chest pain, myoclonic jerks or any other associated symptoms.  Will discharge home with home oxygen today.  VITAL SIGNS:  Blood pressure 131/62, pulse 73, temperature 97.6 F (36.4 C), temperature source Oral, resp. rate 18, height 6' (1.829 m), weight 84 kg (185 lb 3.2 oz), SpO2 95 %.  I/O:    Intake/Output Summary (Last 24 hours) at 01/03/2018 1552 Last data filed at 01/03/2018 1022 Gross per 24 hour  Intake 360 ml  Output 300 ml  Net 60 ml    PHYSICAL EXAMINATION:   GENERAL:  84 y.o.-year-old patient sitting up in bed in no acute distress.  EYES: Pupils equal, round, reactive to light and accommodation. No scleral icterus. Extraocular muscles intact.  HEENT: Head atraumatic,  normocephalic. Oropharynx and nasopharynx clear.  NECK:  Supple, no jugular venous distention. No thyroid enlargement, no tenderness.  LUNGS: Normal breath sounds bilaterally, no wheezing, rales, rhonchi. No use of accessory muscles of respiration.  CARDIOVASCULAR: S1, S2 normal. No murmurs, rubs, or gallops.  ABDOMEN: Soft, nontender, nondistended. Bowel sounds present. No organomegaly or mass.  EXTREMITIES: No cyanosis, clubbing or edema b/l.    NEUROLOGIC: Cranial nerves II through XII are intact. No focal Motor or sensory deficits b/l.   PSYCHIATRIC: The patient is alert and oriented x 3.  SKIN: No obvious rash, lesion, or ulcer.    DATA REVIEW:   CBC Recent Labs  Lab 01/01/18 1607  WBC 6.8  HGB 14.6  HCT 42.9  PLT 181    Chemistries  Recent Labs  Lab 01/01/18 1607 01/02/18 0757  NA 139 136  K 4.5 4.2  CL 103 104  CO2 27 25  GLUCOSE 143* 107*  BUN 21 19  CREATININE 1.23 1.22  CALCIUM 9.5 8.7*  MG 2.1  --     Cardiac Enzymes Recent Labs  Lab 01/02/18 [place] <0.03    Microbiology Results  No results found for this or any previous  visit.  RADIOLOGY:  Ct Chest Wo Contrast  Result Date: 01/01/2018 CLINICAL DATA:  Weakness and fatigue. Shortness of breath. Dyspnea. Leg swelling. EXAM: CT CHEST WITHOUT CONTRAST TECHNIQUE: Multidetector CT imaging of the chest was performed following the standard protocol without IV contrast. COMPARISON:  01/01/2018 FINDINGS: Cardiovascular: Coronary, aortic arch, and branch vessel atherosclerotic vascular disease. Aortic and mitral valve calcifications. Mild cardiomegaly. Heterogeneous density in vascular structures, today's exam does not exclude pulmonary embolus. Layered calcification along the aortic arch possibly from chronic intramural thrombus, chronic penetrating ulcer, or a small chronic dissection. No hyperdensity in the aortic wall to suggest an acute intramural hematoma. Mediastinum/Nodes: Right hilar node 2.0 cm in short axis on image 66/2. Subcarinal node 1.6 cm in short axis, image 73/2. AP window lymph node 1.1 cm in short axis, image 54/2. Calcified right infrahilar lymph nodes. Lungs/Pleura: Biapical pleuroparenchymal scarring. Coarse peripheral interstitial accentuation with honeycombing. Large confluent bulla posteriorly along the right lower lobe with thickened septations. Airspace opacity in the apical segment left upper lobe. Peripheral interstitial accentuation suggesting fibrosis and possibly usual interstitial pneumonia. Densely calcified granuloma in the right middle lobe. Centrilobular and paraseptal emphysema. Upper Abdomen: Upper abdominal aortic atherosclerotic calcification. Possible left kidney upper pole nephrolithiasis. Old granulomatous disease of the spleen. 1.3 by 1.8 cm right adrenal mass with internal density 15 Hounsfield units, nonspecific. Musculoskeletal: Unremarkable IMPRESSION: 1. 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 2. Airspace opacity in the apical segment left upper lobe, potentially from  superimposed pneumonia. 3. There is also biapical pleuroparenchymal scarring and old granulomatous disease. 4. Aortic Atherosclerosis (ICD10-I70.0). Coronary atherosclerosis with aortic and mitral valve calcification as well as mild cardiomegaly. 5. Heterogeneous density in the vascular structures, cannot exclude pulmonary embolus. Today's exam was performed without IV contrast. 6. Layered calcification along the aortic arch potentially from chronic penetrating ulcer or a small chronic dissection. 7. Centrilobular and paraseptal emphysema. 8. There is a posterior large bulla in the right lower lobe with thickened margins, possibly from mild superinfection, rarely lung cancer can appear this way. 9. Possible left kidney upper pole nephrolithiasis. 10. Nonspecific 1.8 cm right adrenal mass. 11.  Emphysema (ICD10-J43.9). Electronically Signed   By: Van Clines M.D.   On: 01/01/2018 22:05   Dg  Chest Portable 1 View  Result Date: 01/01/2018 CLINICAL DATA:  Chest pain. EXAM: PORTABLE CHEST 1 VIEW COMPARISON:  One-view chest x-ray 04/18/2017 FINDINGS: Heart is mildly enlarged. Chronic interstitial coarsening is again seen. Defibrillator pad is in place. No superimposed edema or effusion is present. No focal airspace disease is present. Degenerative changes are noted in the lower cervical spine and bilateral shoulders. IMPRESSION: 1. Stable chronic interstitial coarsening of both lungs. 2. No superimposed disease.  No acute cardiopulmonary disease. Electronically Signed   By: San Morelle M.D.   On: 01/01/2018 16:37      Management plans discussed with the patient, family and they are in agreement.  CODE STATUS:     Code Status Orders  (From admission, onward)        Start     Ordered   01/01/18 1951  Full code  Continuous     01/01/18 1950     TOTAL TIME TAKING CARE OF THIS PATIENT: 40 minutes.    Henreitta Leber M.D on 01/03/2018 at 3:52 PM  Between 7am to 6pm - Pager -  [phone]  After 6pm go to [email or web address] - [facility]  Office  [phone]  CC: Primary care physician; [facility]

## 2018-01-03 NOTE — Discharge Instructions (Signed)

## 2018-01-03 NOTE — Progress Notes (Signed)
SATURATION QUALIFICATIONS: (This note is used to comply with regulatory documentation for home oxygen)  Patient Saturations on Room Air at Rest = 95%  Patient Saturations on Room Air while Ambulating = 84%  Patient Saturations on 2 Liters of oxygen while Ambulating = 89%  Please briefly explain why patient needs home oxygen: N/A Paul Cross

## 2018-01-03 NOTE — Care Management Note (Signed)
Case Management Note  Patient Details  Name: Paul Cross MRN: [id number] Date of Birth: June 16, 1918   Corene Cornea with [facility] delivered portable tank prior to discharge.  Daughter transported at discharge.   Subjective/Objective:                    Action/Plan:   Expected Discharge Date:  01/03/18               Expected Discharge Plan:  [place]  In-House Referral:     Discharge planning Services  CM Consult  Post Acute Care Choice:  Durable Medical Equipment, Home Health Choice offered to:  Patient  DME Arranged:  Oxygen DME Agency:  [facility] Arranged:  RN, PT [facility] Agency:  [facility]  Status of Service:  Completed, signed off  If discussed at [place] of Stay Meetings, dates discussed:    Additional Comments:  Beverly Sessions, RN 01/03/2018, 4:31 PM

## 2018-01-04 ENCOUNTER — Telehealth: Payer: Self-pay | Admitting: *Deleted

## 2018-01-04 NOTE — Telephone Encounter (Signed)
-----   Message from Laverle Hobby, MD sent at 01/02/2018  2:49 PM EDT ----- Regarding: hfu Pt needs hfu in about 4 weeks for COPD, pulmonary fibrosis.

## 2018-01-04 NOTE — Telephone Encounter (Signed)
Number was busy  °Will try again at a later time  °

## 2018-01-05 NOTE — Telephone Encounter (Signed)
Pt scheduled 01/31/18 with Dr Ashby Dawes

## 2018-01-08 ENCOUNTER — Telehealth: Payer: Self-pay

## 2018-01-08 NOTE — Telephone Encounter (Signed)
Flagged on EMMI report for not knowing who to call about changes in condition.  Called and spoke with Paul Cross, patient's daughter, who is staying with patient.  She mentioned she completed both EMMI calls on patient's behalf.  She mentioned at the time of first call, she could not remember who her father needed to follow up with, however has since had a chance to look over discharge papers to find out.  He does have follow up appointments scheduled.  No other questions or concerns, though she did mention on the second EMMI call that she answered yes to the question pertaining to feeling down as her father has been feeling a little down with not bouncing immediately back to his normal self as he would like to. No other questions or concerns at this time.  I thanked her for participating in the [facility] calls.

## 2018-01-10 ENCOUNTER — Inpatient Hospital Stay: Payer: Medicare Other | Admitting: Internal Medicine

## 2018-01-15 ENCOUNTER — Emergency Department: Payer: Medicare Other

## 2018-01-15 ENCOUNTER — Emergency Department
Admission: EM | Admit: 2018-01-15 | Discharge: 2018-01-15 | Disposition: A | Discharge status: Home / Self Care | Payer: Medicare Other | Attending: Student in an Organized Health Care Education/Training Program | Admitting: Student in an Organized Health Care Education/Training Program

## 2018-01-15 DIAGNOSIS — B029 Zoster without complications: Secondary | ICD-10-CM | POA: Diagnosis not present

## 2018-01-15 DIAGNOSIS — Z87891 Personal history of nicotine dependence: Secondary | ICD-10-CM | POA: Diagnosis not present

## 2018-01-15 DIAGNOSIS — E039 Hypothyroidism, unspecified: Secondary | ICD-10-CM | POA: Insufficient documentation

## 2018-01-15 DIAGNOSIS — R1011 Right upper quadrant pain: Secondary | ICD-10-CM

## 2018-01-15 DIAGNOSIS — Z7982 Long term (current) use of aspirin: Secondary | ICD-10-CM | POA: Insufficient documentation

## 2018-01-15 DIAGNOSIS — Z79899 Other long term (current) drug therapy: Secondary | ICD-10-CM | POA: Insufficient documentation

## 2018-01-15 DIAGNOSIS — I10 Essential (primary) hypertension: Secondary | ICD-10-CM | POA: Insufficient documentation

## 2018-01-15 DIAGNOSIS — R1084 Generalized abdominal pain: Secondary | ICD-10-CM | POA: Diagnosis present

## 2018-01-15 LAB — CBC WITH DIFFERENTIAL/PLATELET
BASOS PCT: 1 %
Basophils Absolute: 0.1 10*3/uL (ref 0–0.1)
EOS ABS: 0.2 10*3/uL (ref 0–0.7)
EOS PCT: 3 %
HCT: 41.4 % (ref 40.0–52.0)
HEMOGLOBIN: 14.2 g/dL (ref 13.0–18.0)
LYMPHS ABS: 1.7 10*3/uL (ref 1.0–3.6)
Lymphocytes Relative: 26 %
MCH: 32.9 pg (ref 26.0–34.0)
MCHC: 34.2 g/dL (ref 32.0–36.0)
MCV: 96.2 fL (ref 80.0–100.0)
MONO ABS: 0.8 10*3/uL (ref 0.2–1.0)
MONOS PCT: 12 %
NEUTROS PCT: 58 %
Neutro Abs: 4 10*3/uL (ref 1.4–6.5)
Platelets: 220 10*3/uL (ref 150–440)
RBC: 4.3 MIL/uL — ABNORMAL LOW (ref 4.40–5.90)
RDW: 13.6 % (ref 11.5–14.5)
WBC: 6.8 10*3/uL (ref 3.8–10.6)

## 2018-01-15 LAB — COMPREHENSIVE METABOLIC PANEL
ALBUMIN: 3.9 g/dL (ref 3.5–5.0)
ALK PHOS: 42 U/L (ref 38–126)
ALT: 18 U/L (ref 0–44)
AST: 24 U/L (ref 15–41)
Anion gap: 9 (ref 5–15)
BUN: 26 mg/dL — AB (ref 8–23)
CHLORIDE: 105 mmol/L (ref 98–111)
CO2: 27 mmol/L (ref 22–32)
Calcium: 9.7 mg/dL (ref 8.9–10.3)
Creatinine, Ser: 1.04 mg/dL (ref 0.61–1.24)
GFR calc Af Amer: >60 mL/min (ref >60)
GFR calc non Af Amer: 58 mL/min — ABNORMAL LOW (ref >60)
GLUCOSE: 111 mg/dL — AB (ref 70–99)
Potassium: 4.4 mmol/L (ref 3.5–5.1)
SODIUM: 141 mmol/L (ref 135–145)
Total Bilirubin: 1 mg/dL (ref 0.3–1.2)
Total Protein: 7.3 g/dL (ref 6.5–8.1)

## 2018-01-15 LAB — LIPASE, BLOOD: Lipase: 30 U/L (ref 11–51)

## 2018-01-15 LAB — LACTIC ACID, PLASMA: Lactic Acid, Venous: 1.7 mmol/L (ref 0.5–1.9)

## 2018-01-15 LAB — TROPONIN I: Troponin I: <0.03 ng/mL (ref <0.03)

## 2018-01-15 MED ORDER — VALACYCLOVIR HCL 500 MG PO TABS: 500.0000 mg | ORAL_TABLET | Freq: Three times a day (TID) | ORAL | 0 refills | Status: DC

## 2018-01-15 MED ORDER — VALACYCLOVIR HCL 500 MG PO TABS: 1000.0000 mg | ORAL_TABLET | Freq: Three times a day (TID) | ORAL | 0 refills | Status: AC

## 2018-01-15 MED ORDER — LIDOCAINE 5 % EX PTCH
1.0000 | MEDICATED_PATCH | CUTANEOUS | Status: DC
  Administered 2018-01-15: 1 via TRANSDERMAL
  Filled 2018-01-15: qty 1

## 2018-01-15 MED ORDER — LACTULOSE 10 G PO PACK: 10.0000 g | PACK | Freq: Every day | ORAL | 0 refills | Status: DC

## 2018-01-15 MED ORDER — LIDOCAINE 5 % EX PTCH: 1.0000 | MEDICATED_PATCH | Freq: Two times a day (BID) | CUTANEOUS | 0 refills | Status: DC

## 2018-01-15 MED ORDER — ACETAMINOPHEN 500 MG PO TABS
1000.0000 mg | ORAL_TABLET | Freq: Once | ORAL | Status: AC
  Administered 2018-01-15: 1000 mg via ORAL
  Filled 2018-01-15: qty 2

## 2018-01-15 MED ORDER — LIDOCAINE 5 % EX PTCH: 1.0000 | MEDICATED_PATCH | CUTANEOUS | 0 refills | Status: DC

## 2018-01-15 MED ORDER — LACTULOSE 10 G PO PACK: 10.0000 g | PACK | Freq: Three times a day (TID) | ORAL | 0 refills | Status: DC

## 2018-01-15 NOTE — ED Notes (Signed)
.   Pt is resting, Respirations even and unlabored, NAD. Stretcher lowest postion and locked. Call bell within reach. Denies any needs at this time RN will continue to monitor.  Family at bedside.  

## 2018-01-15 NOTE — ED Provider Notes (Signed)
[facility] Emergency Department Provider Note    First MD Initiated Contact with Patient 01/15/18 0840     (approximate)  I have reviewed the triage vital signs and the nursing notes.   HISTORY  Chief Complaint Abdominal Pain    HPI Paul Cross is a 84 y.o. male presents the ER with chief complaint of right-sided abdominal pain.  Patient with recent admission to the hospital for respiratory failure with hypoxia and Mobitz type I AV block on telemetry.  After being discharged home patient states that he has felt that he has had some steady decline.  Denies any chest pain at this time.  No weakness or shortness of breath.  No fevers.  States he is worried that he is constipated.  Is complaining of right-sided abdominal pain but he is status post cholecystectomy.  Denies any fevers.  No vomiting.    Past Medical History:  Diagnosis Date  . Hyperlipidemia   . Hypertension   . Hypothyroidism    approximately 2009   Family History  Problem Relation Age of Onset  . Cancer Sister        breast  . Diabetes Brother    Past Surgical History:  Procedure Laterality Date  . CHOLECYSTECTOMY N/A 04/16/2015   Procedure: LAPAROSCOPIC CHOLECYSTECTOMY;  Surgeon: Clayburn Pert, MD;  Location: [facility] ORS;  Service: General;  Laterality: N/A;  . FEMORAL ARTERY REPAIR     Patient Active Problem List   Diagnosis Date Noted  . Respiratory failure ([place]) 01/01/2018  . Aftercare following surgery 05/04/2015      Prior to Admission medications   Medication Sig Start Date End Date Taking? Authorizing Provider  acetaminophen (TYLENOL) 325 MG tablet Take 975 mg by mouth 3 (three) times daily as needed for mild pain, moderate pain, fever or headache.   Yes [provider]  amLODipine (NORVASC) 5 MG tablet Take 5 mg by mouth daily.   Yes [provider]  aspirin EC 81 MG tablet Take 81 mg by mouth daily.   Yes [provider]    atorvastatin (LIPITOR) 40 MG tablet Take 20 mg by mouth at bedtime.   Yes [provider]  cholecalciferol (VITAMIN D) 1000 units tablet Take 1,000 Units by mouth daily.   Yes [provider]  Fluticasone-Salmeterol (ADVAIR) 250-50 MCG/DOSE AEPB Inhale 1 puff into the lungs 2 (two) times daily. 01/03/18  Yes Sainani, Belia Heman, MD  hydrocortisone cream 1 % Apply 1 application topically 2 (two) times daily. FOR TAILBONE   Yes [provider]  levothyroxine (SYNTHROID, LEVOTHROID) 137 MCG tablet Take 137 mcg by mouth daily before breakfast.    Yes [provider]  Multiple Vitamin (MULTIVITAMIN) capsule Take 1 capsule by mouth daily.   Yes [provider]  polyethylene glycol (MIRALAX / GLYCOLAX) packet Take 17 g by mouth daily as needed for mild constipation.   Yes [provider]  tamsulosin (FLOMAX) 0.4 MG CAPS capsule Take 0.4 mg by mouth daily.   Yes [provider]  vitamin B-12 (CYANOCOBALAMIN) 1000 MCG tablet Take 1,000 mcg by mouth daily.   Yes [provider]  lactulose (CEPHULAC) 10 g packet Take 1 packet (10 g total) by mouth at bedtime. 01/15/18   Merlyn Lot, MD  lidocaine (LIDODERM) 5 % Place 1 patch onto the skin daily. Remove & Discard patch within 12 hours or as directed by MD 01/15/18 01/15/19  Merlyn Lot, MD  valACYclovir (VALTREX) 500 MG tablet Take  2 tablets (1,000 mg total) by mouth 3 (three) times daily for 7 days. 01/15/18 01/22/18  Merlyn Lot, MD    Allergies Diphenhydramine hcl and Simvastatin    Social History Social History   Tobacco Use  . Smoking status: Former Research scientist (life sciences)  . Smokeless tobacco: Former Network engineer Use Topics  . Alcohol use: No  . Drug use: No    Review of Systems Patient denies headaches, rhinorrhea, blurry vision, numbness, shortness of breath, chest pain, edema, cough, abdominal pain, nausea, vomiting, diarrhea, dysuria, fevers, rashes or hallucinations  unless otherwise stated above in HPI. ____________________________________________   PHYSICAL EXAM:  VITAL SIGNS: Vitals:   01/15/18 1200 01/15/18 1230  BP: (!) 173/77 137/67  Pulse: 68 69  Resp:    Temp:    SpO2: 92% 97%    Constitutional: Alert and oriented.  Eyes: Conjunctivae are normal.  Head: Atraumatic. Nose: No congestion/rhinnorhea. Mouth/Throat: Mucous membranes are moist.   Neck: No stridor. Painless ROM.  Cardiovascular: Normal rate, regular rhythm. Grossly normal heart sounds.  Good peripheral circulation. Respiratory: Normal respiratory effort.  No retractions. Lungs CTAB. Gastrointestinal: Soft with right sided ttp,  No distention. Dermatomal rash on right flank at approx t10 No abdominal bruits. No CVA tenderness. Genitourinary:  Musculoskeletal: No lower extremity tenderness nor edema.  No joint effusions. Neurologic:  Normal speech and language. No gross focal neurologic deficits are appreciated. No facial droop Skin:  Skin is warm, dry and intact. No rash noted. Psychiatric: Mood and affect are normal. Speech and behavior are normal.  ____________________________________________   LABS (all labs ordered are listed, but only abnormal results are displayed)  Results for orders placed or performed during the hospital encounter of 01/15/18 (from the past 24 hour(s))  CBC with Differential/Platelet     Status: Abnormal   Collection Time: 01/15/18  8:44 AM  Result Value Ref Range   WBC 6.8 3.8 - 10.6 K/uL   RBC 4.30 (L) 4.40 - 5.90 MIL/uL   Hemoglobin 14.2 13.0 - 18.0 g/dL   HCT 41.4 40.0 - 52.0 %   MCV 96.2 80.0 - 100.0 fL   MCH 32.9 26.0 - 34.0 pg   MCHC 34.2 32.0 - 36.0 g/dL   RDW 13.6 11.5 - 14.5 %   Platelets 220 150 - 440 K/uL   Neutrophils Relative % 58 %   Neutro Abs 4.0 1.4 - 6.5 K/uL   Lymphocytes Relative 26 %   Lymphs Abs 1.7 1.0 - 3.6 K/uL   Monocytes Relative 12 %   Monocytes Absolute 0.8 0.2 - 1.0 K/uL   Eosinophils Relative 3 %    Eosinophils Absolute 0.2 0 - 0.7 K/uL   Basophils Relative 1 %   Basophils Absolute 0.1 0 - 0.1 K/uL  Comprehensive metabolic panel     Status: Abnormal   Collection Time: 01/15/18  8:44 AM  Result Value Ref Range   Sodium 141 135 - 145 mmol/L   Potassium 4.4 3.5 - 5.1 mmol/L   Chloride 105 98 - 111 mmol/L   CO2 27 22 - 32 mmol/L   Glucose, Bld 111 (H) 70 - 99 mg/dL   BUN 26 (H) 8 - 23 mg/dL   Creatinine, Ser 1.04 0.61 - 1.24 mg/dL   Calcium 9.7 8.9 - 10.3 mg/dL   Total Protein 7.3 6.5 - 8.1 g/dL   Albumin 3.9 3.5 - 5.0 g/dL   AST 24 15 - 41 U/L   ALT 18 0 - 44 U/L   Alkaline Phosphatase  42 38 - 126 U/L   Total Bilirubin 1.0 0.3 - 1.2 mg/dL   GFR calc non Af Amer 58 (L) >60 mL/min   GFR calc Af Amer >60 >60 mL/min   Anion gap 9 5 - 15  Lipase, blood     Status: None   Collection Time: 01/15/18  8:44 AM  Result Value Ref Range   Lipase 30 11 - 51 U/L  Troponin I     Status: None   Collection Time: 01/15/18  8:44 AM  Result Value Ref Range   Troponin I <0.03 <0.03 ng/mL  Lactic acid, plasma     Status: None   Collection Time: 01/15/18  8:44 AM  Result Value Ref Range   Lactic Acid, Venous 1.7 0.5 - 1.9 mmol/L   ____________________________________________  EKG My review and personal interpretation at Time: 8:46   Indication: abd pain  Rate: 85  Rhythm: first degree av block, Axis: left Other: lbbb, no stemi ____________________________________________  RADIOLOGY  I personally reviewed all radiographic images ordered to evaluate for the above acute complaints and reviewed radiology reports and findings.  These findings were personally discussed with the patient.  Please see medical record for radiology report.  ____________________________________________   PROCEDURES  Procedure(s) performed:  Procedures    Critical Care performed: no ____________________________________________   INITIAL IMPRESSION / ASSESSMENT AND PLAN / ED COURSE  Pertinent labs &  imaging results that were available during my care of the patient were reviewed by me and considered in my medical decision making (see chart for details).   DDX: Shingles, constipation, SBO, perforation, dehydration, lecture light abnormality  Paul Cross is a 84 y.o. who presents to the ED with symptoms as described above.  Physical exam does show some tenderness.  After discussion with family does appear that he has a new dermatomal rash.  Based on his age and discussion with family as the rash does appear that it could be related to old she was outbreak radiographs, blood work and CT imaging ordered to evaluate for the above differential.  Work-up in the ER is reassuring.  Patient's pain controlled.  Symptoms seem most clinically consistent with shingles.  Started on Valtrex as well as topical pain medication.      As part of my medical decision making, I reviewed the following data within the [facility] notes reviewed and incorporated, Labs reviewed, notes from prior ED visits.  ____________________________________________   FINAL CLINICAL IMPRESSION(S) / ED DIAGNOSES  Final diagnoses:  Herpes zoster without complication  Right upper quadrant abdominal pain      NEW MEDICATIONS STARTED DURING THIS VISIT:  Discharge Medication List as of 01/15/2018 12:04 PM       Note:  This document was prepared using Dragon voice recognition software and may include unintentional dictation errors.    Merlyn Lot, MD 01/15/18 [phone]

## 2018-01-15 NOTE — Discharge Instructions (Addendum)

## 2018-01-15 NOTE — ED Triage Notes (Signed)
Pt arrives via [facility] from Home abd pain for few days constipation possibly. Pt took OTC and had BM no improvement in pain tender to palpation gallbladder is removed.Family from home Independent , has home 02. 160/90 RBB12 lead 18G LFarm NKA Daugther is responsbile for meds

## 2018-01-18 NOTE — Progress Notes (Signed)
Synopsis: Referred in August 2019 for Dyspnea, hypoxemia.  He smoked 1 pack of cigarettes daily for at least 50 years, quit around age 84.  He worked as a Dietitian over the years.  Served in [employer] [place] [employer] in [place] and in [place].  Subjective:   [name] GENDER: male DOB: 03-05-1918, MRN: [id number]   HPI  Chief Complaint  Patient presents with  . New Consult    Shortness of breath during activities   Paul Cross is here to see me today for evaluation of new onset hypoxemia.  He is a veteran of World War II where he served in [place] as well as in [place] and then afterwards went to college and became a Dietitian.  He says he worked with formaldehyde for many years, at least 44 years and also smoked 1 pack of cigarettes daily from age 77 until about age 46.  Prior to hospitalization in July 2019 he was never aware of any underlying lung disease.  However, he tells me that about 6 to 8 months ago he started noticing that he would feel more short of breath when he would walk a few blocks in his neighborhood which was unusual for him.  Despite this he remained quite active cutting his grass and weed eating by himself in a fairly large lawn.  On January 01, 2018 he was brought to the emergency department at [facility] where records note he was complaining of generalized weakness and fatigue, had the sudden onset of abdominal pain on the right side, and some shortness of breath.  He had difficulty sleeping because he felt like things were worsening.  During his work-up he was found to have pulmonary fibrosis, centrilobular emphysema, and possible pneumonia.  He was discharged on oxygen.  He was started on Advair.  He has been using oxygen at home continuously since then though he says sometimes he will accidentally take it off in the middle the night.  He says he does not think the oxygen or the Advair have helped him much.  He says that he still feels some short  of breath when he is walking around the house but in general he is better than the day he went to the hospital.  He notes some intermittent leg swelling but this has not been too bad.  He coughs, sometimes produces mucus apparently this did not change much around the time of admission to the hospital.  Past Medical History:  Diagnosis Date  . Hyperlipidemia   . Hypertension   . Hypothyroidism    approximately 2009     Family History  Problem Relation Age of Onset  . Cancer Sister        breast  . Diabetes Brother      Social History   Socioeconomic History  . Marital status: Widowed    Spouse name: Not on file  . Number of children: Not on file  . Years of education: Not on file  . Highest education level: Not on file  Occupational History  . Not on file  Social Needs  . Financial resource strain: Not on file  . Food insecurity:    Worry: Not on file    Inability: Not on file  . Transportation needs:    Medical: Not on file    Non-medical: Not on file  Tobacco Use  . Smoking status: Former Research scientist (life sciences)  . Smokeless tobacco: Former Network engineer and Sexual Activity  .  Alcohol use: No  . Drug use: No  . Sexual activity: Not on file  Lifestyle  . Physical activity:    Days per week: Not on file    Minutes per session: Not on file  . Stress: Not on file  Relationships  . Social connections:    Talks on phone: Not on file    Gets together: Not on file    Attends religious service: Not on file    Active member of club or organization: Not on file    Attends meetings of clubs or organizations: Not on file    Relationship status: Not on file  . Intimate partner violence:    Fear of current or ex partner: Not on file    Emotionally abused: Not on file    Physically abused: Not on file    Forced sexual activity: Not on file  Other Topics Concern  . Not on file  Social History Narrative  . Not on file     Allergies  Allergen Reactions  . Diphenhydramine Hcl  Other (See Comments)    Reaction:  Unknown   . Simvastatin Other (See Comments)    Reaction:  Unknown      Outpatient Medications Prior to Visit  Medication Sig Dispense Refill  . acetaminophen (TYLENOL) 325 MG tablet Take 975 mg by mouth 3 (three) times daily as needed for mild pain, moderate pain, fever or headache.    Marland Kitchen amLODipine (NORVASC) 5 MG tablet Take 5 mg by mouth daily.    Marland Kitchen aspirin EC 81 MG tablet Take 81 mg by mouth daily.    Marland Kitchen atorvastatin (LIPITOR) 40 MG tablet Take 20 mg by mouth at bedtime.    . cholecalciferol (VITAMIN D) 1000 units tablet Take 1,000 Units by mouth daily.    . hydrocortisone cream 1 % Apply 1 application topically 2 (two) times daily. FOR TAILBONE    . lactulose (CEPHULAC) 10 g packet Take 1 packet (10 g total) by mouth at bedtime. 30 each 0  . levothyroxine (SYNTHROID, LEVOTHROID) 137 MCG tablet Take 137 mcg by mouth daily before breakfast.     . lidocaine (LIDODERM) 5 % Place 1 patch onto the skin daily. Remove & Discard patch within 12 hours or as directed by MD 7 patch 0  . Multiple Vitamin (MULTIVITAMIN) capsule Take 1 capsule by mouth daily.    . tamsulosin (FLOMAX) 0.4 MG CAPS capsule Take 0.4 mg by mouth daily.    . vitamin B-12 (CYANOCOBALAMIN) 1000 MCG tablet Take 1,000 mcg by mouth daily.    . Fluticasone-Salmeterol (ADVAIR) 250-50 MCG/DOSE AEPB Inhale 1 puff into the lungs 2 (two) times daily. 60 each 0  . polyethylene glycol (MIRALAX / GLYCOLAX) packet Take 17 g by mouth daily as needed for mild constipation.     No facility-administered medications prior to visit.     Review of Systems  Constitutional: Positive for weight loss. Negative for fever.  HENT: Negative for congestion, ear pain, nosebleeds and sore throat.   Eyes: Negative for redness.  Respiratory: Positive for cough and shortness of breath. Negative for wheezing.   Cardiovascular: Positive for leg swelling. Negative for palpitations and PND.  Gastrointestinal: Negative for  nausea and vomiting.  Genitourinary: Negative for dysuria.  Skin: Positive for rash.  Neurological: Negative for headaches.  Endo/Heme/Allergies: Bruises/bleeds easily.  Psychiatric/Behavioral: Negative for depression. The patient is nervous/anxious.       Objective:  Physical Exam   Vitals:   01/25/18 3785  BP: (!) 112/58  Pulse: 67  SpO2: 95%  Weight: 187 lb (84.8 kg)  Height: 6' (1.829 m)   2L   Gen: well appearing, no acute distress HENT: NCAT, OP clear, neck supple without masses Eyes: PERRL, EOMi Lymph: no cervical lymphadenopathy PULM: Crackles R base, some wheezes there B CV: RRR, no mgr, no JVD GI: BS+, soft, nontender, no hsm Derm: no rash or skin breakdown MSK: normal bulk and tone Neuro: A&Ox4, CN II-XII intact, strength 5/5 in all 4 extremities Psyche: normal mood and affect   CBC    Component Value Date/Time   WBC 6.8 01/15/2018 0844   RBC 4.30 (L) 01/15/2018 0844   HGB 14.2 01/15/2018 0844   HCT 41.4 01/15/2018 0844   PLT 220 01/15/2018 0844   MCV 96.2 01/15/2018 0844   MCH 32.9 01/15/2018 0844   MCHC 34.2 01/15/2018 0844   RDW 13.6 01/15/2018 0844   LYMPHSABS 1.7 01/15/2018 0844   MONOABS 0.8 01/15/2018 0844   EOSABS 0.2 01/15/2018 0844   BASOSABS 0.1 01/15/2018 0844     Chest imaging: July 2019 CT chest > images independently reviewed showing significant centrilobular emphysema and paraseptal emphysema and an upper lobe predominant distribution.  There is also upper lobe predominant honeycombing and a peripheral based distribution with interstitial reticulation, specifically intralobular septal thickening.  Again the fibrotic changes are in an upper lobe predominant fashion though they are notable in the bases as well.  There is also an area of consolidation in the left upper lobe.  There is a very large bulla in the right lower lobe.  PFT:  Labs:  Path:  Echo: July 2019 Echo: LVEF 60-65%, mod LVH, RV dilated, RVSP 35-40   Heart  Catheterization:        Assessment & Plan:   Shortness of breath - Plan: Pulmonary Function Test [facility] Only  Interstitial pulmonary disease ([place]) - Plan: Pulmonary Function Test [facility] Only, CT Chest High Resolution, AMB referral to pulmonary rehabilitation, Ambulatory Referral for DME  Chronic respiratory failure with hypoxia ([place])  Centrilobular emphysema ([place])  Discussion: Mr. Heath Lark has traumatic findings on the CT scan of his chest showing fibrotic change, centrilobular emphysema, a large bulla, and possible pneumonia in the left upper lobe.  I suppose the sudden onset of hypoxemia may have been due to a pneumonia syndrome though interestingly he did not have increase in cough or mucus production or fevers and chills like we would typically expect.  So I think he probably has been dealing with hypoxemia for several years and it was just suddenly recognize while hospitalized.  This area of infiltrate needs to be followed so we will be sure to pay attention to that on a repeat CT scan later this year.  The centrilobular emphysema is not surprising considering his duration of smoking.  We will get a lung function test to quantify it, change Advair to Anoro, and start him on DuoNeb throughout the day.  We will also make recommendations from to do pulmonary rehab.  I do not think he will come off of oxygen given the burden of lung disease he has.  I explained this to him today and will make arrangements for portable oxygen concentrator.  I am not sure what to make of the bulla in the right lower lobe, radiology mentioned the possibility of malignancy.  I think this would be a very rare presentation of malignancy so we will be sure to follow it closely with repeat imaging.  In  regards to the fibrotic change, again am not clear what is causing this.  Given the upper lobe predominant distribution and his occupational history I do question chronic hypersensitivity pneumonitis.  However, in someone  of his age the most likely consideration is usual interstitial pneumonitis and IPF.  I think that we would only make him miserable if we started him on anti-fibrotic therapy.  Plan: For emphysema: Stop Advair Start taking Anoro 1 puff daily Use DuoNeb 3 times a day with a nebulizer machine We will refer you to pulmonary rehab at [facility] We will arrange a lung function test  For chronic respiratory failure with hypoxemia: Keep using 2 L of oxygen continuously We will prescribe a portable oxygen concentrator  For the area of "pneumonia" in the left upper lobe: We will repeat a CT scan of your chest in October  For the pulmonary fibrosis: We will follow-up the results of the lung function test in the high-resolution CT scan of your chest in October  We will see you back in October or sooner if needed   Current Outpatient Medications:  .  acetaminophen (TYLENOL) 325 MG tablet, Take 975 mg by mouth 3 (three) times daily as needed for mild pain, moderate pain, fever or headache., Disp: , Rfl:  .  amLODipine (NORVASC) 5 MG tablet, Take 5 mg by mouth daily., Disp: , Rfl:  .  aspirin EC 81 MG tablet, Take 81 mg by mouth daily., Disp: , Rfl:  .  atorvastatin (LIPITOR) 40 MG tablet, Take 20 mg by mouth at bedtime., Disp: , Rfl:  .  cholecalciferol (VITAMIN D) 1000 units tablet, Take 1,000 Units by mouth daily., Disp: , Rfl:  .  hydrocortisone cream 1 %, Apply 1 application topically 2 (two) times daily. FOR TAILBONE, Disp: , Rfl:  .  lactulose (CEPHULAC) 10 g packet, Take 1 packet (10 g total) by mouth at bedtime., Disp: 30 each, Rfl: 0 .  levothyroxine (SYNTHROID, LEVOTHROID) 137 MCG tablet, Take 137 mcg by mouth daily before breakfast. , Disp: , Rfl:  .  lidocaine (LIDODERM) 5 %, Place 1 patch onto the skin daily. Remove & Discard patch within 12 hours or as directed by MD, Disp: 7 patch, Rfl: 0 .  Multiple Vitamin (MULTIVITAMIN) capsule, Take 1 capsule by mouth  daily., Disp: , Rfl:  .  tamsulosin (FLOMAX) 0.4 MG CAPS capsule, Take 0.4 mg by mouth daily., Disp: , Rfl:  .  vitamin B-12 (CYANOCOBALAMIN) 1000 MCG tablet, Take 1,000 mcg by mouth daily., Disp: , Rfl:  .  ipratropium-albuterol (DUONEB) 0.5-2.5 (3) MG/3ML SOLN, Take 3 mLs by nebulization 3 (three) times daily., Disp: 360 mL, Rfl: 3 .  umeclidinium-vilanterol (ANORO ELLIPTA) 62.5-25 MCG/INH AEPB, Inhale 1 puff into the lungs daily., Disp: 1 each, Rfl: 0 .  umeclidinium-vilanterol (ANORO ELLIPTA) 62.5-25 MCG/INH AEPB, Inhale 1 puff into the lungs daily., Disp: 3 each, Rfl: 3

## 2018-01-25 ENCOUNTER — Ambulatory Visit (INDEPENDENT_AMBULATORY_CARE_PROVIDER_SITE_OTHER): Payer: Medicare Other | Admitting: Pulmonary Disease

## 2018-01-25 ENCOUNTER — Encounter: Payer: Self-pay | Admitting: Pulmonary Disease

## 2018-01-25 VITALS — BP 112/58 | HR 67 | Ht 72.0 in | Wt 187.0 lb

## 2018-01-25 DIAGNOSIS — J432 Centrilobular emphysema: Secondary | ICD-10-CM

## 2018-01-25 DIAGNOSIS — J9611 Chronic respiratory failure with hypoxia: Secondary | ICD-10-CM | POA: Diagnosis not present

## 2018-01-25 DIAGNOSIS — R0602 Shortness of breath: Secondary | ICD-10-CM

## 2018-01-25 DIAGNOSIS — J849 Interstitial pulmonary disease, unspecified: Secondary | ICD-10-CM | POA: Diagnosis not present

## 2018-01-25 MED ORDER — UMECLIDINIUM-VILANTEROL 62.5-25 MCG/INH IN AEPB: 1.0000 | INHALATION_SPRAY | Freq: Every day | RESPIRATORY_TRACT | 3 refills | Status: DC

## 2018-01-25 MED ORDER — UMECLIDINIUM-VILANTEROL 62.5-25 MCG/INH IN AEPB: 1.0000 | INHALATION_SPRAY | Freq: Every day | RESPIRATORY_TRACT | 0 refills | Status: DC

## 2018-01-25 MED ORDER — IPRATROPIUM-ALBUTEROL 0.5-2.5 (3) MG/3ML IN SOLN: 3.0000 mL | Freq: Three times a day (TID) | RESPIRATORY_TRACT | 3 refills | Status: DC

## 2018-01-25 NOTE — Patient Instructions (Signed)
For emphysema: Stop Advair Start taking Anoro 1 puff daily Use DuoNeb 3 times a day with a nebulizer machine We will refer you to pulmonary rehab at [facility] We will arrange a lung function test  For chronic respiratory failure with hypoxemia: Keep using 2 L of oxygen continuously We will prescribe a portable oxygen concentrator  For the area of "pneumonia" in the left upper lobe: We will repeat a CT scan of your chest in October  For the pulmonary fibrosis: We will follow-up the results of the lung function test in the high-resolution CT scan of your chest in October  We will see you back in October or sooner if needed

## 2018-01-26 ENCOUNTER — Telehealth: Payer: Self-pay | Admitting: Pulmonary Disease

## 2018-01-26 NOTE — Telephone Encounter (Signed)
Per 8.8.19 consult note with BQ patient was switched from Advair to Anoro: Patient Instructions  For emphysema: Stop Advair Start taking Anoro 1 puff daily Use DuoNeb 3 times a day with a nebulizer machine We will refer you to pulmonary rehab at [facility] We will arrange a lung function test   For chronic respiratory failure with hypoxemia: Keep using 2 L of oxygen continuously We will prescribe a portable oxygen concentrator   For the area of "pneumonia" in the left upper lobe: We will repeat a CT scan of your chest in October   For the pulmonary fibrosis: We will follow-up the results of the lung function test in the high-resolution CT scan of your chest in October   We will see you back in October or sooner if needed   [facility] TCB x1 for [facility] w/ the [place]

## 2018-01-29 NOTE — Telephone Encounter (Signed)
Linda NP at the [facility] called back, states that pt is on the duoneb as prescribed, but the Anoro is not covered by [employer].    Covered alternatives are Spiriva, Advair, mometasone.    Linda's callback #: [phone]   Sending to dod as pt is using his sample and will be out of medication before BQ returns from vacation.  MW please advise on alternative med.  Thanks!

## 2018-01-29 NOTE — Telephone Encounter (Signed)
Resume advair and add spiriva 1 capsule each am but will need ov with NP to show how to use it once has acquired it thru the va (we don't sample the dpi and I don't think the va has the respimat)

## 2018-01-29 NOTE — Telephone Encounter (Signed)
atc Herma Mering, line would not ring through, automated message states that "the subscriber is unavailable, please call back later".   Wcb.

## 2018-01-29 NOTE — Telephone Encounter (Signed)
Attempted to call [place] with the [place]. I did not receive an answer. I have left a message for Vaughan Basta to return our call.

## 2018-01-30 NOTE — Telephone Encounter (Signed)
Attempted to contact [facility]. Received the same message that [place] received yesterday. Will try back.

## 2018-01-31 ENCOUNTER — Inpatient Hospital Stay: Payer: Medicare Other | Admitting: Internal Medicine

## 2018-01-31 MED ORDER — TIOTROPIUM BROMIDE MONOHYDRATE 18 MCG IN CAPS: 18.0000 ug | ORAL_CAPSULE | Freq: Every day | RESPIRATORY_TRACT | 2 refills | Status: DC

## 2018-01-31 MED ORDER — FLUTICASONE-SALMETEROL 250-50 MCG/DOSE IN AEPB: 1.0000 | INHALATION_SPRAY | Freq: Two times a day (BID) | RESPIRATORY_TRACT | 2 refills | Status: DC

## 2018-01-31 NOTE — Telephone Encounter (Signed)
Called and spoke with pt's daughter Opal Sidles regarding DOD MW recommendations Printed Advair and Spiriva [place] at [phone] spoke with Suanne Marker in pharmacy to verify fax number Faxed the rx over to [place] at [phone]; fax confirmation went through  Nothing further needed.

## 2018-02-01 ENCOUNTER — Telehealth: Payer: Self-pay | Admitting: Pulmonary Disease

## 2018-02-01 MED ORDER — TIOTROPIUM BROMIDE MONOHYDRATE 18 MCG IN CAPS: 18.0000 ug | ORAL_CAPSULE | Freq: Every day | RESPIRATORY_TRACT | 11 refills | Status: AC

## 2018-02-01 MED ORDER — FLUTICASONE-SALMETEROL 250-50 MCG/DOSE IN AEPB: 1.0000 | INHALATION_SPRAY | Freq: Two times a day (BID) | RESPIRATORY_TRACT | 11 refills | Status: DC

## 2018-02-01 NOTE — Telephone Encounter (Signed)
Need to verify the meds needed since this was not asked during call intake  LMTCB

## 2018-02-01 NOTE — Telephone Encounter (Signed)
Hey Dr [place] Bells  The [facility] can not cover his advair and they are asking if this can be changed to Olodaterol and Mometasone instead Please advise, thanks!

## 2018-02-01 NOTE — Telephone Encounter (Signed)
rx signed and faxed

## 2018-02-01 NOTE — Telephone Encounter (Signed)
Spoke with Lawerance Bach at the [facility]  She is needing [place] to refax the spiriva and advair rxs to them at [phone]  Will forward to CDY since he is doc of the day and BQ not here  Rx placed on his cart to sign  Please return to triage when done or let us know when done thanks

## 2018-02-02 NOTE — Telephone Encounter (Signed)
This should be okay. 

## 2018-02-02 NOTE — Telephone Encounter (Signed)
Paul Cross is calling back for an update and is requesting a nurse to call her. CB is [phone].

## 2018-02-02 NOTE — Telephone Encounter (Signed)
Spoke with [place] with the [place]. She is aware of VS's response. Nothing further was needed.

## 2018-02-02 NOTE — Telephone Encounter (Signed)
Called and spoke with Lawerance Bach with [place] in [employer] at phone [phone] She is calling to let BQ aware [facility] will not cover pt's advair.  [facility] would like to know if they can change the order to Olodaterol and Mometasone instead of Advair?  Since BQ is out of the office, sending to DOD which is VS today.  VS please advise.

## 2018-02-02 NOTE — Telephone Encounter (Signed)
ATC Linda at [phone], unable to reach anyone.  Called number from 02/01/18, phone note, [phone] ext [phone].  Left message to return call.

## 2018-02-02 NOTE — Telephone Encounter (Signed)
Barnetta Chapel from the [place] is calling back [phone]

## 2018-02-06 ENCOUNTER — Ambulatory Visit: Payer: Medicare Other | Attending: Pulmonary Disease

## 2018-02-06 DIAGNOSIS — R0602 Shortness of breath: Secondary | ICD-10-CM

## 2018-02-06 DIAGNOSIS — J849 Interstitial pulmonary disease, unspecified: Secondary | ICD-10-CM

## 2018-02-06 MED ORDER — ALBUTEROL SULFATE (2.5 MG/3ML) 0.083% IN NEBU
2.5000 mg | INHALATION_SOLUTION | Freq: Once | RESPIRATORY_TRACT | Status: AC
  Filled 2018-02-06: qty 3

## 2018-02-08 ENCOUNTER — Ambulatory Visit (INDEPENDENT_AMBULATORY_CARE_PROVIDER_SITE_OTHER): Payer: Medicare Other | Admitting: Family Medicine

## 2018-02-08 ENCOUNTER — Encounter: Payer: Self-pay | Admitting: Family Medicine

## 2018-02-08 VITALS — BP 108/58 | HR 69 | Temp 97.4°F | Ht 72.0 in | Wt 188.6 lb

## 2018-02-08 DIAGNOSIS — J8410 Pulmonary fibrosis, unspecified: Secondary | ICD-10-CM | POA: Insufficient documentation

## 2018-02-08 DIAGNOSIS — I714 Abdominal aortic aneurysm, without rupture, unspecified: Secondary | ICD-10-CM

## 2018-02-08 DIAGNOSIS — F329 Major depressive disorder, single episode, unspecified: Secondary | ICD-10-CM

## 2018-02-08 DIAGNOSIS — F32A Depression, unspecified: Secondary | ICD-10-CM | POA: Insufficient documentation

## 2018-02-08 DIAGNOSIS — R7309 Other abnormal glucose: Secondary | ICD-10-CM | POA: Insufficient documentation

## 2018-02-08 DIAGNOSIS — D3501 Benign neoplasm of right adrenal gland: Secondary | ICD-10-CM

## 2018-02-08 DIAGNOSIS — B029 Zoster without complications: Secondary | ICD-10-CM | POA: Insufficient documentation

## 2018-02-08 DIAGNOSIS — E039 Hypothyroidism, unspecified: Secondary | ICD-10-CM | POA: Insufficient documentation

## 2018-02-08 DIAGNOSIS — F419 Anxiety disorder, unspecified: Secondary | ICD-10-CM | POA: Insufficient documentation

## 2018-02-08 DIAGNOSIS — Z8619 Personal history of other infectious and parasitic diseases: Secondary | ICD-10-CM

## 2018-02-08 DIAGNOSIS — R809 Proteinuria, unspecified: Secondary | ICD-10-CM | POA: Diagnosis not present

## 2018-02-08 DIAGNOSIS — I10 Essential (primary) hypertension: Secondary | ICD-10-CM | POA: Insufficient documentation

## 2018-02-08 LAB — POCT URINALYSIS DIPSTICK
BILIRUBIN UA: NEGATIVE
Blood, UA: NEGATIVE
Glucose, UA: NEGATIVE
KETONES UA: NEGATIVE
Leukocytes, UA: NEGATIVE
NITRITE UA: NEGATIVE
PH UA: 6 (ref 5.0–8.0)
PROTEIN UA: POSITIVE — AB
Spec Grav, UA: 1.02 (ref 1.010–1.025)
UROBILINOGEN UA: 0.2 U/dL

## 2018-02-08 LAB — BASIC METABOLIC PANEL
BUN: 27 mg/dL — ABNORMAL HIGH (ref 6–23)
CHLORIDE: 101 meq/L (ref 96–112)
CO2: 31 meq/L (ref 19–32)
Calcium: 10.2 mg/dL (ref 8.4–10.5)
Creatinine, Ser: 1.2 mg/dL (ref 0.40–1.50)
GFR: 59.42 mL/min — ABNORMAL LOW (ref >60.00)
GLUCOSE: 103 mg/dL — AB (ref 70–99)
Potassium: 4.7 mEq/L (ref 3.5–5.1)
SODIUM: 138 meq/L (ref 135–145)

## 2018-02-08 LAB — URINALYSIS, MICROSCOPIC ONLY: RBC / HPF: NONE SEEN (ref 0–?)

## 2018-02-08 LAB — HEMOGLOBIN A1C: Hgb A1c MFr Bld: 5.9 % (ref 4.6–6.5)

## 2018-02-08 NOTE — Assessment & Plan Note (Addendum)
Needs repeat urine collection.  If still present he will need to see nephrology.

## 2018-02-08 NOTE — Assessment & Plan Note (Signed)
Check A1c. 

## 2018-02-08 NOTE — Assessment & Plan Note (Signed)
Somewhat improved since discharge from the hospital.  Discussed the potential for medication though him, his daughter, and myself both agree that it would likely not be worth the possible side effects.  He will continue to see the psychologist from the [place].

## 2018-02-08 NOTE — Assessment & Plan Note (Signed)
Symptoms have completely resolved.  He will monitor.

## 2018-02-08 NOTE — Assessment & Plan Note (Signed)
Well-controlled.  Continue current regimen. 

## 2018-02-08 NOTE — Assessment & Plan Note (Signed)
He will continue his current regimen through pulmonology.

## 2018-02-08 NOTE — Patient Instructions (Signed)
Nice to meet you. We will obtain some follow-up lab work in the urine study today. Please monitor your anxiety depression and if it worsens let us know.  If you develop thoughts of harming yourself please go to the emergency room.  Please see the psychologist from the [place] as planned. Please continue to see pulmonology and continue with her treatment.

## 2018-02-08 NOTE — Assessment & Plan Note (Signed)
Noted after the patient left the office.  I will have the CMA contact the patient and his daughter to advise that he would need lab work evaluation including 24-hour urine collection to evaluate this further.

## 2018-02-08 NOTE — Assessment & Plan Note (Signed)
Continue Synthroid °
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
## 2018-02-08 NOTE — Assessment & Plan Note (Addendum)
Did not discuss this with the patient while he was here.  I will have the [place] contact the patient or his daughter to let them know that this was present.  He also had a right common iliac artery aneurysm as well.  We will offer them referral to vascular surgery for monitoring.

## 2018-02-09 ENCOUNTER — Other Ambulatory Visit: Payer: Self-pay | Admitting: Family Medicine

## 2018-02-09 DIAGNOSIS — R801 Persistent proteinuria, unspecified: Secondary | ICD-10-CM

## 2018-02-09 MED ORDER — DEXAMETHASONE 1 MG PO TABS: 1.0000 mg | ORAL_TABLET | Freq: Once | ORAL | 0 refills | Status: AC

## 2018-02-09 NOTE — Progress Notes (Signed)
Noted an understandable. Labs ordered. Dexamethasone sent to pharmacy.

## 2018-02-09 NOTE — Addendum Note (Signed)
Addended by: Leone Haven on: 02/09/2018 01:12 PM   Modules accepted: Orders

## 2018-02-09 NOTE — Progress Notes (Signed)
Patients daughter notified, she states the patient is a little overwhelmed with all the referrals and information. She would like to have the nephrologist referral and the adenoma work up and hold off on the rest. I have scheduled the patient for labs next week at 8 AM. notified her to have the patient take the dexamethasone there night before at 11pm. She verbalized understanding and would like [facility].

## 2018-02-13 ENCOUNTER — Encounter: Payer: Self-pay | Admitting: *Deleted

## 2018-02-13 ENCOUNTER — Encounter: Payer: Medicare Other | Attending: Pulmonary Disease | Admitting: *Deleted

## 2018-02-13 VITALS — Ht 72.0 in | Wt 189.1 lb

## 2018-02-13 DIAGNOSIS — Z7982 Long term (current) use of aspirin: Secondary | ICD-10-CM | POA: Insufficient documentation

## 2018-02-13 DIAGNOSIS — Z79890 Hormone replacement therapy (postmenopausal): Secondary | ICD-10-CM | POA: Insufficient documentation

## 2018-02-13 DIAGNOSIS — E039 Hypothyroidism, unspecified: Secondary | ICD-10-CM | POA: Insufficient documentation

## 2018-02-13 DIAGNOSIS — E785 Hyperlipidemia, unspecified: Secondary | ICD-10-CM | POA: Insufficient documentation

## 2018-02-13 DIAGNOSIS — J849 Interstitial pulmonary disease, unspecified: Secondary | ICD-10-CM | POA: Insufficient documentation

## 2018-02-13 DIAGNOSIS — I10 Essential (primary) hypertension: Secondary | ICD-10-CM | POA: Insufficient documentation

## 2018-02-13 DIAGNOSIS — Z79899 Other long term (current) drug therapy: Secondary | ICD-10-CM | POA: Insufficient documentation

## 2018-02-13 DIAGNOSIS — Z87891 Personal history of nicotine dependence: Secondary | ICD-10-CM | POA: Insufficient documentation

## 2018-02-13 NOTE — Progress Notes (Signed)
Incomplete Session Note  Patient Details  Name: Paul Cross MRN: [id number] Date of Birth: 1917/10/31 Referring Provider:     Pulmonary Rehab from 02/13/2018 in [facility]  Referring Provider  [name] did not complete his rehab session.  He was found to still be receiving [name].  He will start program after Home Health visits are completed.

## 2018-02-13 NOTE — Patient Instructions (Signed)
Patient Instructions  Patient Details  Name: Paul Cross MRN: [id number] Date of Birth: 02-Apr-1918 Referring Provider:  Juanito Doom, MD  Below are your personal goals for exercise, nutrition, and risk factors. Our goal is to help you stay on track towards obtaining and maintaining these goals. We will be discussing your progress on these goals with you throughout the program.  Initial Exercise Prescription: Initial Exercise Prescription - 02/13/18 1100      Date of Initial Exercise RX and Referring Provider   Date  02/13/18    Referring Provider  [place]      Oxygen   Oxygen  Continuous    Liters  2      Treadmill   MPH  1    Grade  0    Minutes  15    METs  1.77      NuStep   Level  2    SPM  80    Minutes  15    METs  1.5      Arm Ergometer   Level  1    Minutes  15    METs  1.5      Track   Minutes  15      Prescription Details   Frequency (times per week)  3    Duration  Progress to 45 minutes of aerobic exercise without signs/symptoms of physical distress      Intensity   THRR 40-80% of Max Heartrate  95-113    Ratings of Perceived Exertion  11-13    Perceived Dyspnea  0-4      Resistance Training   Training Prescription  Yes    Weight  2 lb    Reps  10-15       Exercise Goals: Frequency: Be able to perform aerobic exercise two to three times per week in program working toward 2-5 days per week of home exercise.  Intensity: Work with a perceived exertion of 11 (fairly light) - 15 (hard) while following your exercise prescription.  We will make changes to your prescription with you as you progress through the program.   Duration: Be able to do 30 to 45 minutes of continuous aerobic exercise in addition to a 5 minute warm-up and a 5 minute cool-down routine.   Nutrition Goals: Your personal nutrition goals will be established when you do your nutrition analysis with the dietician.  The following are general nutrition guidelines to  follow: Cholesterol < 200mg /day Sodium < 1500mg /day Fiber: Men over 50 yrs - 30 grams per day  Personal Goals: Personal Goals and Risk Factors at Admission - 02/13/18 1301      Core Components/Risk Factors/Patient Goals on Admission   Improve shortness of breath with ADL's  Yes    Intervention  Provide education, individualized exercise plan and daily activity instruction to help decrease symptoms of SOB with activities of daily living.    Expected Outcomes  Short Term: Improve cardiorespiratory fitness to achieve a reduction of symptoms when performing ADLs;Long Term: Be able to perform more ADLs without symptoms or delay the onset of symptoms    Hypertension  Yes    Intervention  Provide education on lifestyle modifcations including regular physical activity/exercise, weight management, moderate sodium restriction and increased consumption of fresh fruit, vegetables, and low fat dairy, alcohol moderation, and smoking cessation.;Monitor prescription use compliance.    Expected Outcomes  Short Term: Continued assessment and intervention until BP is < 140/70mm HG in hypertensive participants. < 130/71mm  HG in hypertensive participants with diabetes, heart failure or chronic kidney disease.;Long Term: Maintenance of blood pressure at goal levels.    Lipids  Yes    Intervention  Provide education and support for participant on nutrition & aerobic/resistive exercise along with prescribed medications to achieve LDL 70mg , HDL >40mg .    Expected Outcomes  Short Term: Participant states understanding of desired cholesterol values and is compliant with medications prescribed. Participant is following exercise prescription and nutrition guidelines.;Long Term: Cholesterol controlled with medications as prescribed, with individualized exercise RX and with personalized nutrition plan. Value goals: LDL < 70mg , HDL > 40 mg.       Tobacco Use Initial Evaluation: Social History   Tobacco Use  Smoking Status  Former Smoker  Smokeless Tobacco Former Systems developer    Exercise Goals and Review: Exercise Goals    Wells Name 02/13/18 1139             Exercise Goals   Increase Physical Activity  Yes       Intervention  Provide advice, education, support and counseling about physical activity/exercise needs.;Develop an individualized exercise prescription for aerobic and resistive training based on initial evaluation findings, risk stratification, comorbidities and participant's personal goals.       Expected Outcomes  Short Term: Attend rehab on a regular basis to increase amount of physical activity.;Long Term: Add in home exercise to make exercise part of routine and to increase amount of physical activity.;Long Term: Exercising regularly at least 3-5 days a week.       Increase Strength and Stamina  Yes       Intervention  Provide advice, education, support and counseling about physical activity/exercise needs.;Develop an individualized exercise prescription for aerobic and resistive training based on initial evaluation findings, risk stratification, comorbidities and participant's personal goals.       Expected Outcomes  Short Term: Increase workloads from initial exercise prescription for resistance, speed, and METs.;Short Term: Perform resistance training exercises routinely during rehab and add in resistance training at home;Long Term: Improve cardiorespiratory fitness, muscular endurance and strength as measured by increased METs and functional capacity (6MWT)       Able to understand and use rate of perceived exertion (RPE) scale  Yes       Intervention  Provide education and explanation on how to use RPE scale       Expected Outcomes  Short Term: Able to use RPE daily in rehab to express subjective intensity level;Long Term:  Able to use RPE to guide intensity level when exercising independently       Able to understand and use Dyspnea scale  Yes       Intervention  Provide education and explanation on  how to use Dyspnea scale       Expected Outcomes  Short Term: Able to use Dyspnea scale daily in rehab to express subjective sense of shortness of breath during exertion;Long Term: Able to use Dyspnea scale to guide intensity level when exercising independently       Knowledge and understanding of Target Heart Rate Range (THRR)  Yes       Intervention  Provide education and explanation of THRR including how the numbers were predicted and where they are located for reference       Expected Outcomes  Short Term: Able to state/look up THRR;Short Term: Able to use daily as guideline for intensity in rehab;Long Term: Able to use THRR to govern intensity when exercising independently  Able to check pulse independently  Yes       Intervention  Provide education and demonstration on how to check pulse in carotid and radial arteries.;Review the importance of being able to check your own pulse for safety during independent exercise       Expected Outcomes  Short Term: Able to explain why pulse checking is important during independent exercise;Long Term: Able to check pulse independently and accurately       Understanding of Exercise Prescription  Yes       Intervention  Provide education, explanation, and written materials on patient's individual exercise prescription       Expected Outcomes  Short Term: Able to explain program exercise prescription;Long Term: Able to explain home exercise prescription to exercise independently          Copy of goals given to participant.

## 2018-02-13 NOTE — Progress Notes (Signed)
Pulmonary Individual Treatment Plan  Patient Details  Name: Paul Cross MRN: [id number] Date of Birth: 02-17-1918 Referring Provider:     Pulmonary Rehab from 02/13/2018 in [facility] Pulmonary Rehab  Referring Provider  McQuaid      Initial Encounter Date:    Pulmonary Rehab from 02/13/2018 in [facility] Cardiac and Pulmonary Rehab  Date  02/13/18      Visit Diagnosis: ILD (interstitial lung disease) ([place])  Patient's Home Medications on Admission:  Current Outpatient Medications:  .  acetaminophen (TYLENOL) 325 MG tablet, Take 975 mg by mouth 3 (three) times daily as needed for mild pain, moderate pain, fever or headache., Disp: , Rfl:  .  amLODipine (NORVASC) 5 MG tablet, Take 5 mg by mouth daily., Disp: , Rfl:  .  aspirin EC 81 MG tablet, Take 81 mg by mouth daily., Disp: , Rfl:  .  atorvastatin (LIPITOR) 40 MG tablet, Take 20 mg by mouth at bedtime., Disp: , Rfl:  .  cholecalciferol (VITAMIN D) 1000 units tablet, Take 1,000 Units by mouth daily., Disp: , Rfl:  .  hydrocortisone cream 1 %, Apply 1 application topically 2 (two) times daily. FOR TAILBONE, Disp: , Rfl:  .  ipratropium-albuterol (DUONEB) 0.5-2.5 (3) MG/3ML SOLN, Take 3 mLs by nebulization 3 (three) times daily., Disp: 360 mL, Rfl: 3 .  lactulose (CEPHULAC) 10 g packet, Take 1 packet (10 g total) by mouth at bedtime., Disp: 30 each, Rfl: 0 .  levothyroxine (SYNTHROID, LEVOTHROID) 137 MCG tablet, Take 137 mcg by mouth daily before breakfast. , Disp: , Rfl:  .  lidocaine (LIDODERM) 5 %, Place 1 patch onto the skin daily. Remove & Discard patch within 12 hours or as directed by MD, Disp: 7 patch, Rfl: 0 .  Multiple Vitamin (MULTIVITAMIN) capsule, Take 1 capsule by mouth daily., Disp: , Rfl:  .  tamsulosin (FLOMAX) 0.4 MG CAPS capsule, Take 0.4 mg by mouth daily., Disp: , Rfl:  .  tiotropium (SPIRIVA HANDIHALER) 18 MCG inhalation capsule, Place 1 capsule (18 mcg total) into inhaler and inhale daily., Disp: 30  capsule, Rfl: 11 .  umeclidinium-vilanterol (ANORO ELLIPTA) 62.5-25 MCG/INH AEPB, Inhale 1 puff into the lungs daily., Disp: 1 each, Rfl: 0 .  vitamin B-12 (CYANOCOBALAMIN) 1000 MCG tablet, Take 1,000 mcg by mouth daily., Disp: , Rfl:  No current facility-administered medications for this visit.   Facility-Administered Medications Ordered in Other Visits:  .  albuterol (PROVENTIL) (2.5 MG/3ML) 0.083% nebulizer solution 2.5 mg, 2.5 mg, Nebulization, Once, Juanito Doom, MD  Past Medical History: Past Medical History:  Diagnosis Date  . Hyperlipidemia   . Hypertension   . 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 Latest Ref Rng & Units 01/01/2018 02/08/2018   Hemoglobin A1c 4.6 - 6.5 % - 5.9   PHART 7.350 - 7.450 7.45 -   PCO2ART 32.0 - 48.0 mmHg 36 -   HCO3 20.0 - 28.0 mmol/L 25.0 -   O2SAT % 89.7 -       Pulmonary Assessment Scores: Pulmonary Assessment Scores    Row Name 02/13/18 1256         ADL UCSD   ADL Phase  Entry     SOB Score total  79     Rest  3  Walk  5     Stairs  4     Bath  3     Dress  3     Shop  4       CAT Score   CAT Score  18        Pulmonary Function Assessment: Pulmonary Function Assessment - 02/13/18 1255      Breath   Shortness of Breath  Yes;Limiting activity       Exercise Target Goals: Exercise Program Goal: Individual exercise prescription set using results from initial 6 min walk test and THRR while considering  patient's activity barriers and safety.   Exercise Prescription Goal: Initial exercise prescription builds to 30-45 minutes a day of aerobic activity, 2-3 days per week.  Home exercise guidelines will be given to patient during program as part of exercise prescription that the participant will acknowledge.  Activity  Barriers & Risk Stratification: Activity Barriers & Cardiac Risk Stratification - 02/13/18 1259      Activity Barriers & Cardiac Risk Stratification   Activity Barriers  Assistive Device;Shortness of Breath       6 Minute Walk: 6 Minute Walk    Row Name 02/13/18 1140         6 Minute Walk   Distance  825 feet     Walk Time  6 minutes     # of Rest Breaks  0     MPH  1.56     METS  2.15     RPE  13     Perceived Dyspnea   2     VO2 Peak  2.9     Resting HR  77 bpm     Resting BP  118/60     Resting Oxygen Saturation   96 %     Exercise Oxygen Saturation  during 6 min walk  83 %     Max Ex. HR  103 bpm     Max Ex. BP  138/62     2 Minute Post BP  128/68       Interval HR   1 Minute HR  93     2 Minute HR  94     3 Minute HR  94     4 Minute HR  99     5 Minute HR  98     6 Minute HR  103     2 Minute Post HR  95     Interval Heart Rate?  Yes       Interval Oxygen   Interval Oxygen?  Yes     Baseline Oxygen Saturation %  96 %     1 Minute Oxygen Saturation %  88 %     1 Minute Liters of Oxygen  2 L     2 Minute Oxygen Saturation %  89 %     2 Minute Liters of Oxygen  2 L     3 Minute Oxygen Saturation %  86 %     3 Minute Liters of Oxygen  2 L     4 Minute Oxygen Saturation %  87 %     4 Minute Liters of Oxygen  2 L     5 Minute Oxygen Saturation %  84 %     5 Minute Liters of Oxygen  2 L     6 Minute Oxygen Saturation %  83 %     6 Minute Liters of Oxygen  2 L  2 Minute Post Oxygen Saturation %  94 %     2 Minute Post Liters of Oxygen  2 L       Oxygen Initial Assessment: Oxygen Initial Assessment - 02/13/18 1254      Home Oxygen   Home Oxygen Device  Home Concentrator;E-Tanks    Sleep Oxygen Prescription  Continuous    Liters per minute  2    Home Exercise Oxygen Prescription  Continuous    Liters per minute  2    Home at Rest Exercise Oxygen Prescription  Continuous    Liters per minute  2    Compliance with Home Oxygen Use  Yes      Initial  6 min Walk   Oxygen Used  Continuous    Liters per minute  2      Program Oxygen Prescription   Program Oxygen Prescription  Continuous    Liters per minute  2      Intervention   Short Term Goals  To learn and exhibit compliance with exercise, home and travel O2 prescription;To learn and understand importance of monitoring SPO2 with pulse oximeter and demonstrate accurate use of the pulse oximeter.;To learn and understand importance of maintaining oxygen saturations>88%;To learn and demonstrate proper pursed lip breathing techniques or other breathing techniques.;To learn and demonstrate proper use of respiratory medications    Long  Term Goals  Exhibits compliance with exercise, home and travel O2 prescription;Verbalizes importance of monitoring SPO2 with pulse oximeter and return demonstration;Maintenance of O2 saturations>88%;Exhibits proper breathing techniques, such as pursed lip breathing or other method taught during program session;Compliance with respiratory medication;Demonstrates proper use of MDI's       Oxygen Re-Evaluation:   Oxygen Discharge (Final Oxygen Re-Evaluation):   Initial Exercise Prescription: Initial Exercise Prescription - 02/13/18 1100      Date of Initial Exercise RX and Referring Provider   Date  02/13/18    Referring Provider  McQuaid      Oxygen   Oxygen  Continuous    Liters  2      Treadmill   MPH  1    Grade  0    Minutes  15    METs  1.77      NuStep   Level  2    SPM  80    Minutes  15    METs  1.5      Arm Ergometer   Level  1    Minutes  15    METs  1.5      Track   Minutes  15      Prescription Details   Frequency (times per week)  3    Duration  Progress to 45 minutes of aerobic exercise without signs/symptoms of physical distress      Intensity   THRR 40-80% of Max Heartrate  95-113    Ratings of Perceived Exertion  11-13    Perceived Dyspnea  0-4      Resistance Training   Training Prescription  Yes    Weight   2 lb    Reps  10-15       Perform Capillary Blood Glucose checks as needed.  Exercise Prescription Changes:   Exercise Comments:   Exercise Goals and Review: Exercise Goals    Row Name 02/13/18 1139             Exercise Goals   Increase Physical Activity  Yes       Intervention  Provide  advice, education, support and counseling about physical activity/exercise needs.;Develop an individualized exercise prescription for aerobic and resistive training based on initial evaluation findings, risk stratification, comorbidities and participant's personal goals.       Expected Outcomes  Short Term: Attend rehab on a regular basis to increase amount of physical activity.;Long Term: Add in home exercise to make exercise part of routine and to increase amount of physical activity.;Long Term: Exercising regularly at least 3-5 days a week.       Increase Strength and Stamina  Yes       Intervention  Provide advice, education, support and counseling about physical activity/exercise needs.;Develop an individualized exercise prescription for aerobic and resistive training based on initial evaluation findings, risk stratification, comorbidities and participant's personal goals.       Expected Outcomes  Short Term: Increase workloads from initial exercise prescription for resistance, speed, and METs.;Short Term: Perform resistance training exercises routinely during rehab and add in resistance training at home;Long Term: Improve cardiorespiratory fitness, muscular endurance and strength as measured by increased METs and functional capacity (6MWT)       Able to understand and use rate of perceived exertion (RPE) scale  Yes       Intervention  Provide education and explanation on how to use RPE scale       Expected Outcomes  Short Term: Able to use RPE daily in rehab to express subjective intensity level;Long Term:  Able to use RPE to guide intensity level when exercising independently       Able to  understand and use Dyspnea scale  Yes       Intervention  Provide education and explanation on how to use Dyspnea scale       Expected Outcomes  Short Term: Able to use Dyspnea scale daily in rehab to express subjective sense of shortness of breath during exertion;Long Term: Able to use Dyspnea scale to guide intensity level when exercising independently       Knowledge and understanding of Target Heart Rate Range (THRR)  Yes       Intervention  Provide education and explanation of THRR including how the numbers were predicted and where they are located for reference       Expected Outcomes  Short Term: Able to state/look up THRR;Short Term: Able to use daily as guideline for intensity in rehab;Long Term: Able to use THRR to govern intensity when exercising independently       Able to check pulse independently  Yes       Intervention  Provide education and demonstration on how to check pulse in carotid and radial arteries.;Review the importance of being able to check your own pulse for safety during independent exercise       Expected Outcomes  Short Term: Able to explain why pulse checking is important during independent exercise;Long Term: Able to check pulse independently and accurately       Understanding of Exercise Prescription  Yes       Intervention  Provide education, explanation, and written materials on patient's individual exercise prescription       Expected Outcomes  Short Term: Able to explain program exercise prescription;Long Term: Able to explain home exercise prescription to exercise independently          Exercise Goals Re-Evaluation :   Discharge Exercise Prescription (Final Exercise Prescription Changes):   Nutrition:  Target Goals: Understanding of nutrition guidelines, daily intake of sodium <1573m, cholesterol <2082m calories 30% from fat and 7% or  less from saturated fats, daily to have 5 or more servings of fruits and vegetables.  Biometrics: Pre Biometrics -  02/13/18 1138      Pre Biometrics   Height  6' (1.829 m)    Weight  189 lb 1.6 oz (85.8 kg)    Waist Circumference  37.75 inches    Hip Circumference  42 inches    Waist to Hip Ratio  0.9 %    BMI (Calculated)  25.64        Nutrition Therapy Plan and Nutrition Goals: Nutrition Therapy & Goals - 02/13/18 1300      Intervention Plan   Intervention  Prescribe, educate and counsel regarding individualized specific dietary modifications aiming towards targeted core components such as weight, hypertension, lipid management, diabetes, heart failure and other comorbidities.    Expected Outcomes  Short Term Goal: Understand basic principles of dietary content, such as calories, fat, sodium, cholesterol and nutrients.;Short Term Goal: A plan has been developed with personal nutrition goals set during dietitian appointment.;Long Term Goal: Adherence to prescribed nutrition plan.       Nutrition Assessments:   Nutrition Goals Re-Evaluation:   Nutrition Goals Discharge (Final Nutrition Goals Re-Evaluation):   Psychosocial: Target Goals: Acknowledge presence or absence of significant depression and/or stress, maximize coping skills, provide positive support system. Participant is able to verbalize types and ability to use techniques and skills needed for reducing stress and depression.   Initial Review & Psychosocial Screening: Initial Psych Review & Screening - 02/13/18 1259      Initial Review   Current issues with  None Identified      Family Dynamics   Good Support System?  Yes   Daughter     Barriers   Psychosocial barriers to participate in program  There are no identifiable barriers or psychosocial needs.;The patient should benefit from training in stress management and relaxation.      Screening Interventions   Interventions  Encouraged to exercise    Expected Outcomes  Short Term goal: Utilizing psychosocial counselor, staff and physician to assist with identification of  specific Stressors or current issues interfering with healing process. Setting desired goal for each stressor or current issue identified.;Long Term Goal: Stressors or current issues are controlled or eliminated.;Short Term goal: Identification and review with participant of any Quality of Life or Depression concerns found by scoring the questionnaire.;Long Term goal: The participant improves quality of Life and PHQ9 Scores as seen by post scores and/or verbalization of changes       Quality of Life Scores:  Scores of 19 and below usually indicate a poorer quality of life in these areas.  A difference of  2-3 points is a clinically meaningful difference.  A difference of 2-3 points in the total score of the Quality of Life Index has been associated with significant improvement in overall quality of life, self-image, physical symptoms, and general health in studies assessing change in quality of life.  PHQ-9: Recent Review Flowsheet Data    Depression screen [facility] 2/9 02/08/2018   Decreased Interest 0   Down, Depressed, Hopeless 1   PHQ - 2 Score 1     Interpretation of Total Score  Total Score Depression Severity:  1-4 = Minimal depression, 5-9 = Mild depression, 10-14 = Moderate depression, 15-19 = Moderately severe depression, 20-27 = Severe depression   Psychosocial Evaluation and Intervention:   Psychosocial Re-Evaluation:   Psychosocial Discharge (Final Psychosocial Re-Evaluation):   Education: Education Goals: Education classes will be  provided on a weekly basis, covering required topics. Participant will state understanding/return demonstration of topics presented.  Learning Barriers/Preferences: Learning Barriers/Preferences - 02/13/18 1300      Learning Barriers/Preferences   Learning Barriers  Hearing   Wears hearing aids.  does not always were them   Learning Preferences  None       Education Topics:  Initial Evaluation Education: - Verbal, written and  demonstration of respiratory meds, oximetry and breathing techniques. Instruction on use of nebulizers and MDIs and importance of monitoring MDI activations.   Pulmonary Rehab from 02/13/2018 in [facility] Cardiac and Pulmonary Rehab  Date  02/13/18  Educator  SB  Instruction Review Code  1- Verbalizes Understanding      General Nutrition Guidelines/Fats and Fiber: -Group instruction provided by verbal, written material, models and posters to present the general guidelines for heart healthy nutrition. Gives an explanation and review of dietary fats and fiber.   Controlling Sodium/Reading Food Labels: -Group verbal and written material supporting the discussion of sodium use in heart healthy nutrition. Review and explanation with models, verbal and written materials for utilization of the food label.   Exercise Physiology & General Exercise Guidelines: - Group verbal and written instruction with models to review the exercise physiology of the cardiovascular system and associated critical values. Provides general exercise guidelines with specific guidelines to those with heart or lung disease.    Aerobic Exercise & Resistance Training: - Gives group verbal and written instruction on the various components of exercise. Focuses on aerobic and resistive training programs and the benefits of this training and how to safely progress through these programs.   Flexibility, Balance, Mind/Body Relaxation: Provides group verbal/written instruction on the benefits of flexibility and balance training, including mind/body exercise modes such as yoga, pilates and tai chi.  Demonstration and skill practice provided.   Stress and Anxiety: - Provides group verbal and written instruction about the health risks of elevated stress and causes of high stress.  Discuss the correlation between heart/lung disease and anxiety and treatment options. Review healthy ways to manage with stress and anxiety.   Depression: -  Provides group verbal and written instruction on the correlation between heart/lung disease and depressed mood, treatment options, and the stigmas associated with seeking treatment.   Exercise & Equipment Safety: - Individual verbal instruction and demonstration of equipment use and safety with use of the equipment.   Pulmonary Rehab from 02/13/2018 in [facility] Cardiac and Pulmonary Rehab  Date  02/13/18  Educator  sB  Instruction Review Code  1- Verbalizes Understanding      Infection Prevention: - Provides verbal and written material to individual with discussion of infection control including proper hand washing and proper equipment cleaning during exercise session.   Pulmonary Rehab from 02/13/2018 in [facility] Cardiac and Pulmonary Rehab  Date  02/13/18  Educator  SB1  Instruction Review Code  1- Verbalizes Understanding      Falls Prevention: - Provides verbal and written material to individual with discussion of falls prevention and safety.   Pulmonary Rehab from 02/13/2018 in [facility] Cardiac and Pulmonary Rehab  Date  02/13/18  Educator  SB  Instruction Review Code  1- Verbalizes Understanding      Diabetes: - Individual verbal and written instruction to review signs/symptoms of diabetes, desired ranges of glucose level fasting, after meals and with exercise. Advice that pre and post exercise glucose checks will be done for 3 sessions at entry of program.   Chronic Lung Diseases: -  Group verbal and written instruction to review updates, respiratory medications, advancements in procedures and treatments. Discuss use of supplemental oxygen including available portable oxygen systems, continuous and intermittent flow rates, concentrators, personal use and safety guidelines. Review proper use of inhaler and spacers. Provide informative websites for self-education.    Energy Conservation: - Provide group verbal and written instruction for methods to conserve energy, plan and organize  activities. Instruct on pacing techniques, use of adaptive equipment and posture/positioning to relieve shortness of breath.   Triggers and Exacerbations: - Group verbal and written instruction to review types of environmental triggers and ways to prevent exacerbations. Discuss weather changes, air quality and the benefits of nasal washing. Review warning signs and symptoms to help prevent infections. Discuss techniques for effective airway clearance, coughing, and vibrations.   AED/CPR: - Group verbal and written instruction with the use of models to demonstrate the basic use of the AED with the basic ABC's of resuscitation.   Anatomy and Physiology of the Lungs: - Group verbal and written instruction with the use of models to provide basic lung anatomy and physiology related to function, structure and complications of lung disease.   Anatomy & Physiology of the Heart: - Group verbal and written instruction and models provide basic cardiac anatomy and physiology, with the coronary electrical and arterial systems. Review of Valvular disease and Heart Failure   Cardiac Medications: - Group verbal and written instruction to review commonly prescribed medications for heart disease. Reviews the medication, class of the drug, and side effects.   Know Your Numbers and Risk Factors: -Group verbal and written instruction about important numbers in your health.  Discussion of what are risk factors and how they play a role in the disease process.  Review of Cholesterol, Blood Pressure, Diabetes, and BMI and the role they play in your overall health.   Sleep Hygiene: -Provides group verbal and written instruction about how sleep can affect your health.  Define sleep hygiene, discuss sleep cycles and impact of sleep habits. Review good sleep hygiene tips.    Other: -Provides group and verbal instruction on various topics (see comments)    Knowledge Questionnaire Score: Knowledge  Questionnaire Score - 02/13/18 1301      Knowledge Questionnaire Score   Pre Score  13/18   Correct responses reviewed with Bug today. He verbalized understanding and had no further questions today       Core Components/Risk Factors/Patient Goals at Admission: Personal Goals and Risk Factors at Admission - 02/13/18 1301      Core Components/Risk Factors/Patient Goals on Admission   Improve shortness of breath with ADL's  Yes    Intervention  Provide education, individualized exercise plan and daily activity instruction to help decrease symptoms of SOB with activities of daily living.    Expected Outcomes  Short Term: Improve cardiorespiratory fitness to achieve a reduction of symptoms when performing ADLs;Long Term: Be able to perform more ADLs without symptoms or delay the onset of symptoms    Hypertension  Yes    Intervention  Provide education on lifestyle modifcations including regular physical activity/exercise, weight management, moderate sodium restriction and increased consumption of fresh fruit, vegetables, and low fat dairy, alcohol moderation, and smoking cessation.;Monitor prescription use compliance.    Expected Outcomes  Short Term: Continued assessment and intervention until BP is < 140/24m HG in hypertensive participants. < 130/852mHG in hypertensive participants with diabetes, heart failure or chronic kidney disease.;Long Term: Maintenance of blood pressure at goal levels.  Lipids  Yes    Intervention  Provide education and support for participant on nutrition & aerobic/resistive exercise along with prescribed medications to achieve LDL <14m, HDL >429m    Expected Outcomes  Short Term: Participant states understanding of desired cholesterol values and is compliant with medications prescribed. Participant is following exercise prescription and nutrition guidelines.;Long Term: Cholesterol controlled with medications as prescribed, with individualized exercise RX and with  personalized nutrition plan. Value goals: LDL < 7050mHDL > 40 mg.       Core Components/Risk Factors/Patient Goals Review:    Core Components/Risk Factors/Patient Goals at Discharge (Final Review):    ITP Comments: ITP Comments    Row Name 02/13/18 1247           ITP Comments  Bug arrived, completed his 6 min walk and at that point we found out he was still receiving [facility] not complete the Medical review.  WIll use paperwork to complete ITP evaluation assessments and send to Dr MilSabra Heckr review, changes as needed and signature.   [facility]          Comments: ITP ready for admission on 03/12/2018 after Home Health completed

## 2018-02-15 ENCOUNTER — Other Ambulatory Visit (INDEPENDENT_AMBULATORY_CARE_PROVIDER_SITE_OTHER): Payer: Medicare Other

## 2018-02-15 DIAGNOSIS — D3501 Benign neoplasm of right adrenal gland: Secondary | ICD-10-CM | POA: Diagnosis not present

## 2018-02-15 LAB — CORTISOL: Cortisol, Plasma: 1.7 ug/dL

## 2018-02-16 DIAGNOSIS — J849 Interstitial pulmonary disease, unspecified: Secondary | ICD-10-CM

## 2018-02-16 DIAGNOSIS — Z79890 Hormone replacement therapy (postmenopausal): Secondary | ICD-10-CM | POA: Diagnosis not present

## 2018-02-16 DIAGNOSIS — E039 Hypothyroidism, unspecified: Secondary | ICD-10-CM | POA: Diagnosis not present

## 2018-02-16 DIAGNOSIS — Z79899 Other long term (current) drug therapy: Secondary | ICD-10-CM | POA: Diagnosis not present

## 2018-02-16 DIAGNOSIS — Z7982 Long term (current) use of aspirin: Secondary | ICD-10-CM | POA: Diagnosis not present

## 2018-02-16 DIAGNOSIS — E785 Hyperlipidemia, unspecified: Secondary | ICD-10-CM | POA: Diagnosis not present

## 2018-02-16 DIAGNOSIS — Z87891 Personal history of nicotine dependence: Secondary | ICD-10-CM | POA: Diagnosis not present

## 2018-02-16 DIAGNOSIS — I10 Essential (primary) hypertension: Secondary | ICD-10-CM | POA: Diagnosis not present

## 2018-02-16 NOTE — Progress Notes (Signed)
Daily Session Note  Patient Details  Name: Paul Cross MRN: [id number] Date of Birth: Dec 30, 1917 Referring Provider:     Pulmonary Rehab from 02/13/2018 in [facility] Cardiac and Pulmonary Rehab  Referring Provider  McQuaid      Encounter Date: 02/16/2018  Check In: Session Check In - 02/16/18 1016      Check-In   Supervising physician immediately available to respond to emergencies  [facility] immediately available ER MD    Physician(s)  Dr. Alfred Levins and Jimmye Norman    Location  [facility]-Cardiac & Pulmonary Rehab    Staff Present  Justin Mend RCP,RRT,BSRT;Amanda Oletta Darter, [place], ACSM CEP, Exercise Physiologist;Mary Kellie Shropshire, RN, BSN, MA    Medication changes reported      No    Fall or balance concerns reported     No    Warm-up and Cool-down  Performed as group-led instruction    Resistance Training Performed  Yes    VAD Patient?  No      Pain Assessment   Currently in Pain?  No/denies          Social History   Tobacco Use  Smoking Status Former Smoker  Smokeless Tobacco Former Systems developer    Goals Met:  Exercise tolerated well Personal goals reviewed Queuing for purse lip breathing No report of cardiac concerns or symptoms Strength training completed today  Goals Unmet:  Not Applicable  Comments: First full day of exercise!  Patient was oriented to gym and equipment including functions, settings, policies, and procedures.  Patient's individual exercise prescription and treatment plan were reviewed.  All starting workloads were established based on the results of the 6 minute walk test done at initial orientation visit.  The plan for exercise progression was also introduced and progression will be customized based on patient's performance and goals.   Dr. Emily Filbert is Medical Director for [place] and [facility].

## 2018-02-20 LAB — ALDOSTERONE + RENIN ACTIVITY W/ RATIO
ALDO / PRA RATIO: 2.1 ratio (ref 0.9–28.9)
ALDOSTERONE: 6 ng/dL
RENIN ACTIVITY: 2.85 ng/mL/h (ref 0.25–5.82)

## 2018-02-21 ENCOUNTER — Encounter: Payer: Medicare Other | Attending: Pulmonary Disease

## 2018-02-21 DIAGNOSIS — Z87891 Personal history of nicotine dependence: Secondary | ICD-10-CM | POA: Diagnosis not present

## 2018-02-21 DIAGNOSIS — Z79890 Hormone replacement therapy (postmenopausal): Secondary | ICD-10-CM | POA: Diagnosis not present

## 2018-02-21 DIAGNOSIS — E785 Hyperlipidemia, unspecified: Secondary | ICD-10-CM | POA: Diagnosis not present

## 2018-02-21 DIAGNOSIS — J849 Interstitial pulmonary disease, unspecified: Secondary | ICD-10-CM | POA: Insufficient documentation

## 2018-02-21 DIAGNOSIS — I10 Essential (primary) hypertension: Secondary | ICD-10-CM | POA: Insufficient documentation

## 2018-02-21 DIAGNOSIS — Z7982 Long term (current) use of aspirin: Secondary | ICD-10-CM | POA: Insufficient documentation

## 2018-02-21 DIAGNOSIS — E039 Hypothyroidism, unspecified: Secondary | ICD-10-CM | POA: Insufficient documentation

## 2018-02-21 DIAGNOSIS — Z79899 Other long term (current) drug therapy: Secondary | ICD-10-CM | POA: Insufficient documentation

## 2018-02-21 NOTE — Progress Notes (Signed)
Daily Session Note  Patient Details  Name: Paul Cross MRN: [id number] Date of Birth: August 15, 1917 Referring Provider:     Pulmonary Rehab from 02/13/2018 in [facility] Cardiac and Pulmonary Rehab  Referring Provider  McQuaid      Encounter Date: 02/21/2018  Check In: Session Check In - 02/21/18 1120      Check-In   Supervising physician immediately available to respond to emergencies  [facility] immediately available ER MD    Physician(s)   Dr. Alfred Levins and Jimmye Norman    Location  [facility]Cardiac [facility] Pulmonary Rehab    Staff Present  Alberteen Sam, MA, RCEP, CCRP, Exercise Physiologist;Joseph [facility], [place], ACSM CEP, Exercise Physiologist    Medication changes reported      No    Fall or balance concerns reported     No    Warm-up and Cool-down  Performed as group-led instruction    Resistance Training Performed  Yes    VAD Patient?  No    PAD/SET Patient?  No      Pain Assessment   Currently in Pain?  No/denies          Social History   Tobacco Use  Smoking Status Former Smoker  Smokeless Tobacco Former Environmental health practitioner Met:  Proper associated with RPD/PD & O2 Sat Exercise tolerated well Queuing for purse lip breathing No report of cardiac concerns or symptoms Strength training completed today  Goals Unmet:  Not Applicable  Comments: Pt able to follow exercise prescription today without complaint.  Will continue to monitor for progression.    Dr. Emily Filbert is Medical Director for [place] and [facility].

## 2018-02-22 ENCOUNTER — Encounter: Payer: Self-pay | Admitting: Family Medicine

## 2018-02-23 ENCOUNTER — Encounter: Payer: Medicare Other | Admitting: *Deleted

## 2018-02-23 DIAGNOSIS — J849 Interstitial pulmonary disease, unspecified: Secondary | ICD-10-CM | POA: Diagnosis not present

## 2018-02-23 NOTE — Progress Notes (Signed)
Daily Session Note  Patient Details  Name: Paul Cross MRN: [id number] Date of Birth: [birth year] Referring Provider:     Pulmonary Rehab from 02/13/2018 in [facility] Cardiac and Pulmonary Rehab  Referring Provider  McQuaid      Encounter Date: 02/23/2018  Check In: Session Check In - 02/23/18 1013      Check-In   Supervising physician immediately available to respond to emergencies  [facility] immediately available ER MD    Physician(s)  Dr. Corky Downs and [facility]    Location  [facility]Cardiac & Pulmonary Rehab    Staff Present  Renita Papa, RN BSN;Jessica Luan Pulling, MA, RCEP, CCRP, Exercise Physiologist;Joseph Tessie Fass RCP,RRT,BSRT    Medication changes reported      No    Fall or balance concerns reported     No    Warm-up and Cool-down  Performed as group-led instruction    Resistance Training Performed  Yes    VAD Patient?  No    PAD/SET Patient?  No      Pain Assessment   Currently in Pain?  No/denies          Social History   Tobacco Use  Smoking Status Former Smoker  Smokeless Tobacco Former Environmental health practitioner Met:  Proper associated with RPD/PD & O2 Sat Independence with exercise equipment Using PLB without cueing & demonstrates good technique Exercise tolerated well No report of cardiac concerns or symptoms Strength training completed today  Goals Unmet:  Not Applicable  Comments: Pt able to follow exercise prescription today without complaint.  Will continue to monitor for progression.    Dr. Emily Filbert is Medical Director for [place] and [facility].

## 2018-02-26 ENCOUNTER — Encounter: Payer: Medicare Other | Admitting: *Deleted

## 2018-02-26 DIAGNOSIS — J849 Interstitial pulmonary disease, unspecified: Secondary | ICD-10-CM | POA: Diagnosis not present

## 2018-02-26 NOTE — Progress Notes (Signed)
Daily Session Note  Patient Details  Name: Paul Cross MRN: [id number] Date of Birth: Jan 13, 1918 Referring Provider:     Pulmonary Rehab from 02/13/2018 in [facility] Cardiac and Pulmonary Rehab  Referring Provider  McQuaid      Encounter Date: 02/26/2018  Check In: Session Check In - 02/26/18 1049      Check-In   Supervising physician immediately available to respond to emergencies  [facility] immediately available ER MD    Physician(s)  Dr. Kerman Passey and Dr. Joni Fears    Staff Present  Earlean Shawl, BS, ACSM CEP, Exercise Physiologist;Amanda Oletta Darter, BA, ACSM CEP, Exercise Physiologist;Joseph Tessie Fass RCP,RRT,BSRT    Medication changes reported      No    Fall or balance concerns reported     No    Tobacco Cessation  No Change    Warm-up and Cool-down  Performed as group-led instruction    Resistance Training Performed  Yes    VAD Patient?  No    PAD/SET Patient?  No      Pain Assessment   Currently in Pain?  No/denies    Multiple Pain Sites  No          Social History   Tobacco Use  Smoking Status Former Smoker  Smokeless Tobacco Former Environmental health practitioner Met:  Proper associated with RPD/PD & O2 Sat Independence with exercise equipment Exercise tolerated well No report of cardiac concerns or symptoms Strength training completed today  Goals Unmet:  Not Applicable  Comments: Pt able to follow exercise prescription today without complaint.  Will continue to monitor for progression.    Dr. Emily Filbert is Medical Director for [place] and [facility].

## 2018-02-28 ENCOUNTER — Encounter: Payer: Medicare Other | Admitting: *Deleted

## 2018-02-28 DIAGNOSIS — J849 Interstitial pulmonary disease, unspecified: Secondary | ICD-10-CM | POA: Diagnosis not present

## 2018-02-28 NOTE — Progress Notes (Signed)
Daily Session Note  Patient Details  Name: GEORDAN XU MRN: [id number] Date of Birth: [birth year] Referring Provider:     Pulmonary Rehab from 02/13/2018 in [facility] Cardiac and Pulmonary Rehab  Referring Provider  McQuaid      Encounter Date: 02/28/2018  Check In: Session Check In - 02/28/18 1129      Check-In   Supervising physician immediately available to respond to emergencies  [facility] immediately available ER MD    Physician(s)  Drs. Alyse Low    Location  [facility]-Cardiac & Pulmonary Rehab    Staff Present  Heath Lark, RN, BSN, CCRP;Jessica [place], MA, RCEP, CCRP, Exercise Physiologist;Joseph Tessie Fass RCP,RRT,BSRT    Medication changes reported      No    Fall or balance concerns reported     No    Warm-up and Cool-down  Performed as group-led instruction    Resistance Training Performed  Yes    VAD Patient?  No    PAD/SET Patient?  No      Pain Assessment   Currently in Pain?  No/denies          Social History   Tobacco Use  Smoking Status Former Smoker  Smokeless Tobacco Former Environmental health practitioner Met:  Proper associated with RPD/PD & O2 Sat Exercise tolerated well Personal goals reviewed Queuing for purse lip breathing No report of cardiac concerns or symptoms Strength training completed today  Goals Unmet:  Not Applicable  Comments: Pt able to follow exercise prescription today without complaint.  Will continue to monitor for progression.    Dr. Emily Filbert is Medical Director for [place] and [facility].

## 2018-03-02 DIAGNOSIS — J849 Interstitial pulmonary disease, unspecified: Secondary | ICD-10-CM | POA: Diagnosis not present

## 2018-03-02 NOTE — Progress Notes (Signed)
Daily Session Note  Patient Details  Name: Paul Cross MRN: [id number] Date of Birth: [birth year] Referring Provider:     Pulmonary Rehab from 02/13/2018 in [facility] Cardiac and Pulmonary Rehab  Referring Provider  McQuaid      Encounter Date: 03/02/2018  Check In: Session Check In - 03/02/18 1016      Check-In   Supervising physician immediately available to respond to emergencies  [facility] immediately available ER MD    Physician(s)  Dr. Kerman Passey and [facility]    Location  [facility]-Cardiac & Pulmonary Rehab    Staff Present  Renita Papa, RN BSN;Joseph [place] RCP,RRT,BSRT;Amanda Oletta Darter, [place], ACSM CEP, Exercise Physiologist    Medication changes reported      No    Fall or balance concerns reported     No    Warm-up and Cool-down  Performed as group-led instruction    Resistance Training Performed  Yes    VAD Patient?  No    PAD/SET Patient?  No      Pain Assessment   Currently in Pain?  No/denies          Social History   Tobacco Use  Smoking Status Former Smoker  Smokeless Tobacco Former Environmental health practitioner Met:  Proper associated with RPD/PD & O2 Sat Independence with exercise equipment Using PLB without cueing & demonstrates good technique Exercise tolerated well No report of cardiac concerns or symptoms Strength training completed today  Goals Unmet:  Not Applicable  Comments: Reviewed home exercise with pt today.  Pt plans to walk and consider [place] for exercise.  Reviewed THR, pulse, RPE, sign and symptoms, NTG use, and when to call 911 or MD.  Also discussed weather considerations and indoor options.  Pt voiced understanding.    Dr. Emily Filbert is Medical Director for [place] and [facility].

## 2018-03-05 ENCOUNTER — Encounter: Payer: Medicare Other | Admitting: *Deleted

## 2018-03-05 DIAGNOSIS — J849 Interstitial pulmonary disease, unspecified: Secondary | ICD-10-CM

## 2018-03-05 NOTE — Progress Notes (Signed)
Daily Session Note  Patient Details  Name: ANTHONE PRIEUR MRN: [id number] Date of Birth: Sep 05, 1917 Referring Provider:     Pulmonary Rehab from 02/13/2018 in [facility] Cardiac and Pulmonary Rehab  Referring Provider  McQuaid      Encounter Date: 03/05/2018  Check In: Session Check In - 03/05/18 1016      Check-In   Supervising physician immediately available to respond to emergencies  [facility] immediately available ER MD    Physician(s)  Dr. Mariea Clonts and Dr. Corky Downs    Location  [facility]-Cardiac & Pulmonary Rehab    Staff Present  Nada Maclachlan, BA, ACSM CEP, Exercise Physiologist;Joseph [facility] [place], [place], ACSM CEP, Exercise Physiologist    Medication changes reported      No    Fall or balance concerns reported     No    Tobacco Cessation  No Change    Warm-up and Cool-down  Performed as group-led instruction    Resistance Training Performed  Yes    VAD Patient?  No    PAD/SET Patient?  No      Pain Assessment   Currently in Pain?  No/denies    Multiple Pain Sites  No          Social History   Tobacco Use  Smoking Status Former Smoker  Smokeless Tobacco Former Environmental health practitioner Met:  Proper associated with RPD/PD & O2 Sat Independence with exercise equipment Exercise tolerated well No report of cardiac concerns or symptoms Strength training completed today  Goals Unmet:  Not Applicable  Comments: Pt able to follow exercise prescription today without complaint.  Will continue to monitor for progression.    Dr. Emily Filbert is Medical Director for [place] and [facility].

## 2018-03-07 DIAGNOSIS — J849 Interstitial pulmonary disease, unspecified: Secondary | ICD-10-CM | POA: Diagnosis not present

## 2018-03-07 NOTE — Progress Notes (Signed)
Daily Session Note  Patient Details  Name: Paul Cross MRN: [id number] Date of Birth: 04-13-1918 Referring Provider:     Pulmonary Rehab from 02/13/2018 in [facility] Cardiac and Pulmonary Rehab  Referring Provider  McQuaid      Encounter Date: 03/07/2018  Check In: Session Check In - 03/07/18 0949      Check-In   Supervising physician immediately available to respond to emergencies  [facility] immediately available ER MD    Physician(s)  Dr. Mariea Clonts and Dr. Alfred Levins    Location  [facility]-Cardiac & Pulmonary Rehab    Staff Present  Alberteen Sam, MA, RCEP, CCRP, Exercise Physiologist;Helena [facility]ardo [facility], [place], ACSM CEP, Exercise Physiologist    Medication changes reported      No    Fall or balance concerns reported     No    Warm-up and Cool-down  Performed as group-led instruction    Resistance Training Performed  Yes    VAD Patient?  No      Pain Assessment   Currently in Pain?  No/denies          Social History   Tobacco Use  Smoking Status Former Smoker  Smokeless Tobacco Former Environmental health practitioner Met:  Independence with exercise equipment Exercise tolerated well No report of cardiac concerns or symptoms Strength training completed today  Goals Unmet:  Not Applicable  Comments: Pt able to follow exercise prescription today without complaint.  Will continue to monitor for progression.    Dr. Emily Filbert is Medical Director for [place] and [facility].

## 2018-03-09 DIAGNOSIS — J849 Interstitial pulmonary disease, unspecified: Secondary | ICD-10-CM

## 2018-03-09 NOTE — Progress Notes (Signed)
Daily Session Note  Patient Details  Name: Paul Cross MRN: [id number] Date of Birth: 26-Nov-1917 Referring Provider:     Pulmonary Rehab from 02/13/2018 in [facility] Cardiac and Pulmonary Rehab  Referring Provider  McQuaid      Encounter Date: 03/09/2018  Check In: Session Check In - 03/09/18 1019      Check-In   Supervising physician immediately available to respond to emergencies  [facility] immediately available ER MD    Physician(s)  Dr. Archie Balboa and Quentin Cornwall    Location  [facility]-Cardiac & Pulmonary Rehab    Staff Present  Nada Maclachlan, BA, ACSM CEP, Exercise Physiologist;Meredith Sherryll Burger, RN BSN;Ambrielle Kington RCP,RRT,BSRT    Medication changes reported      No    Fall or balance concerns reported     No    Warm-up and Cool-down  Performed as group-led Higher education careers adviser Performed  Yes    VAD Patient?  No      Pain Assessment   Currently in Pain?  No/denies          Social History   Tobacco Use  Smoking Status Former Smoker  Smokeless Tobacco Former Environmental health practitioner Met:  Independence with exercise equipment Exercise tolerated well No report of cardiac concerns or symptoms Strength training completed today  Goals Unmet:  Not Applicable  Comments: Pt able to follow exercise prescription today without complaint.  Will continue to monitor for progression.    Dr. Emily Filbert is Medical Director for [place] and [facility].

## 2018-03-12 ENCOUNTER — Encounter: Payer: Medicare Other | Admitting: *Deleted

## 2018-03-12 DIAGNOSIS — J849 Interstitial pulmonary disease, unspecified: Secondary | ICD-10-CM

## 2018-03-12 NOTE — Progress Notes (Signed)
Pulmonary Individual Treatment Plan  Patient Details  Name: Paul Cross MRN: [id number] Date of Birth: 1917-09-10 Referring Provider:     Pulmonary Rehab from 02/13/2018 in [facility] Cardiac and Pulmonary Rehab  Referring Provider  McQuaid      Initial Encounter Date:    Pulmonary Rehab from 02/13/2018 in [facility] Cardiac and Pulmonary Rehab  Date  02/13/18      Visit Diagnosis: ILD (interstitial lung disease) ([place])  Patient's Home Medications on Admission:  Current Outpatient Medications:  .  acetaminophen (TYLENOL) 325 MG tablet, Take 975 mg by mouth 3 (three) times daily as needed for mild pain, moderate pain, fever or headache., Disp: , Rfl:  .  amLODipine (NORVASC) 5 MG tablet, Take 5 mg by mouth daily., Disp: , Rfl:  .  aspirin EC 81 MG tablet, Take 81 mg by mouth daily., Disp: , Rfl:  .  atorvastatin (LIPITOR) 40 MG tablet, Take 20 mg by mouth at bedtime., Disp: , Rfl:  .  cholecalciferol (VITAMIN D) 1000 units tablet, Take 1,000 Units by mouth daily., Disp: , Rfl:  .  hydrocortisone cream 1 %, Apply 1 application topically 2 (two) times daily. FOR TAILBONE, Disp: , Rfl:  .  ipratropium-albuterol (DUONEB) 0.5-2.5 (3) MG/3ML SOLN, Take 3 mLs by nebulization 3 (three) times daily., Disp: 360 mL, Rfl: 3 .  lactulose (CEPHULAC) 10 g packet, Take 1 packet (10 g total) by mouth at bedtime., Disp: 30 each, Rfl: 0 .  levothyroxine (SYNTHROID, LEVOTHROID) 137 MCG tablet, Take 137 mcg by mouth daily before breakfast. , Disp: , Rfl:  .  lidocaine (LIDODERM) 5 %, Place 1 patch onto the skin daily. Remove & Discard patch within 12 hours or as directed by MD, Disp: 7 patch, Rfl: 0 .  Multiple Vitamin (MULTIVITAMIN) capsule, Take 1 capsule by mouth daily., Disp: , Rfl:  .  tamsulosin (FLOMAX) 0.4 MG CAPS capsule, Take 0.4 mg by mouth daily., Disp: , Rfl:  .  tiotropium (SPIRIVA HANDIHALER) 18 MCG inhalation capsule, Place 1 capsule (18 mcg total) into inhaler and inhale daily., Disp: 30  capsule, Rfl: 11 .  umeclidinium-vilanterol (ANORO ELLIPTA) 62.5-25 MCG/INH AEPB, Inhale 1 puff into the lungs daily., Disp: 1 each, Rfl: 0 .  vitamin B-12 (CYANOCOBALAMIN) 1000 MCG tablet, Take 1,000 mcg by mouth daily., Disp: , Rfl:  No current facility-administered medications for this visit.   Facility-Administered Medications Ordered in Other Visits:  .  albuterol (PROVENTIL) (2.5 MG/3ML) 0.083% nebulizer solution 2.5 mg, 2.5 mg, Nebulization, Once, Juanito Doom, MD  Past Medical History: Past Medical History:  Diagnosis Date  . Hyperlipidemia   . Hypertension   . 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 Latest Ref Rng & Units 01/01/2018 02/08/2018   Hemoglobin A1c 4.6 - 6.5 % - 5.9   PHART 7.350 - 7.450 7.45 -   PCO2ART 32.0 - 48.0 mmHg 36 -   HCO3 20.0 - 28.0 mmol/L 25.0 -   O2SAT % 89.7 -       Pulmonary Assessment Scores: Pulmonary Assessment Scores    Row Name 02/13/18 1256 02/16/18 1216       ADL UCSD   ADL Phase  Entry  Entry    SOB Score total  79  -    Rest  3  -    Walk  5  -    Stairs  4  -    Bath  3  -    Dress  3  -    Shop  4  -      CAT Score   CAT Score  18  -      mMRC Score   mMRC Score  -  1       Pulmonary Function Assessment: Pulmonary Function Assessment - 02/13/18 1255      Breath   Shortness of Breath  Yes;Limiting activity       Exercise Target Goals: Exercise Program Goal: Individual exercise prescription set using results from initial 6 min walk test and THRR while considering  patient's activity barriers and safety.   Exercise Prescription Goal: Initial exercise prescription builds to 30-45 minutes a day of aerobic activity, 2-3 days per week.  Home exercise guidelines will be given to patient during program as part of  exercise prescription that the participant will acknowledge.  Activity Barriers & Risk Stratification: Activity Barriers & Cardiac Risk Stratification - 02/13/18 1259      Activity Barriers & Cardiac Risk Stratification   Activity Barriers  Assistive Device;Shortness of Breath       6 Minute Walk: 6 Minute Walk    Row Name 02/13/18 1140         6 Minute Walk   Distance  825 feet     Walk Time  6 minutes     # of Rest Breaks  0     MPH  1.56     METS  2.15     RPE  13     Perceived Dyspnea   2     VO2 Peak  2.9     Resting HR  77 bpm     Resting BP  118/60     Resting Oxygen Saturation   96 %     Exercise Oxygen Saturation  during 6 min walk  83 %     Max Ex. HR  103 bpm     Max Ex. BP  138/62     2 Minute Post BP  128/68       Interval HR   1 Minute HR  93     2 Minute HR  94     3 Minute HR  94     4 Minute HR  99     5 Minute HR  98     6 Minute HR  103     2 Minute Post HR  95     Interval Heart Rate?  Yes       Interval Oxygen   Interval Oxygen?  Yes     Baseline Oxygen Saturation %  96 %     1 Minute Oxygen Saturation %  88 %     1 Minute Liters of Oxygen  2 L     2 Minute Oxygen Saturation %  89 %     2 Minute Liters of Oxygen  2 L     3 Minute Oxygen Saturation %  86 %     3 Minute Liters of Oxygen  2 L     4 Minute Oxygen Saturation %  87 %     4 Minute Liters of Oxygen  2 L     5 Minute Oxygen Saturation %  84 %     5 Minute Liters of Oxygen  2 L     6 Minute Oxygen Saturation %  83 %     6 Minute Liters of Oxygen  2 L     2 Minute Post Oxygen Saturation %  94 %     2 Minute Post Liters of Oxygen  2 L       Oxygen Initial Assessment: Oxygen Initial Assessment - 02/13/18 1254      Home Oxygen   Home Oxygen Device  Home Concentrator;E-Tanks    Sleep Oxygen Prescription  Continuous    Liters per minute  2    Home Exercise Oxygen Prescription  Continuous    Liters per minute  2    Home at Rest Exercise Oxygen Prescription  Continuous     Liters per minute  2    Compliance with Home Oxygen Use  Yes      Initial 6 min Walk   Oxygen Used  Continuous    Liters per minute  2      Program Oxygen Prescription   Program Oxygen Prescription  Continuous    Liters per minute  2      Intervention   Short Term Goals  To learn and exhibit compliance with exercise, home and travel O2 prescription;To learn and understand importance of monitoring SPO2 with pulse oximeter and demonstrate accurate use of the pulse oximeter.;To learn and understand importance of maintaining oxygen saturations>88%;To learn and demonstrate proper pursed lip breathing techniques or other breathing techniques.;To learn and demonstrate proper use of respiratory medications    Long  Term Goals  Exhibits compliance with exercise, home and travel O2 prescription;Verbalizes importance of monitoring SPO2 with pulse oximeter and return demonstration;Maintenance of O2 saturations>88%;Exhibits proper breathing techniques, such as pursed lip breathing or other method taught during program session;Compliance with respiratory medication;Demonstrates proper use of MDI's       Oxygen Re-Evaluation: Oxygen Re-Evaluation    Row Name 02/16/18 1019 02/28/18 1103           Program Oxygen Prescription   Program Oxygen Prescription  Continuous  Continuous;E-Tanks      Liters per minute  2  -        Home Oxygen   Home Oxygen Device  Home Concentrator;E-Tanks  Home Concentrator;E-Tanks      Sleep Oxygen Prescription  Continuous  Continuous      Liters per minute  2  2      Home Exercise Oxygen Prescription  Continuous  Continuous      Liters per minute  2  2      Home at Rest Exercise Oxygen Prescription  Continuous  Continuous      Liters per minute  2  2      Compliance with Home Oxygen Use  Yes  -        Goals/Expected Outcomes   Short Term Goals  To learn and exhibit compliance with exercise, home and travel O2 prescription;To learn and understand importance of  monitoring SPO2 with pulse oximeter and demonstrate accurate use of the pulse oximeter.;To learn and understand importance of maintaining oxygen saturations>88%;To learn and demonstrate proper pursed lip breathing techniques or other breathing techniques.;To learn and demonstrate proper use of respiratory medications  To learn and exhibit compliance with exercise, home and travel O2 prescription;To learn and understand importance of monitoring SPO2 with pulse oximeter and demonstrate accurate use of the pulse oximeter.;To learn and understand importance of maintaining oxygen saturations>88%;To learn and demonstrate proper pursed lip breathing techniques or other breathing  techniques.;To learn and demonstrate proper use of respiratory medications      Long  Term Goals  Exhibits compliance with exercise, home and travel O2 prescription;Verbalizes importance of monitoring SPO2 with pulse oximeter and return demonstration;Maintenance of O2 saturations>88%;Exhibits proper breathing techniques, such as pursed lip breathing or other method taught during program session;Compliance with respiratory medication;Demonstrates proper use of MDI's  Exhibits compliance with exercise, home and travel O2 prescription;Verbalizes importance of monitoring SPO2 with pulse oximeter and return demonstration;Maintenance of O2 saturations>88%;Exhibits proper breathing techniques, such as pursed lip breathing or other method taught during program session;Compliance with respiratory medication      Comments  Reviewed PLB technique with pt.  Talked about how it work and it's important to maintaining his exercise saturations.    Reviewed SPO2 readings with Bug. He verbalized understanding of >90 resting and >88 with exercise. He stated that he sees it drop to 85 when he is active sometimes so he stops and purselipped breathes to increase the level back to 90 or above.       Goals/Expected Outcomes  Short: Become more profiecient at using  PLB.   Long: Become independent at using PLB.  STG: continues to use his PLB techniques to keep O2 sat above 88%, uses his nebulizer correctly. LTG Using PLB technique without coaching when saturation drops below 88%, maintains proper use of nebulizer.           Oxygen Discharge (Final Oxygen Re-Evaluation): Oxygen Re-Evaluation - 02/28/18 1103      Program Oxygen Prescription   Program Oxygen Prescription  Continuous;E-Tanks      Home Oxygen   Home Oxygen Device  Home Concentrator;E-Tanks    Sleep Oxygen Prescription  Continuous    Liters per minute  2    Home Exercise Oxygen Prescription  Continuous    Liters per minute  2    Home at Rest Exercise Oxygen Prescription  Continuous    Liters per minute  2      Goals/Expected Outcomes   Short Term Goals  To learn and exhibit compliance with exercise, home and travel O2 prescription;To learn and understand importance of monitoring SPO2 with pulse oximeter and demonstrate accurate use of the pulse oximeter.;To learn and understand importance of maintaining oxygen saturations>88%;To learn and demonstrate proper pursed lip breathing techniques or other breathing techniques.;To learn and demonstrate proper use of respiratory medications    Long  Term Goals  Exhibits compliance with exercise, home and travel O2 prescription;Verbalizes importance of monitoring SPO2 with pulse oximeter and return demonstration;Maintenance of O2 saturations>88%;Exhibits proper breathing techniques, such as pursed lip breathing or other method taught during program session;Compliance with respiratory medication    Comments  Reviewed SPO2 readings with Bug. He verbalized understanding of >90 resting and >88 with exercise. He stated that he sees it drop to 85 when he is active sometimes so he stops and purselipped breathes to increase the level back to 90 or above.     Goals/Expected Outcomes  STG: continues to use his PLB techniques to keep O2 sat above 88%, uses his  nebulizer correctly. LTG Using PLB technique without coaching when saturation drops below 88%, maintains proper use of nebulizer.         Initial Exercise Prescription: Initial Exercise Prescription - 02/13/18 1100      Date of Initial Exercise RX and Referring Provider   Date  02/13/18    Referring Provider  McQuaid      Oxygen   Oxygen  Continuous  Liters  2      Treadmill   MPH  1    Grade  0    Minutes  15    METs  1.77      NuStep   Level  2    SPM  80    Minutes  15    METs  1.5      Arm Ergometer   Level  1    Minutes  15    METs  1.5      Track   Minutes  15      Prescription Details   Frequency (times per week)  3    Duration  Progress to 45 minutes of aerobic exercise without signs/symptoms of physical distress      Intensity   THRR 40-80% of Max Heartrate  95-113    Ratings of Perceived Exertion  11-13    Perceived Dyspnea  0-4      Resistance Training   Training Prescription  Yes    Weight  2 lb    Reps  10-15       Perform Capillary Blood Glucose checks as needed.  Exercise Prescription Changes: Exercise Prescription Changes    Row Name 03/01/18 0800 03/02/18 1100           Response to Exercise   Blood Pressure (Admit)  132/72  -      Blood Pressure (Exit)  124/62  -      Heart Rate (Admit)  84 bpm  -      Heart Rate (Exercise)  109 bpm  -      Heart Rate (Exit)  97 bpm  -      Oxygen Saturation (Admit)  92 %  -      Oxygen Saturation (Exercise)  87 %  -      Oxygen Saturation (Exit)  93 %  -      Rating of Perceived Exertion (Exercise)  14  -      Perceived Dyspnea (Exercise)  2  -      Symptoms  none  -      Duration  Continue with 45 min of aerobic exercise without signs/symptoms of physical distress.  -      Intensity  THRR unchanged  -        Progression   Progression  Continue to progress workloads to maintain intensity without signs/symptoms of physical distress.  -      Average METs  2  -        Resistance Training    Training Prescription  Yes  -      Weight   3 lb  -      Reps  10-15  -        Interval Training   Interval Training  No  -        Oxygen   Oxygen  Continuous  -      Liters  4  -        Arm Ergometer   Level  1  -      Minutes  15  -      METs  1.2  -        Track   Minutes  15 25 laps  -        Home Exercise Plan   Plans to continue exercise at  -  Home (comment)      Frequency  -  Add 2 additional  days to program exercise sessions.      Initial Home Exercises Provided  -  03/02/18         Exercise Comments: Exercise Comments    Row Name 02/16/18 1018 03/02/18 1117         Exercise Comments  First full day of exercise!  Patient was oriented to gym and equipment including functions, settings, policies, and procedures.  Patient's individual exercise prescription and treatment plan were reviewed.  All starting workloads were established based on the results of the 6 minute walk test done at initial orientation visit.  The plan for exercise progression was also introduced and progression will be customized based on patient's performance and goals.  Reviewed home exercise with pt today.  Pt plans to walk and consider [place] for exercise.  Reviewed THR, pulse, RPE, sign and symptoms, NTG use, and when to call 911 or MD.  Also discussed weather considerations and indoor options.  Pt voiced understanding.         Exercise Goals and Review: Exercise Goals    Row Name 02/13/18 1139             Exercise Goals   Increase Physical Activity  Yes       Intervention  Provide advice, education, support and counseling about physical activity/exercise needs.;Develop an individualized exercise prescription for aerobic and resistive training based on initial evaluation findings, risk stratification, comorbidities and participant's personal goals.       Expected Outcomes  Short Term: Attend rehab on a regular basis to increase amount of physical activity.;Long Term: Add in home  exercise to make exercise part of routine and to increase amount of physical activity.;Long Term: Exercising regularly at least 3-5 days a week.       Increase Strength and Stamina  Yes       Intervention  Provide advice, education, support and counseling about physical activity/exercise needs.;Develop an individualized exercise prescription for aerobic and resistive training based on initial evaluation findings, risk stratification, comorbidities and participant's personal goals.       Expected Outcomes  Short Term: Increase workloads from initial exercise prescription for resistance, speed, and METs.;Short Term: Perform resistance training exercises routinely during rehab and add in resistance training at home;Long Term: Improve cardiorespiratory fitness, muscular endurance and strength as measured by increased METs and functional capacity (6MWT)       Able to understand and use rate of perceived exertion (RPE) scale  Yes       Intervention  Provide education and explanation on how to use RPE scale       Expected Outcomes  Short Term: Able to use RPE daily in rehab to express subjective intensity level;Long Term:  Able to use RPE to guide intensity level when exercising independently       Able to understand and use Dyspnea scale  Yes       Intervention  Provide education and explanation on how to use Dyspnea scale       Expected Outcomes  Short Term: Able to use Dyspnea scale daily in rehab to express subjective sense of shortness of breath during exertion;Long Term: Able to use Dyspnea scale to guide intensity level when exercising independently       Knowledge and understanding of Target Heart Rate Range (THRR)  Yes       Intervention  Provide education and explanation of THRR including how the numbers were predicted and where they are located for reference  Expected Outcomes  Short Term: Able to state/look up THRR;Short Term: Able to use daily as guideline for intensity in rehab;Long Term:  Able to use THRR to govern intensity when exercising independently       Able to check pulse independently  Yes       Intervention  Provide education and demonstration on how to check pulse in carotid and radial arteries.;Review the importance of being able to check your own pulse for safety during independent exercise       Expected Outcomes  Short Term: Able to explain why pulse checking is important during independent exercise;Long Term: Able to check pulse independently and accurately       Understanding of Exercise Prescription  Yes       Intervention  Provide education, explanation, and written materials on patient's individual exercise prescription       Expected Outcomes  Short Term: Able to explain program exercise prescription;Long Term: Able to explain home exercise prescription to exercise independently          Exercise Goals Re-Evaluation : Exercise Goals Re-Evaluation    Row Name 02/16/18 1018 03/01/18 0844 03/02/18 1117         Exercise Goal Re-Evaluation   Exercise Goals Review  Understanding of Exercise Prescription;Able to understand and use rate of perceived exertion (RPE) scale;Able to understand and use Dyspnea scale;Knowledge and understanding of Target Heart Rate Range (THRR)  Increase Physical Activity;Increase Strength and Stamina;Able to understand and use rate of perceived exertion (RPE) scale;Able to understand and use Dyspnea scale  Increase Physical Activity;Able to understand and use rate of perceived exertion (RPE) scale;Knowledge and understanding of Target Heart Rate Range (THRR);Understanding of Exercise Prescription;Increase Strength and Stamina;Able to understand and use Dyspnea scale     Comments  Reviewed RPE scale, THR and program prescription with pt today.  Pt voiced understanding and was given a copy of goals to take home.   Bug is tolerating exercise very well.  He did 25 laps walking and has increased strength training to 3 lb.  Reviewed home exercise  with pt today.  Pt plans to walk and consider [place] for exercise.  Reviewed THR, pulse, RPE, sign and symptoms, NTG use, and when to call 911 or MD.  Also discussed weather considerations and indoor options.  Pt voiced understanding.     Expected Outcomes  Short: Use RPE daily to regulate intensity. Long: Follow program prescription in THR.  Short - attend class consistently Long - increase MET level  Short - walk on days not at [employer] - join [employer] to maintain exercise        Discharge Exercise Prescription (Final Exercise Prescription Changes): Exercise Prescription Changes - 03/02/18 1100      Home Exercise Plan   Plans to continue exercise at  Home (comment)    Frequency  Add 2 additional days to program exercise sessions.    Initial Home Exercises Provided  03/02/18       Nutrition:  Target Goals: Understanding of nutrition guidelines, daily intake of sodium '1500mg'$ , cholesterol '200mg'$ , calories 30% from fat and 7% or less from saturated fats, daily to have 5 or more servings of fruits and vegetables.  Biometrics: Pre Biometrics - 02/13/18 1138      Pre Biometrics   Height  6' (1.829 m)    Weight  189 lb 1.6 oz (85.8 kg)    Waist Circumference  37.75 inches    Hip Circumference  42 inches  Waist to Hip Ratio  0.9 %    BMI (Calculated)  25.64        Nutrition Therapy Plan and Nutrition Goals: Nutrition Therapy & Goals - 02/21/18 1044      Nutrition Therapy   Diet  TLC    Protein (specify units)  8oz    Fiber  30 grams    Whole Grain Foods  3 servings   does not always choose whole grains   Saturated Fats  14 max. grams    Fruits and Vegetables  6 servings/day   8 ideal; eats a variety of fruits and vegetables but does not have a large appetite   Sodium  2000 grams      Personal Nutrition Goals   Nutrition Goal  Continue to drink three 8oz Ensure nutritional drinks daily between meals. However, if you find that you are too full to eat at meal times,  consider drinking one less per day to prioritize whole food nutrition    Personal Goal #2  Maintain current diet variety and prioritize fluids daily, ideally drinking more water than sweet tea    Comments  He eats 3 meals per day and chooses fruit and occasionally ice cream as snacks. He usually has cereal and OJ for breakfast, a sandwich or salad for lunch, and his dinners are provided by his daughter. Reports appetite to have decreased over the past year. Ensure supplements are provided by the [facility], educate and counsel regarding individualized specific dietary modifications aiming towards targeted core components such as weight, hypertension, lipid management, diabetes, heart failure and other comorbidities.    Expected Outcomes  Short Term Goal: A plan has been developed with personal nutrition goals set during dietitian appointment.;Long Term Goal: Adherence to prescribed nutrition plan.;Short Term Goal: Understand basic principles of dietary content, such as calories, fat, sodium, cholesterol and nutrients.       Nutrition Assessments:   Nutrition Goals Re-Evaluation: Nutrition Goals Re-Evaluation    Row Name 02/21/18 1053             Goals   Nutrition Goal  Continue to drink three 8oz Ensure nutritional drinks daily between meals. However, if you find that you are too full to eat at meal times, consider drinking one less per day to prioritize whole food nutrition       Comment  He drinks 3 Ensure supplements per day as provided by the [place]. C/o decreased appetite but is able to eat 3 meals + supplements currently.       Expected Outcome  He will prioritize nutrition from whole foods over supplement drinks, and continue to eat on a regular schedule         Personal Goal #2 Re-Evaluation   Personal Goal #2  Maintain current diet variety and prioritize fluids daily, ideally drinking more water than sweet tea          Nutrition Goals  Discharge (Final Nutrition Goals Re-Evaluation): Nutrition Goals Re-Evaluation - 02/21/18 1053      Goals   Nutrition Goal  Continue to drink three 8oz Ensure nutritional drinks daily between meals. However, if you find that you are too full to eat at meal times, consider drinking one less per day to prioritize whole food nutrition    Comment  He drinks 3 Ensure supplements per day as provided by the [place]. C/o decreased appetite but is able to eat 3 meals + supplements  currently.    Expected Outcome  He will prioritize nutrition from whole foods over supplement drinks, and continue to eat on a regular schedule      Personal Goal #2 Re-Evaluation   Personal Goal #2  Maintain current diet variety and prioritize fluids daily, ideally drinking more water than sweet tea       Psychosocial: Target Goals: Acknowledge presence or absence of significant depression and/or stress, maximize coping skills, provide positive support system. Participant is able to verbalize types and ability to use techniques and skills needed for reducing stress and depression.   Initial Review & Psychosocial Screening: Initial Psych Review & Screening - 02/13/18 1259      Initial Review   Current issues with  None Identified   Was doing yard work up to last event in July. Does miss getting out to do his own [place]?  Yes   Daughter     Barriers   Psychosocial barriers to participate in program  There are no identifiable barriers or psychosocial needs.;The patient should benefit from training in stress management and relaxation.      Screening Interventions   Interventions  Encouraged to exercise    Expected Outcomes  Short Term goal: Utilizing psychosocial counselor, staff and physician to assist with identification of specific Stressors or current issues interfering with healing process. Setting desired goal for each stressor or current issue identified.;Long Term Goal:  Stressors or current issues are controlled or eliminated.;Short Term goal: Identification and review with participant of any Quality of Life or Depression concerns found by scoring the questionnaire.;Long Term goal: The participant improves quality of Life and PHQ9 Scores as seen by post scores and/or verbalization of changes       Quality of Life Scores:  Scores of 19 and below usually indicate a poorer quality of life in these areas.  A difference of  2-3 points is a clinically meaningful difference.  A difference of 2-3 points in the total score of the Quality of Life Index has been associated with significant improvement in overall quality of life, self-image, physical symptoms, and general health in studies assessing change in quality of life.  PHQ-9: Recent Review Flowsheet Data    Depression screen [facility] 2/9 02/21/2018 02/08/2018   Decreased Interest 2 0   Down, Depressed, Hopeless 1 1   PHQ - 2 Score 3 1   Altered sleeping 1 -   Tired, decreased energy 1 -   Change in appetite 2 -   Feeling bad or failure about yourself  0 -   Trouble concentrating 0 -   Moving slowly or fidgety/restless 2 -   Suicidal thoughts 0 -   PHQ-9 Score 9 -   Difficult doing work/chores Somewhat difficult -     Interpretation of Total Score  Total Score Depression Severity:  1-4 = Minimal depression, 5-9 = Mild depression, 10-14 = Moderate depression, 15-19 = Moderately severe depression, 20-27 = Severe depression   Psychosocial Evaluation and Intervention:   Psychosocial Re-Evaluation: Psychosocial Re-Evaluation    [place] Name 02/21/18 1100             Psychosocial Re-Evaluation   Current issues with  Current Stress Concerns       Comments  Counselor met with Mr. Gilkison "Bug" today for initial psychosocial evaluation.  He is an amazing 84 year old who has been in fairly good health most of his life.  Bug has  a strong support system with a daughter who lives close by and does most of his driving  for him currently; a strong faith community; and the [place] has nurses visiting and checking in on him regularly.  He reports sleeping well (approximately 8 hours) and has a "fair" appetite.  Bug denies a history of depression or anxiety or any current symptoms and he is typically in a good mood.  Counselor reviewed the PHQ-9 initial score of "13" indicating moderate symptoms of depression - but Bug changed a few of the answers about sleep and energy which decreased his score to a "9" indicating mild symptoms present and mostly related to his physical health.  But states the stress he is currently experiencing relates to being on oxygen full time and how his health and the O2 tank limit him from driving and going places he wants to whenever he wants.  He has goals for this program to get off the full time oxygen and be able to drive himself at least short distances again.  Staff will follow with Bug.       Expected Outcomes  Short:  Bug will exercise consistently to improve his health and manage his stress better.   Long:  Bug will hopefully be able to decrease his oxygen tank use and be able to drive short distances again.         Continue Psychosocial Services   Follow up required by staff          Psychosocial Discharge (Final Psychosocial Re-Evaluation): Psychosocial Re-Evaluation - 02/21/18 1100      Psychosocial Re-Evaluation   Current issues with  Current Stress Concerns    Comments  Counselor met with Mr. Punt "Bug" today for initial psychosocial evaluation.  He is an amazing 84 year old who has been in fairly good health most of his life.  Bug has a strong support system with a daughter who lives close by and does most of his driving for him currently; a strong faith community; and the [place] has nurses visiting and checking in on him regularly.  He reports sleeping well (approximately 8 hours) and has a "fair" appetite.  Bug denies a history of depression or anxiety or any current symptoms and he is  typically in a good mood.  Counselor reviewed the PHQ-9 initial score of "13" indicating moderate symptoms of depression - but Bug changed a few of the answers about sleep and energy which decreased his score to a "9" indicating mild symptoms present and mostly related to his physical health.  But states the stress he is currently experiencing relates to being on oxygen full time and how his health and the O2 tank limit him from driving and going places he wants to whenever he wants.  He has goals for this program to get off the full time oxygen and be able to drive himself at least short distances again.  Staff will follow with Bug.    Expected Outcomes  Short:  Bug will exercise consistently to improve his health and manage his stress better.   Long:  Bug will hopefully be able to decrease his oxygen tank use and be able to drive short distances again.      Continue Psychosocial Services   Follow up required by staff       Education: Education Goals: Education classes will be provided on a weekly basis, covering required topics. Participant will state understanding/return demonstration of topics presented.  Learning Barriers/Preferences: Learning Barriers/Preferences -  02/13/18 1300      Learning Barriers/Preferences   Learning Barriers  Hearing   Wears hearing aids.  does not always were them   Learning Preferences  None       Education Topics:  Initial Evaluation Education: - Verbal, written and demonstration of respiratory meds, oximetry and breathing techniques. Instruction on use of nebulizers and MDIs and importance of monitoring MDI activations.   Pulmonary Rehab from 03/07/2018 in [facility] Cardiac and Pulmonary Rehab  Date  02/13/18  Educator  SB  Instruction Review Code  1- Verbalizes Understanding      General Nutrition Guidelines/Fats and Fiber: -Group instruction provided by verbal, written material, models and posters to present the general guidelines for heart healthy  nutrition. Gives an explanation and review of dietary fats and fiber.   Pulmonary Rehab from 03/07/2018 in [facility] Cardiac and Pulmonary Rehab  Date  02/28/18  Educator  LB  Instruction Review Code  1- Verbalizes Understanding      Controlling Sodium/Reading Food Labels: -Group verbal and written material supporting the discussion of sodium use in heart healthy nutrition. Review and explanation with models, verbal and written materials for utilization of the food label.   Pulmonary Rehab from 03/07/2018 in [facility] Cardiac and Pulmonary Rehab  Date  03/07/18  Educator  LB  Instruction Review Code  1- Verbalizes Understanding      Exercise Physiology & General Exercise Guidelines: - Group verbal and written instruction with models to review the exercise physiology of the cardiovascular system and associated critical values. Provides general exercise guidelines with specific guidelines to those with heart or lung disease.    Aerobic Exercise & Resistance Training: - Gives group verbal and written instruction on the various components of exercise. Focuses on aerobic and resistive training programs and the benefits of this training and how to safely progress through these programs.   Flexibility, Balance, Mind/Body Relaxation: Provides group verbal/written instruction on the benefits of flexibility and balance training, including mind/body exercise modes such as yoga, pilates and tai chi.  Demonstration and skill practice provided.   Stress and Anxiety: - Provides group verbal and written instruction about the health risks of elevated stress and causes of high stress.  Discuss the correlation between heart/lung disease and anxiety and treatment options. Review healthy ways to manage with stress and anxiety.   Depression: - Provides group verbal and written instruction on the correlation between heart/lung disease and depressed mood, treatment options, and the stigmas associated with seeking  treatment.   Exercise & Equipment Safety: - Individual verbal instruction and demonstration of equipment use and safety with use of the equipment.   Pulmonary Rehab from 03/07/2018 in [facility] Cardiac and Pulmonary Rehab  Date  02/13/18  Educator  sB  Instruction Review Code  1- Verbalizes Understanding      Infection Prevention: - Provides verbal and written material to individual with discussion of infection control including proper hand washing and proper equipment cleaning during exercise session.   Pulmonary Rehab from 03/07/2018 in [facility] Cardiac and Pulmonary Rehab  Date  02/13/18  Educator  SB1  Instruction Review Code  1- Verbalizes Understanding      Falls Prevention: - Provides verbal and written material to individual with discussion of falls prevention and safety.   Pulmonary Rehab from 03/07/2018 in [facility] Cardiac and Pulmonary Rehab  Date  02/13/18  Educator  SB  Instruction Review Code  1- Verbalizes Understanding      Diabetes: - Individual verbal and written instruction  to review signs/symptoms of diabetes, desired ranges of glucose level fasting, after meals and with exercise. Advice that pre and post exercise glucose checks will be done for 3 sessions at entry of program.   Chronic Lung Diseases: - Group verbal and written instruction to review updates, respiratory medications, advancements in procedures and treatments. Discuss use of supplemental oxygen including available portable oxygen systems, continuous and intermittent flow rates, concentrators, personal use and safety guidelines. Review proper use of inhaler and spacers. Provide informative websites for self-education.    Pulmonary Rehab from 03/07/2018 in [facility] Cardiac and Pulmonary Rehab  Date  03/02/18  Educator  [facility]  Instruction Review Code  1- Verbalizes Understanding      Energy Conservation: - Provide group verbal and written instruction for methods to conserve energy, plan and organize activities.  Instruct on pacing techniques, use of adaptive equipment and posture/positioning to relieve shortness of breath.   Triggers and Exacerbations: - Group verbal and written instruction to review types of environmental triggers and ways to prevent exacerbations. Discuss weather changes, air quality and the benefits of nasal washing. Review warning signs and symptoms to help prevent infections. Discuss techniques for effective airway clearance, coughing, and vibrations.   AED/CPR: - Group verbal and written instruction with the use of models to demonstrate the basic use of the AED with the basic ABC's of resuscitation.   Anatomy and Physiology of the Lungs: - Group verbal and written instruction with the use of models to provide basic lung anatomy and physiology related to function, structure and complications of lung disease.   Anatomy & Physiology of the Heart: - Group verbal and written instruction and models provide basic cardiac anatomy and physiology, with the coronary electrical and arterial systems. Review of Valvular disease and Heart Failure   Cardiac Medications: - Group verbal and written instruction to review commonly prescribed medications for heart disease. Reviews the medication, class of the drug, and side effects.   Know Your Numbers and Risk Factors: -Group verbal and written instruction about important numbers in your health.  Discussion of what are risk factors and how they play a role in the disease process.  Review of Cholesterol, Blood Pressure, Diabetes, and BMI and the role they play in your overall health.   Sleep Hygiene: -Provides group verbal and written instruction about how sleep can affect your health.  Define sleep hygiene, discuss sleep cycles and impact of sleep habits. Review good sleep hygiene tips.    Pulmonary Rehab from 03/07/2018 in [facility] Cardiac and Pulmonary Rehab  Date  02/21/18  Educator  [facility]  Instruction Review Code  1- Verbalizes Understanding       Other: -Provides group and verbal instruction on various topics (see comments)    Knowledge Questionnaire Score: Knowledge Questionnaire Score - 02/13/18 1301      Knowledge Questionnaire Score   Pre Score  13/18   Correct responses reviewed with Bug today. He verbalized understanding and had no further questions today       Core Components/Risk Factors/Patient Goals at Admission: Personal Goals and Risk Factors at Admission - 02/13/18 1301      Core Components/Risk Factors/Patient Goals on Admission   Improve shortness of breath with ADL's  Yes    Intervention  Provide education, individualized exercise plan and daily activity instruction to help decrease symptoms of SOB with activities of daily living.    Expected Outcomes  Short Term: Improve cardiorespiratory fitness to achieve a reduction of symptoms when performing ADLs;Long Term:  Be able to perform more ADLs without symptoms or delay the onset of symptoms    Hypertension  Yes    Intervention  Provide education on lifestyle modifcations including regular physical activity/exercise, weight management, moderate sodium restriction and increased consumption of fresh fruit, vegetables, and low fat dairy, alcohol moderation, and smoking cessation.;Monitor prescription use compliance.    Expected Outcomes  Short Term: Continued assessment and intervention until BP is < 140/17m HG in hypertensive participants. < 130/846mHG in hypertensive participants with diabetes, heart failure or chronic kidney disease.;Long Term: Maintenance of blood pressure at goal levels.    Lipids  Yes    Intervention  Provide education and support for participant on nutrition & aerobic/resistive exercise along with prescribed medications to achieve LDL '70mg'$ , HDL >'40mg'$ .    Expected Outcomes  Short Term: Participant states understanding of desired cholesterol values and is compliant with medications prescribed. Participant is following exercise prescription  and nutrition guidelines.;Long Term: Cholesterol controlled with medications as prescribed, with individualized exercise RX and with personalized nutrition plan. Value goals: LDL < '70mg'$ , HDL > 40 mg.       Core Components/Risk Factors/Patient Goals Review:  Goals and Risk Factor Review    Row Name 02/28/18 1058             Core Components/Risk Factors/Patient Goals Review   Personal Goals Review  Improve shortness of breath with ADL's;Lipids;Hypertension       Review  Talked with Bug today about his goals. He does not have SOB except with extended walks.  He does well walking down his driveway and street short distance. He is concerned that he is not walking more laps each day"my legs get tired"". Reviewed that he will be here 12 weeks nad has time to strengthen his muscles and improve his walking distance. He verbalized understanding. No concerns with HTN and Cholesterol today.        Expected Outcomes  STG: Bug continues with exercise prescription. LTG Bug sees improvement in walking distance without increased SOB          Core Components/Risk Factors/Patient Goals at Discharge (Final Review):  Goals and Risk Factor Review - 02/28/18 1058      Core Components/Risk Factors/Patient Goals Review   Personal Goals Review  Improve shortness of breath with ADL's;Lipids;Hypertension    Review  Talked with Bug today about his goals. He does not have SOB except with extended walks.  He does well walking down his driveway and street short distance. He is concerned that he is not walking more laps each day"my legs get tired"". Reviewed that he will be here 12 weeks nad has time to strengthen his muscles and improve his walking distance. He verbalized understanding. No concerns with HTN and Cholesterol today.     Expected Outcomes  STG: Bug continues with exercise prescription. LTG Bug sees improvement in walking distance without increased SOB       ITP Comments: ITP Comments    Row Name 02/13/18  1247 03/12/18 0826         ITP Comments  Bug arrived, completed his 6 min walk and at that point we found out he was still receiving [place] the Medical review.  WIll use paperwork to complete ITP evaluation assessments and send to Dr MiSabra Heckor review, changes as needed and signature.   [facility]  30 day review completed. ITP sent to Dr. MaEmily Filbertirector of  [facility]. Continue with ITP unless changes are made by physician         Comments: 30 day review

## 2018-03-12 NOTE — Progress Notes (Signed)
Daily Session Note  Patient Details  Name: Paul Cross MRN: [id number] Date of Birth: June 15, 1918 Referring Provider:     Pulmonary Rehab from 02/13/2018 in [facility] Cardiac and Pulmonary Rehab  Referring Provider  McQuaid      Encounter Date: 03/12/2018  Check In: Session Check In - 03/12/18 1025      Check-In   Supervising physician immediately available to respond to emergencies  [facility] immediately available ER MD    Physician(s)  Dr. Jimmye Norman and Dr. Corky Downs    Location  [facility]-Cardiac & Pulmonary Rehab    Staff Present  Nada Maclachlan, BA, ACSM CEP, Exercise Physiologist;Joseph [facility] [place], [place], ACSM CEP, Exercise Physiologist    Medication changes reported      No    Fall or balance concerns reported     No    Tobacco Cessation  No Change    Warm-up and Cool-down  Performed as group-led instruction    Resistance Training Performed  Yes    VAD Patient?  No    PAD/SET Patient?  No      Pain Assessment   Currently in Pain?  No/denies    Multiple Pain Sites  No          Social History   Tobacco Use  Smoking Status Former Smoker  Smokeless Tobacco Former Environmental health practitioner Met:  Proper associated with RPD/PD & O2 Sat Independence with exercise equipment Exercise tolerated well No report of cardiac concerns or symptoms Strength training completed today  Goals Unmet:  Not Applicable  Comments: Pt able to follow exercise prescription today without complaint.  Will continue to monitor for progression.    Dr. Emily Filbert is Medical Director for [place] and [facility].

## 2018-03-14 DIAGNOSIS — J849 Interstitial pulmonary disease, unspecified: Secondary | ICD-10-CM

## 2018-03-14 NOTE — Progress Notes (Signed)
Daily Session Note  Patient Details  Name: Paul Cross MRN: [id number] Date of Birth: 1917/11/27 Referring Provider:     Pulmonary Rehab from 02/13/2018 in [facility] Cardiac and Pulmonary Rehab  Referring Provider  McQuaid      Encounter Date: 03/14/2018  Check In:      Social History   Tobacco Use  Smoking Status Former Smoker  Smokeless Tobacco Former Systems developer    Goals Met:  Proper associated with RPD/PD & O2 Sat Independence with exercise equipment Exercise tolerated well Strength training completed today  Goals Unmet:  Not Applicable  Comments: Pt able to follow exercise prescription today without complaint.  Will continue to monitor for progression.    Dr. Emily Filbert is Medical Director for [address] and [facility].

## 2018-03-16 DIAGNOSIS — J849 Interstitial pulmonary disease, unspecified: Secondary | ICD-10-CM

## 2018-03-16 NOTE — Progress Notes (Signed)
Daily Session Note  Patient Details  Name: Paul Cross MRN: [id number] Date of Birth: 06/16/1918 Referring Provider:     Pulmonary Rehab from 02/13/2018 in [facility] Cardiac and Pulmonary Rehab  Referring Provider  McQuaid      Encounter Date: 03/16/2018  Check In: Session Check In - 03/16/18 0949      Check-In   Supervising physician immediately available to respond to emergencies  [facility] immediately available ER MD    Physician(s)  Dr. Corky Downs and Clearnce Hasten    Location  [facility]-Cardiac & Pulmonary Rehab    Staff Present  Renita Papa, RN BSN;Yader Criger RCP,RRT,BSRT;Amanda Oletta Darter, [place], ACSM CEP, Exercise Physiologist    Medication changes reported      No    Fall or balance concerns reported     No    Warm-up and Cool-down  Performed as group-led instruction    Resistance Training Performed  Yes    VAD Patient?  No    PAD/SET Patient?  No      Pain Assessment   Currently in Pain?  No/denies          Social History   Tobacco Use  Smoking Status Former Smoker  Smokeless Tobacco Former Environmental health practitioner Met:  Independence with exercise equipment Exercise tolerated well No report of cardiac concerns or symptoms Strength training completed today  Goals Unmet:  Not Applicable  Comments: Pt able to follow exercise prescription today without complaint.  Will continue to monitor for progression.    Dr. Emily Filbert is Medical Director for [place] and [facility].

## 2018-03-19 DIAGNOSIS — J849 Interstitial pulmonary disease, unspecified: Secondary | ICD-10-CM

## 2018-03-19 NOTE — Progress Notes (Signed)
Daily Session Note  Patient Details  Name: Paul Cross MRN: [id number] Date of Birth: 1917-12-03 Referring Provider:     Pulmonary Rehab from 02/13/2018 in [facility] Cardiac and Pulmonary Rehab  Referring Provider  McQuaid      Encounter Date: 03/19/2018  Check In: Session Check In - 03/19/18 0950      Check-In   Supervising physician immediately available to respond to emergencies  [facility] immediately available ER MD    Physician(s)  Dr. Burlene Arnt and Quentin Cornwall    Location  [facility]-Cardiac & Pulmonary Rehab    Staff Present  Earlean Shawl, BS, ACSM CEP, Exercise Physiologist;Edin Skarda Foy [place], [place], ACSM CEP, Exercise Physiologist    Medication changes reported      No    Fall or balance concerns reported     No    Warm-up and Cool-down  Performed as group-led instruction    Resistance Training Performed  Yes    VAD Patient?  No    PAD/SET Patient?  No      Pain Assessment   Currently in Pain?  No/denies          Social History   Tobacco Use  Smoking Status Former Smoker  Smokeless Tobacco Former Environmental health practitioner Met:  Independence with exercise equipment Exercise tolerated well No report of cardiac concerns or symptoms Strength training completed today  Goals Unmet:  Not Applicable  Comments: Pt able to follow exercise prescription today without complaint.  Will continue to monitor for progression.    Dr. Emily Filbert is Medical Director for [place] and [facility].

## 2018-03-20 ENCOUNTER — Ambulatory Visit
Admission: RE | Admit: 2018-03-20 | Discharge: 2018-03-20 | Disposition: A | Discharge status: Home / Self Care | Payer: Medicare Other | Source: Ambulatory Visit | Attending: Pulmonary Disease | Admitting: Pulmonary Disease

## 2018-03-20 DIAGNOSIS — I2510 Atherosclerotic heart disease of native coronary artery without angina pectoris: Secondary | ICD-10-CM | POA: Insufficient documentation

## 2018-03-20 DIAGNOSIS — J849 Interstitial pulmonary disease, unspecified: Secondary | ICD-10-CM

## 2018-03-20 DIAGNOSIS — K449 Diaphragmatic hernia without obstruction or gangrene: Secondary | ICD-10-CM | POA: Diagnosis not present

## 2018-03-20 DIAGNOSIS — J430 Unilateral pulmonary emphysema [MacLeod's syndrome]: Secondary | ICD-10-CM | POA: Diagnosis not present

## 2018-03-20 DIAGNOSIS — R590 Localized enlarged lymph nodes: Secondary | ICD-10-CM | POA: Diagnosis not present

## 2018-03-20 DIAGNOSIS — R918 Other nonspecific abnormal finding of lung field: Secondary | ICD-10-CM | POA: Insufficient documentation

## 2018-03-20 DIAGNOSIS — J432 Centrilobular emphysema: Secondary | ICD-10-CM | POA: Insufficient documentation

## 2018-03-20 DIAGNOSIS — I700 Atherosclerosis of aorta: Secondary | ICD-10-CM | POA: Diagnosis not present

## 2018-03-20 DIAGNOSIS — J438 Other emphysema: Secondary | ICD-10-CM | POA: Diagnosis not present

## 2018-03-21 ENCOUNTER — Encounter: Payer: Medicare Other | Attending: Pulmonary Disease | Admitting: *Deleted

## 2018-03-21 DIAGNOSIS — Z79890 Hormone replacement therapy (postmenopausal): Secondary | ICD-10-CM | POA: Diagnosis not present

## 2018-03-21 DIAGNOSIS — Z87891 Personal history of nicotine dependence: Secondary | ICD-10-CM | POA: Diagnosis not present

## 2018-03-21 DIAGNOSIS — Z79899 Other long term (current) drug therapy: Secondary | ICD-10-CM | POA: Diagnosis not present

## 2018-03-21 DIAGNOSIS — J849 Interstitial pulmonary disease, unspecified: Secondary | ICD-10-CM | POA: Insufficient documentation

## 2018-03-21 DIAGNOSIS — E785 Hyperlipidemia, unspecified: Secondary | ICD-10-CM | POA: Insufficient documentation

## 2018-03-21 DIAGNOSIS — E039 Hypothyroidism, unspecified: Secondary | ICD-10-CM | POA: Diagnosis not present

## 2018-03-21 DIAGNOSIS — I10 Essential (primary) hypertension: Secondary | ICD-10-CM | POA: Diagnosis not present

## 2018-03-21 DIAGNOSIS — Z7982 Long term (current) use of aspirin: Secondary | ICD-10-CM | POA: Diagnosis not present

## 2018-03-21 NOTE — Progress Notes (Signed)
Daily Session Note  Patient Details  Name: Paul Cross MRN: [id number] Date of Birth: 1917/11/09 Referring Provider:     Pulmonary Rehab from 02/13/2018 in [facility] Cardiac and Pulmonary Rehab  Referring Provider  McQuaid      Encounter Date: 03/21/2018  Check In: Session Check In - 03/21/18 [place]      Check-In   Supervising physician immediately available to respond to emergencies  [facility] immediately available ER MD    Physician(s)  Drs. Joni Fears and [place]    Location  [facility]-Cardiac & Pulmonary Rehab    Staff Present  Alberteen Sam, MA, RCEP, CCRP, Exercise Physiologist;Amanda Oletta Darter, [place], ACSM CEP, Exercise Physiologist;Joseph Tessie Fass RCP,RRT,BSRT    Medication changes reported      No    Fall or balance concerns reported     No    Warm-up and Cool-down  Performed as group-led instruction    Resistance Training Performed  Yes    VAD Patient?  No    PAD/SET Patient?  No      Pain Assessment   Currently in Pain?  No/denies          Social History   Tobacco Use  Smoking Status Former Smoker  Smokeless Tobacco Former Environmental health practitioner Met:  Proper associated with RPD/PD & O2 Sat Independence with exercise equipment Using PLB without cueing & demonstrates good technique Exercise tolerated well No report of cardiac concerns or symptoms Strength training completed today  Goals Unmet:  Not Applicable  Comments: Pt able to follow exercise prescription today without complaint.  Will continue to monitor for progression.    Dr. Emily Filbert is Medical Director for [place] and [facility].

## 2018-03-23 ENCOUNTER — Encounter: Payer: Medicare Other | Admitting: *Deleted

## 2018-03-23 DIAGNOSIS — J849 Interstitial pulmonary disease, unspecified: Secondary | ICD-10-CM

## 2018-03-23 NOTE — Progress Notes (Signed)
Daily Session Note  Patient Details  Name: Paul Cross MRN: [id number] Date of Birth: 09/08/1917 Referring Provider:     Pulmonary Rehab from 02/13/2018 in [facility] Cardiac and Pulmonary Rehab  Referring Provider  McQuaid      Encounter Date: 03/23/2018  Check In: Session Check In - 03/23/18 1014      Check-In   Supervising physician immediately available to respond to emergencies  [facility] immediately available ER MD    Physician(s)  Dr. Reita Cliche and Alfred Levins    Location  [facility]-Cardiac & Pulmonary Rehab    Staff Present  Renita Papa, RN Vickki Hearing, BA, ACSM CEP, Exercise Physiologist;Nana Addai, RN BSN    Medication changes reported      No    Fall or balance concerns reported     No    Tobacco Cessation  No Change    Warm-up and Cool-down  Performed as group-led instruction    Resistance Training Performed  Yes    VAD Patient?  No    PAD/SET Patient?  No      Pain Assessment   Currently in Pain?  No/denies          Social History   Tobacco Use  Smoking Status Former Smoker  Smokeless Tobacco Former Environmental health practitioner Met:  Proper associated with RPD/PD & O2 Sat Independence with exercise equipment Using PLB without cueing & demonstrates good technique Exercise tolerated well No report of cardiac concerns or symptoms Strength training completed today  Goals Unmet:  Not Applicable  Comments: Pt able to follow exercise prescription today without complaint.  Will continue to monitor for progression.    Dr. Emily Filbert is Medical Director for [place] and [facility].

## 2018-03-26 DIAGNOSIS — J849 Interstitial pulmonary disease, unspecified: Secondary | ICD-10-CM

## 2018-03-26 NOTE — Progress Notes (Signed)
Daily Session Note  Patient Details  Name: Paul Cross MRN: [id number] Date of Birth: 09/26/1917 Referring Provider:     Pulmonary Rehab from 02/13/2018 in [facility] Cardiac and Pulmonary Rehab  Referring Provider  McQuaid      Encounter Date: 03/26/2018  Check In: Session Check In - 03/26/18 [place]      Check-In   Supervising physician immediately available to respond to emergencies  [facility] immediately available ER MD    Physician(s)  Dr. Corky Downs and Quentin Cornwall    Location  [facility]-Cardiac & Pulmonary Rehab    Staff Present  Justin Mend RCP,RRT,BSRT;Amanda Oletta Darter, [place], ACSM CEP, Exercise Physiologist;Kelly Amedeo Plenty, BS, ACSM CEP, Exercise Physiologist    Medication changes reported      No    Fall or balance concerns reported     No    Warm-up and Cool-down  Performed as group-led instruction    Resistance Training Performed  Yes    VAD Patient?  No    PAD/SET Patient?  No      Pain Assessment   Currently in Pain?  No/denies          Social History   Tobacco Use  Smoking Status Former Smoker  Smokeless Tobacco Former Environmental health practitioner Met:  Independence with exercise equipment Exercise tolerated well No report of cardiac concerns or symptoms Strength training completed today  Goals Unmet:  Not Applicable  Comments: Pt able to follow exercise prescription today without complaint.  Will continue to monitor for progression.    Dr. Emily Filbert is Medical Director for [place] and [facility].

## 2018-03-28 ENCOUNTER — Ambulatory Visit (INDEPENDENT_AMBULATORY_CARE_PROVIDER_SITE_OTHER): Payer: Medicare Other | Admitting: Pulmonary Disease

## 2018-03-28 ENCOUNTER — Encounter: Payer: Self-pay | Admitting: Pulmonary Disease

## 2018-03-28 VITALS — BP 126/64 | HR 85 | Wt 196.2 lb

## 2018-03-28 DIAGNOSIS — R0602 Shortness of breath: Secondary | ICD-10-CM | POA: Diagnosis not present

## 2018-03-28 DIAGNOSIS — J432 Centrilobular emphysema: Secondary | ICD-10-CM

## 2018-03-28 DIAGNOSIS — Z23 Encounter for immunization: Secondary | ICD-10-CM | POA: Diagnosis not present

## 2018-03-28 DIAGNOSIS — J849 Interstitial pulmonary disease, unspecified: Secondary | ICD-10-CM | POA: Diagnosis not present

## 2018-03-28 DIAGNOSIS — J9611 Chronic respiratory failure with hypoxia: Secondary | ICD-10-CM

## 2018-03-28 MED ORDER — TIOTROPIUM BROMIDE MONOHYDRATE 2.5 MCG/ACT IN AERS: 2.0000 | INHALATION_SPRAY | Freq: Every day | RESPIRATORY_TRACT | 11 refills | Status: AC

## 2018-03-28 NOTE — Progress Notes (Signed)
Synopsis: Referred in August 2019 for Dyspnea, hypoxemia.  He smoked 1 pack of cigarettes daily for at least 50 years, quit around age 84.  He worked as a Dietitian over the years.  Served in [employer] [place] [employer] in [place] and in [place].  Subjective:   [name] GENDER: male DOB: 1917/11/15, MRN: [id number]   HPI  Chief Complaint  Patient presents with  . Follow-up    wants flu shot today   His breathing has been about the same since the last visit.  He is participating in pulmonary rehab and he says that that is going okay.  He ends up walking for about 15 minutes at a time and this will make him short of breath even while wearing oxygen.  He says that he cannot really tell that wearing oxygen has made much of a difference.  He is wearing it quite frequently but he will take it off from time to time to go get the newspaper.  He has been using his albuterol nebulizer 3 times a day as well as the Symbicort and Spiriva.  Past Medical History:  Diagnosis Date  . Hyperlipidemia   . Hypertension   . Hypothyroidism    approximately 2009      Review of Systems  Constitutional: Positive for weight loss. Negative for fever.  HENT: Negative for congestion, ear pain, nosebleeds and sore throat.   Eyes: Negative for redness.  Respiratory: Positive for cough and shortness of breath. Negative for wheezing.   Cardiovascular: Positive for leg swelling. Negative for palpitations and PND.  Gastrointestinal: Negative for nausea and vomiting.  Genitourinary: Negative for dysuria.  Skin: Positive for rash.  Neurological: Negative for headaches.  Endo/Heme/Allergies: Bruises/bleeds easily.  Psychiatric/Behavioral: Negative for depression. The patient is nervous/anxious.       Objective:  Physical Exam   Vitals:   03/28/18 1343  BP: 126/64  Pulse: 85  SpO2: 93%  Weight: 196 lb 3.2 oz (89 kg)   2L   Gen: chronically ill appearing HENT: OP clear, TM's clear, neck  supple PULM: Coarse crackles bases 1/2 way up B, normal percussion CV: RRR, no mgr, trace edema GI: BS+, soft, nontender Derm: no cyanosis or rash Psyche: normal mood and affect    CBC    Component Value Date/Time   WBC 6.8 01/15/2018 0844   RBC 4.30 (L) 01/15/2018 0844   HGB 14.2 01/15/2018 0844   HCT 41.4 01/15/2018 0844   PLT 220 01/15/2018 0844   MCV 96.2 01/15/2018 0844   MCH 32.9 01/15/2018 0844   MCHC 34.2 01/15/2018 0844   RDW 13.6 01/15/2018 0844   LYMPHSABS 1.7 01/15/2018 0844   MONOABS 0.8 01/15/2018 0844   EOSABS 0.2 01/15/2018 0844   BASOSABS 0.1 01/15/2018 0844     Chest imaging: July 2019 CT chest > images independently reviewed showing significant centrilobular emphysema and paraseptal emphysema and an upper lobe predominant distribution.  There is also upper lobe predominant honeycombing and a peripheral based distribution with interstitial reticulation, specifically intralobular septal thickening.  Again the fibrotic changes are in an upper lobe predominant fashion though they are notable in the bases as well.  There is also an area of consolidation in the left upper lobe.  There is a very large bulla in the right lower lobe. October 2019 high-resolution CT chest images independently reviewed showing significant emphysema bilaterally, fibrotic changes in the bases, likely honeycombing, large bulla unchanged, scattered nodules.  Pulmonary infiltrate has cleared up.  PFT:  Labs:  Path:  Echo: July 2019 Echo: LVEF 60-65%, mod LVH, RV dilated, RVSP 35-40   Heart Catheterization:        Assessment & Plan:   Interstitial pulmonary disease (HCC)  Chronic respiratory failure with hypoxia (HCC)  Centrilobular emphysema (HCC)  Shortness of breath  Discussion: He seems to be doing fairly well with his regimen of inhaled medicines and oxygen.  I do not think he needs to use albuterol on a scheduled basis throughout the day so I have encouraged him to  just use it as needed.  I think that he should continue taking his pulmonary rehab classes regularly.  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  After discussion today we have elected to essentially do nothing for either of these.  Specifically we will get another CT scan of his chest and we will consider anti-fibrotic therapy.  I think that these things would just lead to excessive radiation exposure, cost, and definite side effects without much benefit.  At this point they have stated that they just want to focus on his quality of life.  I told him that its okay to take the oxygen off for 20 to 30 minutes while he is at rest for comfort.  However, if he does any walking or driving he absolutely must wear the oxygen.  Plan: Chronic respiratory failure with hypoxemia: Continue using 2 to 3 L of oxygen continuously and with exertion Its okay to take 20 to 30-minute breaks as long as you are at rest  Centrilobular emphysema: Continue Symbicort 2 puffs twice a day Continue Spiriva daily High-dose flu shot today Practice good hand hygiene Continue to participate in pulmonary rehab  Pulmonary nodules: As we discussed today we will not do any further imaging on this  Scarring in your lungs (interstitial lung disease: As we discussed today we will not pursue any sort of treatment for this right now.  We will see you back in 4 months or sooner if needed  Greater than 50% of this 30-minute visit spent face to face  Current Outpatient Medications:  .  acetaminophen (TYLENOL) 325 MG tablet, Take 975 mg by mouth 3 (three) times daily as needed for mild pain, moderate pain, fever or headache., Disp: , Rfl:  .  amLODipine (NORVASC) 5 MG tablet, Take 5 mg by mouth daily., Disp: , Rfl:  .  aspirin EC 81 MG tablet, Take 81 mg by mouth daily., Disp: , Rfl:  .  atorvastatin (LIPITOR) 40 MG tablet, Take 20 mg by mouth at bedtime., Disp:  , Rfl:  .  budesonide-formoterol (SYMBICORT) 160-4.5 MCG/ACT inhaler, Inhale 2 puffs into the lungs 2 (two) times daily., Disp: , Rfl:  .  cholecalciferol (VITAMIN D) 1000 units tablet, Take 1,000 Units by mouth daily., Disp: , Rfl:  .  hydrocortisone cream 1 %, Apply 1 application topically 2 (two) times daily. FOR TAILBONE, Disp: , Rfl:  .  ipratropium-albuterol (DUONEB) 0.5-2.5 (3) MG/3ML SOLN, Take 3 mLs by nebulization 3 (three) times daily., Disp: 360 mL, Rfl: 3 .  lactulose (CEPHULAC) 10 g packet, Take 1 packet (10 g total) by mouth at bedtime., Disp: 30 each, Rfl: 0 .  levothyroxine (SYNTHROID, LEVOTHROID) 137 MCG tablet, Take 137 mcg by mouth daily before breakfast. , Disp: , Rfl:  .  Multiple Vitamin (MULTIVITAMIN) capsule, Take 1 capsule by mouth daily., Disp: , Rfl:  .  tamsulosin (FLOMAX) 0.4 MG CAPS capsule, Take 0.4 mg by mouth daily., Disp: , Rfl:  .  tiotropium (SPIRIVA HANDIHALER) 18 MCG inhalation capsule, Place 1 capsule (18 mcg total) into inhaler and inhale daily., Disp: 30 capsule, Rfl: 11 .  vitamin B-12 (CYANOCOBALAMIN) 1000 MCG tablet, Take 1,000 mcg by mouth daily., Disp: , Rfl:  .  Tiotropium Bromide Monohydrate (SPIRIVA RESPIMAT) 2.5 MCG/ACT AERS, Inhale 2 puffs into the lungs daily., Disp: 1 Inhaler, Rfl: 11 No current facility-administered medications for this visit.   Facility-Administered Medications Ordered in Other Visits:  .  albuterol (PROVENTIL) (2.5 MG/3ML) 0.083% nebulizer solution 2.5 mg, 2.5 mg, Nebulization, Once, Juanito Doom, MD

## 2018-03-28 NOTE — Patient Instructions (Signed)
Chronic respiratory failure with hypoxemia: Continue using 2 to 3 L of oxygen continuously and with exertion Its okay to take 20 to 30-minute breaks as long as you are at rest  Centrilobular emphysema: Continue Symbicort 2 puffs twice a day Continue Spiriva daily High-dose flu shot today Practice good hand hygiene Continue to participate in pulmonary rehab  Pulmonary nodules: As we discussed today we will not do any further imaging on this  Scarring in your lungs (interstitial lung disease: As we discussed today we will not pursue any sort of treatment for this right now.  We will see you back in 4 months or sooner if needed

## 2018-03-30 ENCOUNTER — Encounter: Payer: Medicare Other | Admitting: *Deleted

## 2018-03-30 DIAGNOSIS — J849 Interstitial pulmonary disease, unspecified: Secondary | ICD-10-CM

## 2018-03-30 NOTE — Progress Notes (Signed)
Daily Session Note  Patient Details  Name: Paul Cross MRN: [id number] Date of Birth: 04/19/1918 Referring Provider:     Pulmonary Rehab from 02/13/2018 in [facility] Cardiac and Pulmonary Rehab  Referring Provider  McQuaid      Encounter Date: 03/30/2018  Check In: Session Check In - 03/30/18 1021      Check-In   Supervising physician immediately available to respond to emergencies  [facility] immediately available ER MD    Physician(s)  Dr. Artis Flock and Clearnce Hasten    Location  [facility]-Cardiac & Pulmonary Rehab    Staff Present  Renita Papa, RN Vickki Hearing, BA, ACSM CEP, Exercise Physiologist;Joseph Tessie Fass RCP,RRT,BSRT    Medication changes reported      No    Fall or balance concerns reported     No    Warm-up and Cool-down  Performed as group-led instruction    Resistance Training Performed  Yes    VAD Patient?  No    PAD/SET Patient?  No      Pain Assessment   Currently in Pain?  No/denies          Social History   Tobacco Use  Smoking Status Former Smoker  Smokeless Tobacco Former Environmental health practitioner Met:  Proper associated with RPD/PD & O2 Sat Independence with exercise equipment Using PLB without cueing & demonstrates good technique Exercise tolerated well No report of cardiac concerns or symptoms Strength training completed today  Goals Unmet:  Not Applicable  Comments: Pt able to follow exercise prescription today without complaint.  Will continue to monitor for progression.    Dr. Emily Filbert is Medical Director for [place] and [facility].

## 2018-04-02 DIAGNOSIS — J849 Interstitial pulmonary disease, unspecified: Secondary | ICD-10-CM | POA: Diagnosis not present

## 2018-04-02 NOTE — Progress Notes (Signed)
Daily Session Note  Patient Details  Name: Paul Cross MRN: [id number] Date of Birth: Jun 03, 1918 Referring Provider:     Pulmonary Rehab from 02/13/2018 in [facility] Cardiac and Pulmonary Rehab  Referring Provider  McQuaid      Encounter Date: 04/02/2018  Check In: Session Check In - 04/02/18 0946      Check-In   Supervising physician immediately available to respond to emergencies  [facility] immediately available ER MD    Physician(s)  Dr. Kerman Passey and [facility]    Location  [facility]-Cardiac & Pulmonary Rehab    Staff Present  Justin Mend RCP,RRT,BSRT;Amanda Oletta Darter, [place], ACSM CEP, Exercise Physiologist;Kelly Amedeo Plenty, BS, ACSM CEP, Exercise Physiologist    Medication changes reported      No    Fall or balance concerns reported     No    Warm-up and Cool-down  Performed as group-led instruction    Resistance Training Performed  Yes    VAD Patient?  No    PAD/SET Patient?  No      Pain Assessment   Currently in Pain?  No/denies          Social History   Tobacco Use  Smoking Status Former Smoker  Smokeless Tobacco Former Environmental health practitioner Met:  Independence with exercise equipment Exercise tolerated well No report of cardiac concerns or symptoms Strength training completed today  Goals Unmet:  Not Applicable  Comments: Pt able to follow exercise prescription today without complaint.  Will continue to monitor for progression.   Dr. Emily Filbert is Medical Director for [place] and [facility].

## 2018-04-04 DIAGNOSIS — J849 Interstitial pulmonary disease, unspecified: Secondary | ICD-10-CM | POA: Diagnosis not present

## 2018-04-04 NOTE — Progress Notes (Signed)
Daily Session Note  Patient Details  Name: Paul Cross MRN: [id number] Date of Birth: 12/23/1917 Referring Provider:     Pulmonary Rehab from 02/13/2018 in [facility] Cardiac and Pulmonary Rehab  Referring Provider  McQuaid      Encounter Date: 04/04/2018  Check In: Session Check In - 04/04/18 0951      Check-In   Supervising physician immediately available to respond to emergencies  [facility] immediately available ER MD    Physician(s)  Dr. Burlene Arnt and Jimmye Norman    Location  [facility]-Cardiac & Pulmonary Rehab    Staff Present  Alberteen Sam, MA, RCEP, CCRP, Exercise Physiologist;Joseph [facility], [place], ACSM CEP, Exercise Physiologist    Medication changes reported      No    Fall or balance concerns reported     No    Warm-up and Cool-down  Performed as group-led instruction    Resistance Training Performed  Yes    VAD Patient?  No    PAD/SET Patient?  No      Pain Assessment   Currently in Pain?  No/denies          Social History   Tobacco Use  Smoking Status Former Smoker  Smokeless Tobacco Former Environmental health practitioner Met:  Independence with exercise equipment Exercise tolerated well No report of cardiac concerns or symptoms Strength training completed today  Goals Unmet:  Not Applicable  Comments: Pt able to follow exercise prescription today without complaint.  Will continue to monitor for progression.    Dr. Emily Filbert is Medical Director for [place] and [facility].

## 2018-04-06 DIAGNOSIS — J849 Interstitial pulmonary disease, unspecified: Secondary | ICD-10-CM

## 2018-04-06 NOTE — Progress Notes (Signed)
Daily Session Note  Patient Details  Name: Paul Cross MRN: [id number] Date of Birth: [birth year] Referring Provider:     Pulmonary Rehab from 02/13/2018 in [facility] Cardiac and Pulmonary Rehab  Referring Provider  McQuaid      Encounter Date: 04/06/2018  Check In: Session Check In - 04/06/18 0948      Check-In   Supervising physician immediately available to respond to emergencies  [facility] immediately available ER MD    Physician(s)  Dr. Quentin Cornwall and [facility]    Location  [facility]-Cardiac & Pulmonary Rehab    Staff Present  Justin Mend RCP,RRT,BSRT;Krista Frederico Hamman, RN BSN;Meredith Sherryll Burger, RN BSN    Medication changes reported      No    Fall or balance concerns reported     No    Warm-up and Cool-down  Performed as group-led Higher education careers adviser Performed  Yes    VAD Patient?  No    PAD/SET Patient?  No      Pain Assessment   Currently in Pain?  No/denies          Social History   Tobacco Use  Smoking Status Former Smoker  Smokeless Tobacco Former Environmental health practitioner Met:  Independence with exercise equipment Exercise tolerated well No report of cardiac concerns or symptoms Strength training completed today  Goals Unmet:  Not Applicable  Comments: Pt able to follow exercise prescription today without complaint.  Will continue to monitor for progression.    Dr. Emily Filbert is Medical Director for [place] and [facility].

## 2018-04-09 ENCOUNTER — Encounter: Payer: Medicare Other | Admitting: *Deleted

## 2018-04-09 DIAGNOSIS — J849 Interstitial pulmonary disease, unspecified: Secondary | ICD-10-CM

## 2018-04-09 NOTE — Progress Notes (Signed)
Pulmonary Individual Treatment Plan  Patient Details  Name: Paul Cross MRN: [id number] Date of Birth: 1917-12-10 Referring Provider:     Pulmonary Rehab from 02/13/2018 in [facility] Cardiac and Pulmonary Rehab  Referring Provider  McQuaid      Initial Encounter Date:    Pulmonary Rehab from 02/13/2018 in [facility] Cardiac and Pulmonary Rehab  Date  02/13/18      Visit Diagnosis: ILD (interstitial lung disease) ([place])  Patient's Home Medications on Admission:  Current Outpatient Medications:  .  acetaminophen (TYLENOL) 325 MG tablet, Take 975 mg by mouth 3 (three) times daily as needed for mild pain, moderate pain, fever or headache., Disp: , Rfl:  .  amLODipine (NORVASC) 5 MG tablet, Take 5 mg by mouth daily., Disp: , Rfl:  .  aspirin EC 81 MG tablet, Take 81 mg by mouth daily., Disp: , Rfl:  .  atorvastatin (LIPITOR) 40 MG tablet, Take 20 mg by mouth at bedtime., Disp: , Rfl:  .  budesonide-formoterol (SYMBICORT) 160-4.5 MCG/ACT inhaler, Inhale 2 puffs into the lungs 2 (two) times daily., Disp: , Rfl:  .  cholecalciferol (VITAMIN D) 1000 units tablet, Take 1,000 Units by mouth daily., Disp: , Rfl:  .  hydrocortisone cream 1 %, Apply 1 application topically 2 (two) times daily. FOR TAILBONE, Disp: , Rfl:  .  ipratropium-albuterol (DUONEB) 0.5-2.5 (3) MG/3ML SOLN, Take 3 mLs by nebulization 3 (three) times daily., Disp: 360 mL, Rfl: 3 .  lactulose (CEPHULAC) 10 g packet, Take 1 packet (10 g total) by mouth at bedtime., Disp: 30 each, Rfl: 0 .  levothyroxine (SYNTHROID, LEVOTHROID) 137 MCG tablet, Take 137 mcg by mouth daily before breakfast. , Disp: , Rfl:  .  Multiple Vitamin (MULTIVITAMIN) capsule, Take 1 capsule by mouth daily., Disp: , Rfl:  .  tamsulosin (FLOMAX) 0.4 MG CAPS capsule, Take 0.4 mg by mouth daily., Disp: , Rfl:  .  tiotropium (SPIRIVA HANDIHALER) 18 MCG inhalation capsule, Place 1 capsule (18 mcg total) into inhaler and inhale daily., Disp: 30 capsule, Rfl: 11 .   Tiotropium Bromide Monohydrate (SPIRIVA RESPIMAT) 2.5 MCG/ACT AERS, Inhale 2 puffs into the lungs daily., Disp: 1 Inhaler, Rfl: 11 .  vitamin B-12 (CYANOCOBALAMIN) 1000 MCG tablet, Take 1,000 mcg by mouth daily., Disp: , Rfl:  No current facility-administered medications for this visit.   Facility-Administered Medications Ordered in Other Visits:  .  albuterol (PROVENTIL) (2.5 MG/3ML) 0.083% nebulizer solution 2.5 mg, 2.5 mg, Nebulization, Once, Juanito Doom, MD  Past Medical History: Past Medical History:  Diagnosis Date  . Hyperlipidemia   . Hypertension   . 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 Latest Ref Rng & Units 01/01/2018 02/08/2018   Hemoglobin A1c 4.6 - 6.5 % - 5.9   PHART 7.350 - 7.450 7.45 -   PCO2ART 32.0 - 48.0 mmHg 36 -   HCO3 20.0 - 28.0 mmol/L 25.0 -   O2SAT % 89.7 -       Pulmonary Assessment Scores: Pulmonary Assessment Scores    Row Name 02/13/18 1256 02/16/18 1216       ADL UCSD   ADL Phase  Entry  Entry    SOB Score total  79  -    Rest  3  -  Walk  5  -    Stairs  4  -    Bath  3  -    Dress  3  -    Shop  4  -      CAT Score   CAT Score  18  -      mMRC Score   mMRC Score  -  1       Pulmonary Function Assessment: Pulmonary Function Assessment - 02/13/18 1255      Breath   Shortness of Breath  Yes;Limiting activity       Exercise Target Goals: Exercise Program Goal: Individual exercise prescription set using results from initial 6 min walk test and THRR while considering  patient's activity barriers and safety.   Exercise Prescription Goal: Initial exercise prescription builds to 30-45 minutes a day of aerobic activity, 2-3 days per week.  Home exercise guidelines will be given to patient during program as part of exercise  prescription that the participant will acknowledge.  Activity Barriers & Risk Stratification: Activity Barriers & Cardiac Risk Stratification - 02/13/18 1259      Activity Barriers & Cardiac Risk Stratification   Activity Barriers  Assistive Device;Shortness of Breath       6 Minute Walk: 6 Minute Walk    Row Name 02/13/18 1140         6 Minute Walk   Distance  825 feet     Walk Time  6 minutes     # of Rest Breaks  0     MPH  1.56     METS  2.15     RPE  13     Perceived Dyspnea   2     VO2 Peak  2.9     Resting HR  77 bpm     Resting BP  118/60     Resting Oxygen Saturation   96 %     Exercise Oxygen Saturation  during 6 min walk  83 %     Max Ex. HR  103 bpm     Max Ex. BP  138/62     2 Minute Post BP  128/68       Interval HR   1 Minute HR  93     2 Minute HR  94     3 Minute HR  94     4 Minute HR  99     5 Minute HR  98     6 Minute HR  103     2 Minute Post HR  95     Interval Heart Rate?  Yes       Interval Oxygen   Interval Oxygen?  Yes     Baseline Oxygen Saturation %  96 %     1 Minute Oxygen Saturation %  88 %     1 Minute Liters of Oxygen  2 L     2 Minute Oxygen Saturation %  89 %     2 Minute Liters of Oxygen  2 L     3 Minute Oxygen Saturation %  86 %     3 Minute Liters of Oxygen  2 L     4 Minute Oxygen Saturation %  87 %     4 Minute Liters of Oxygen  2 L     5 Minute Oxygen Saturation %  84 %     5 Minute Liters of Oxygen  2 L  6 Minute Oxygen Saturation %  83 %     6 Minute Liters of Oxygen  2 L     2 Minute Post Oxygen Saturation %  94 %     2 Minute Post Liters of Oxygen  2 L       Oxygen Initial Assessment: Oxygen Initial Assessment - 02/13/18 1254      Home Oxygen   Home Oxygen Device  Home Concentrator;E-Tanks    Sleep Oxygen Prescription  Continuous    Liters per minute  2    Home Exercise Oxygen Prescription  Continuous    Liters per minute  2    Home at Rest Exercise Oxygen Prescription  Continuous    Liters per  minute  2    Compliance with Home Oxygen Use  Yes      Initial 6 min Walk   Oxygen Used  Continuous    Liters per minute  2      Program Oxygen Prescription   Program Oxygen Prescription  Continuous    Liters per minute  2      Intervention   Short Term Goals  To learn and exhibit compliance with exercise, home and travel O2 prescription;To learn and understand importance of monitoring SPO2 with pulse oximeter and demonstrate accurate use of the pulse oximeter.;To learn and understand importance of maintaining oxygen saturations>88%;To learn and demonstrate proper pursed lip breathing techniques or other breathing techniques.;To learn and demonstrate proper use of respiratory medications    Long  Term Goals  Exhibits compliance with exercise, home and travel O2 prescription;Verbalizes importance of monitoring SPO2 with pulse oximeter and return demonstration;Maintenance of O2 saturations>88%;Exhibits proper breathing techniques, such as pursed lip breathing or other method taught during program session;Compliance with respiratory medication;Demonstrates proper use of MDI's       Oxygen Re-Evaluation: Oxygen Re-Evaluation    Row Name 02/16/18 1019 02/28/18 1103 04/02/18 1043         Program Oxygen Prescription   Program Oxygen Prescription  Continuous  Continuous;E-Tanks  Continuous;E-Tanks     Liters per minute  2  -  4       Home Oxygen   Home Oxygen Device  Home Concentrator;E-Tanks  Home Concentrator;E-Tanks  Home Concentrator;E-Tanks     Sleep Oxygen Prescription  Continuous  Continuous  Continuous     Liters per minute  '2  2  2     ' Home Exercise Oxygen Prescription  Continuous  Continuous  Continuous     Liters per minute  '2  2  2     ' Home at Rest Exercise Oxygen Prescription  Continuous  Continuous  Continuous     Liters per minute  '2  2  2     ' Compliance with Home Oxygen Use  Yes  -  Yes       Goals/Expected Outcomes   Short Term Goals  To learn and exhibit compliance  with exercise, home and travel O2 prescription;To learn and understand importance of monitoring SPO2 with pulse oximeter and demonstrate accurate use of the pulse oximeter.;To learn and understand importance of maintaining oxygen saturations>88%;To learn and demonstrate proper pursed lip breathing techniques or other breathing techniques.;To learn and demonstrate proper use of respiratory medications  To learn and exhibit compliance with exercise, home and travel O2 prescription;To learn and understand importance of monitoring SPO2 with pulse oximeter and demonstrate accurate use of the pulse oximeter.;To learn and understand importance of maintaining oxygen saturations>88%;To learn and demonstrate proper pursed lip  breathing techniques or other breathing techniques.;To learn and demonstrate proper use of respiratory medications  To learn and exhibit compliance with exercise, home and travel O2 prescription;To learn and understand importance of monitoring SPO2 with pulse oximeter and demonstrate accurate use of the pulse oximeter.;To learn and understand importance of maintaining oxygen saturations>88%;To learn and demonstrate proper pursed lip breathing techniques or other breathing techniques.;To learn and demonstrate proper use of respiratory medications     Long  Term Goals  Exhibits compliance with exercise, home and travel O2 prescription;Verbalizes importance of monitoring SPO2 with pulse oximeter and return demonstration;Maintenance of O2 saturations>88%;Exhibits proper breathing techniques, such as pursed lip breathing or other method taught during program session;Compliance with respiratory medication;Demonstrates proper use of MDI's  Exhibits compliance with exercise, home and travel O2 prescription;Verbalizes importance of monitoring SPO2 with pulse oximeter and return demonstration;Maintenance of O2 saturations>88%;Exhibits proper breathing techniques, such as pursed lip breathing or other method  taught during program session;Compliance with respiratory medication  Exhibits compliance with exercise, home and travel O2 prescription;Verbalizes importance of monitoring SPO2 with pulse oximeter and return demonstration;Maintenance of O2 saturations>88%;Exhibits proper breathing techniques, such as pursed lip breathing or other method taught during program session;Compliance with respiratory medication     Comments  Reviewed PLB technique with pt.  Talked about how it work and it's important to maintaining his exercise saturations.    Reviewed SPO2 readings with Bug. He verbalized understanding of >90 resting and >88 with exercise. He stated that he sees it drop to 85 when he is active sometimes so he stops and purselipped breathes to increase the level back to 90 or above.   Patient checks his oxygen at home routinely. He is trying to get used to PLB more and still has some work to do. We discussed oxygen and his workloads. His stamina has improved since the start of the program. He is taking his medications everyday and is compliant.     Goals/Expected Outcomes  Short: Become more profiecient at using PLB.   Long: Become independent at using PLB.  STG: continues to use his PLB techniques to keep O2 sat above 88%, uses his nebulizer correctly. LTG Using PLB technique without coaching when saturation drops below 88%, maintains proper use of nebulizer.    Short: continue [employer] to increase stamina and SOB. Long: be independent with PLB while walking.        Oxygen Discharge (Final Oxygen Re-Evaluation): Oxygen Re-Evaluation - 04/02/18 1043      Program Oxygen Prescription   Program Oxygen Prescription  Continuous;E-Tanks    Liters per minute  4      Home Oxygen   Home Oxygen Device  Home Concentrator;E-Tanks    Sleep Oxygen Prescription  Continuous    Liters per minute  2    Home Exercise Oxygen Prescription  Continuous    Liters per minute  2    Home at Rest Exercise Oxygen Prescription   Continuous    Liters per minute  2    Compliance with Home Oxygen Use  Yes      Goals/Expected Outcomes   Short Term Goals  To learn and exhibit compliance with exercise, home and travel O2 prescription;To learn and understand importance of monitoring SPO2 with pulse oximeter and demonstrate accurate use of the pulse oximeter.;To learn and understand importance of maintaining oxygen saturations>88%;To learn and demonstrate proper pursed lip breathing techniques or other breathing techniques.;To learn and demonstrate proper use of respiratory medications    Long  Term  Goals  Exhibits compliance with exercise, home and travel O2 prescription;Verbalizes importance of monitoring SPO2 with pulse oximeter and return demonstration;Maintenance of O2 saturations>88%;Exhibits proper breathing techniques, such as pursed lip breathing or other method taught during program session;Compliance with respiratory medication    Comments  Patient checks his oxygen at home routinely. He is trying to get used to PLB more and still has some work to do. We discussed oxygen and his workloads. His stamina has improved since the start of the program. He is taking his medications everyday and is compliant.    Goals/Expected Outcomes  Short: continue LungWorks to increase stamina and SOB. Long: be independent with PLB while walking.       Initial Exercise Prescription: Initial Exercise Prescription - 02/13/18 1100      Date of Initial Exercise RX and Referring Provider   Date  02/13/18    Referring Provider  McQuaid      Oxygen   Oxygen  Continuous    Liters  2      Treadmill   MPH  1    Grade  0    Minutes  15    METs  1.77      NuStep   Level  2    SPM  80    Minutes  15    METs  1.5      Arm Ergometer   Level  1    Minutes  15    METs  1.5      Track   Minutes  15      Prescription Details   Frequency (times per week)  3    Duration  Progress to 45 minutes of aerobic exercise without  signs/symptoms of physical distress      Intensity   THRR 40-80% of Max Heartrate  95-113    Ratings of Perceived Exertion  11-13    Perceived Dyspnea  0-4      Resistance Training   Training Prescription  Yes    Weight  2 lb    Reps  10-15       Perform Capillary Blood Glucose checks as needed.  Exercise Prescription Changes: Exercise Prescription Changes    Row Name 03/01/18 0800 03/02/18 1100 03/15/18 1000 03/28/18 1200       Response to Exercise   Blood Pressure (Admit)  132/72  -  126/64  104/54    Blood Pressure (Exit)  124/62  -  126/64  118/56    Heart Rate (Admit)  84 bpm  -  54 bpm  80 bpm    Heart Rate (Exercise)  109 bpm  -  113 bpm  109 bpm    Heart Rate (Exit)  97 bpm  -  93 bpm  97 bpm    Oxygen Saturation (Admit)  92 %  -  94 %  93 %    Oxygen Saturation (Exercise)  87 %  -  87 %  87 %    Oxygen Saturation (Exit)  93 %  -  95 %  87 %    Rating of Perceived Exertion (Exercise)  14  -  13  13    Perceived Dyspnea (Exercise)  2  -  2  1    Symptoms  none  -  none  none    Duration  Continue with 45 min of aerobic exercise without signs/symptoms of physical distress.  -  Continue with 45 min of aerobic exercise without signs/symptoms of physical  distress.  Continue with 45 min of aerobic exercise without signs/symptoms of physical distress.    Intensity  THRR unchanged  -  THRR unchanged  THRR unchanged      Progression   Progression  Continue to progress workloads to maintain intensity without signs/symptoms of physical distress.  -  Continue to progress workloads to maintain intensity without signs/symptoms of physical distress.  Continue to progress workloads to maintain intensity without signs/symptoms of physical distress.    Average METs  2  -  1.9  1.93      Resistance Training   Training Prescription  Yes  -  Yes  Yes    Weight   3 lb  -  3 lb  3 lb    Reps  10-15  -  10-15  10-15      Interval Training   Interval Training  No  -  No  No      Oxygen    Oxygen  Continuous  -  -  -    Liters  4  -  -  -      NuStep   Level  -  -  -  2    SPM  -  -  -  80    Minutes  -  -  -  15    METs  -  -  -  1.6      Arm Ergometer   Level  1  -  1  2    Minutes  15  -  15  15    METs  1.2  -  1.7  1.7      Track   Minutes  15 25 laps  -  15 24 laps  15 33 laps      Home Exercise Plan   Plans to continue exercise at  -  Home (comment)  Home (comment)  Home (comment)    Frequency  -  Add 2 additional days to program exercise sessions.  Add 2 additional days to program exercise sessions.  Add 2 additional days to program exercise sessions.    Initial Home Exercises Provided  -  03/02/18  03/02/18  03/02/18       Exercise Comments: Exercise Comments    Row Name 02/16/18 1018 03/02/18 1117         Exercise Comments  First full day of exercise!  Patient was oriented to gym and equipment including functions, settings, policies, and procedures.  Patient's individual exercise prescription and treatment plan were reviewed.  All starting workloads were established based on the results of the 6 minute walk test done at initial orientation visit.  The plan for exercise progression was also introduced and progression will be customized based on patient's performance and goals.  Reviewed home exercise with pt today.  Pt plans to walk and consider [place] for exercise.  Reviewed THR, pulse, RPE, sign and symptoms, NTG use, and when to call 911 or MD.  Also discussed weather considerations and indoor options.  Pt voiced understanding.         Exercise Goals and Review: Exercise Goals    Row Name 02/13/18 1139             Exercise Goals   Increase Physical Activity  Yes       Intervention  Provide advice, education, support and counseling about physical activity/exercise needs.;Develop an individualized exercise prescription for aerobic and resistive training based on initial evaluation  findings, risk stratification, comorbidities and  participant's personal goals.       Expected Outcomes  Short Term: Attend rehab on a regular basis to increase amount of physical activity.;Long Term: Add in home exercise to make exercise part of routine and to increase amount of physical activity.;Long Term: Exercising regularly at least 3-5 days a week.       Increase Strength and Stamina  Yes       Intervention  Provide advice, education, support and counseling about physical activity/exercise needs.;Develop an individualized exercise prescription for aerobic and resistive training based on initial evaluation findings, risk stratification, comorbidities and participant's personal goals.       Expected Outcomes  Short Term: Increase workloads from initial exercise prescription for resistance, speed, and METs.;Short Term: Perform resistance training exercises routinely during rehab and add in resistance training at home;Long Term: Improve cardiorespiratory fitness, muscular endurance and strength as measured by increased METs and functional capacity (6MWT)       Able to understand and use rate of perceived exertion (RPE) scale  Yes       Intervention  Provide education and explanation on how to use RPE scale       Expected Outcomes  Short Term: Able to use RPE daily in rehab to express subjective intensity level;Long Term:  Able to use RPE to guide intensity level when exercising independently       Able to understand and use Dyspnea scale  Yes       Intervention  Provide education and explanation on how to use Dyspnea scale       Expected Outcomes  Short Term: Able to use Dyspnea scale daily in rehab to express subjective sense of shortness of breath during exertion;Long Term: Able to use Dyspnea scale to guide intensity level when exercising independently       Knowledge and understanding of Target Heart Rate Range (THRR)  Yes       Intervention  Provide education and explanation of THRR including how the numbers were predicted and where they are  located for reference       Expected Outcomes  Short Term: Able to state/look up THRR;Short Term: Able to use daily as guideline for intensity in rehab;Long Term: Able to use THRR to govern intensity when exercising independently       Able to check pulse independently  Yes       Intervention  Provide education and demonstration on how to check pulse in carotid and radial arteries.;Review the importance of being able to check your own pulse for safety during independent exercise       Expected Outcomes  Short Term: Able to explain why pulse checking is important during independent exercise;Long Term: Able to check pulse independently and accurately       Understanding of Exercise Prescription  Yes       Intervention  Provide education, explanation, and written materials on patient's individual exercise prescription       Expected Outcomes  Short Term: Able to explain program exercise prescription;Long Term: Able to explain home exercise prescription to exercise independently          Exercise Goals Re-Evaluation : Exercise Goals Re-Evaluation    Row Name 02/16/18 1018 03/01/18 0844 03/02/18 1117 03/15/18 1030 03/28/18 1225     Exercise Goal Re-Evaluation   Exercise Goals Review  Understanding of Exercise Prescription;Able to understand and use rate of perceived exertion (RPE) scale;Able to understand and use Dyspnea scale;Knowledge and understanding of  Target Heart Rate Range (THRR)  Increase Physical Activity;Increase Strength and Stamina;Able to understand and use rate of perceived exertion (RPE) scale;Able to understand and use Dyspnea scale  Increase Physical Activity;Able to understand and use rate of perceived exertion (RPE) scale;Knowledge and understanding of Target Heart Rate Range (THRR);Understanding of Exercise Prescription;Increase Strength and Stamina;Able to understand and use Dyspnea scale  Increase Physical Activity;Increase Strength and Stamina;Able to understand and use rate of  perceived exertion (RPE) scale;Able to understand and use Dyspnea scale;Knowledge and understanding of Target Heart Rate Range (THRR)  Increase Physical Activity;Increase Strength and Stamina;Able to understand and use rate of perceived exertion (RPE) scale;Able to understand and use Dyspnea scale   Comments  Reviewed RPE scale, THR and program prescription with pt today.  Pt voiced understanding and was given a copy of goals to take home.   Bug is tolerating exercise very well.  He did 25 laps walking and has increased strength training to 3 lb.  Reviewed home exercise with pt today.  Pt plans to walk and consider [place] for exercise.  Reviewed THR, pulse, RPE, sign and symptoms, NTG use, and when to call 911 or MD.  Also discussed weather considerations and indoor options.  Pt voiced understanding.  Bug tolerates exercise well.  He walks 24-29 laps with his 02 tank most days.  He has some days he is fatigued from other activities.    Bug is progressing well with exercise.  He is practicing PLB and not open mouth breathing to help maintain 02 sats above 88.     Expected Outcomes  Short: Use RPE daily to regulate intensity. Long: Follow program prescription in THR.  Short - attend class consistently Long - increase MET level  Short - walk on days not at [employer] - join [employer] to maintain exercise  Short - continue to attend consistently Long - increase overall MET level  Short - continue to attend consistently Long - increase MET level      Discharge Exercise Prescription (Final Exercise Prescription Changes): Exercise Prescription Changes - 03/28/18 1200      Response to Exercise   Blood Pressure (Admit)  104/54    Blood Pressure (Exit)  118/56    Heart Rate (Admit)  80 bpm    Heart Rate (Exercise)  109 bpm    Heart Rate (Exit)  97 bpm    Oxygen Saturation (Admit)  93 %    Oxygen Saturation (Exercise)  87 %    Oxygen Saturation (Exit)  87 %    Rating of Perceived Exertion (Exercise)  13     Perceived Dyspnea (Exercise)  1    Symptoms  none    Duration  Continue with 45 min of aerobic exercise without signs/symptoms of physical distress.    Intensity  THRR unchanged      Progression   Progression  Continue to progress workloads to maintain intensity without signs/symptoms of physical distress.    Average METs  1.93      Resistance Training   Training Prescription  Yes    Weight  3 lb    Reps  10-15      Interval Training   Interval Training  No      NuStep   Level  2    SPM  80    Minutes  15    METs  1.6      Arm Ergometer   Level  2    Minutes  15  METs  1.7      Track   Minutes  15   33 laps     Home Exercise Plan   Plans to continue exercise at  Home (comment)    Frequency  Add 2 additional days to program exercise sessions.    Initial Home Exercises Provided  03/02/18       Nutrition:  Target Goals: Understanding of nutrition guidelines, daily intake of sodium <1548m, cholesterol <2023m calories 30% from fat and 7% or less from saturated fats, daily to have 5 or more servings of fruits and vegetables.  Biometrics: Pre Biometrics - 02/13/18 1138      Pre Biometrics   Height  6' (1.829 m)    Weight  189 lb 1.6 oz (85.8 kg)    Waist Circumference  37.75 inches    Hip Circumference  42 inches    Waist to Hip Ratio  0.9 %    BMI (Calculated)  25.64        Nutrition Therapy Plan and Nutrition Goals: Nutrition Therapy & Goals - 02/21/18 1044      Nutrition Therapy   Diet  TLC    Protein (specify units)  8oz    Fiber  30 grams    Whole Grain Foods  3 servings   does not always choose whole grains   Saturated Fats  14 max. grams    Fruits and Vegetables  6 servings/day   8 ideal; eats a variety of fruits and vegetables but does not have a large appetite   Sodium  2000 grams      Personal Nutrition Goals   Nutrition Goal  Continue to drink three 8oz Ensure nutritional drinks daily between meals. However, if you find that you are  too full to eat at meal times, consider drinking one less per day to prioritize whole food nutrition    Personal Goal #2  Maintain current diet variety and prioritize fluids daily, ideally drinking more water than sweet tea    Comments  He eats 3 meals per day and chooses fruit and occasionally ice cream as snacks. He usually has cereal and OJ for breakfast, a sandwich or salad for lunch, and his dinners are provided by his daughter. Reports appetite to have decreased over the past year. Ensure supplements are provided by the [facility] and counsel regarding individualized specific dietary modifications aiming towards targeted core components such as weight, hypertension, lipid management, diabetes, heart failure and other comorbidities.    Expected Outcomes  Short Term Goal: A plan has been developed with personal nutrition goals set during dietitian appointment.;Long Term Goal: Adherence to prescribed nutrition plan.;Short Term Goal: Understand basic principles of dietary content, such as calories, fat, sodium, cholesterol and nutrients.       Nutrition Assessments:   Nutrition Goals Re-Evaluation: Nutrition Goals Re-Evaluation    [place] 02/21/18 1053 03/19/18 1033           Goals   Nutrition Goal  Continue to drink three 8oz Ensure nutritional drinks daily between meals. However, if you find that you are too full to eat at meal times, consider drinking one less per day to prioritize whole food nutrition  Continue to drink 3 Ensure nutritional supplements daily d/t fair appetite and maintain current diet variety. Keep on a regular meal schedule (breakfast, lunch and dinner) as much as possible      Comment  He drinks 3  Ensure supplements per day as provided by the [place]. C/o decreased appetite but is able to eat 3 meals + supplements currently.  His daughter brings him lunch and suppers for the week, and he typically makes his own breakfast. He  is drinking 3 Ensure drinks daily consistently      Expected Outcome  He will prioritize nutrition from whole foods over supplement drinks, and continue to eat on a regular schedule  Continue to drink Ensure drinks 1-3x/day in addition to regular meals. Prioritize whole food nutrition        Personal Goal #2 Re-Evaluation   Personal Goal #2  Maintain current diet variety and prioritize fluids daily, ideally drinking more water than sweet tea  -         Nutrition Goals Discharge (Final Nutrition Goals Re-Evaluation): Nutrition Goals Re-Evaluation - 03/19/18 1033      Goals   Nutrition Goal  Continue to drink 3 Ensure nutritional supplements daily d/t fair appetite and maintain current diet variety. Keep on a regular meal schedule (breakfast, lunch and dinner) as much as possible    Comment  His daughter brings him lunch and suppers for the week, and he typically makes his own breakfast. He is drinking 3 Ensure drinks daily consistently    Expected Outcome  Continue to drink Ensure drinks 1-3x/day in addition to regular meals. Prioritize whole food nutrition       Psychosocial: Target Goals: Acknowledge presence or absence of significant depression and/or stress, maximize coping skills, provide positive support system. Participant is able to verbalize types and ability to use techniques and skills needed for reducing stress and depression.   Initial Review & Psychosocial Screening: Initial Psych Review & Screening - 02/13/18 1259      Initial Review   Current issues with  None Identified   Was doing yard work up to last event in July. Does miss getting out to do his own [place]?  Yes   Daughter     Barriers   Psychosocial barriers to participate in program  There are no identifiable barriers or psychosocial needs.;The patient should benefit from training in stress management and relaxation.      Screening Interventions   Interventions   Encouraged to exercise    Expected Outcomes  Short Term goal: Utilizing psychosocial counselor, staff and physician to assist with identification of specific Stressors or current issues interfering with healing process. Setting desired goal for each stressor or current issue identified.;Long Term Goal: Stressors or current issues are controlled or eliminated.;Short Term goal: Identification and review with participant of any Quality of Life or Depression concerns found by scoring the questionnaire.;Long Term goal: The participant improves quality of Life and PHQ9 Scores as seen by post scores and/or verbalization of changes       Quality of Life Scores:  Scores of 19 and below usually indicate a poorer quality of life in these areas.  A difference of  2-3 points is a clinically meaningful difference.  A difference of 2-3 points in the total score of the Quality of Life Index has been associated with significant improvement in overall quality of life, self-image, physical symptoms, and general health in studies assessing change in quality of life.  PHQ-9: Recent Review Flowsheet Data    Depression screen [facility] 2/9 02/21/2018 02/08/2018   Decreased Interest 2 0   Down, Depressed, Hopeless 1 1   PHQ - 2 Score 3 1  Altered sleeping 1 -   Tired, decreased energy 1 -   Change in appetite 2 -   Feeling bad or failure about yourself  0 -   Trouble concentrating 0 -   Moving slowly or fidgety/restless 2 -   Suicidal thoughts 0 -   PHQ-9 Score 9 -   Difficult doing work/chores Somewhat difficult -     Interpretation of Total Score  Total Score Depression Severity:  1-4 = Minimal depression, 5-9 = Mild depression, 10-14 = Moderate depression, 15-19 = Moderately severe depression, 20-27 = Severe depression   Psychosocial Evaluation and Intervention:   Psychosocial Re-Evaluation: Psychosocial Re-Evaluation    [place] Name 02/21/18 1100 04/02/18 1048           Psychosocial Re-Evaluation   Current  issues with  Current Stress Concerns  None Identified      Comments  Counselor met with Mr. Carelock "Bug" today for initial psychosocial evaluation.  He is an amazing 84 year old who has been in fairly good health most of his life.  Bug has a strong support system with a daughter who lives close by and does most of his driving for him currently; a strong faith community; and the [place] has nurses visiting and checking in on him regularly.  He reports sleeping well (approximately 8 hours) and has a "fair" appetite.  Bug denies a history of depression or anxiety or any current symptoms and he is typically in a good mood.  Counselor reviewed the PHQ-9 initial score of "13" indicating moderate symptoms of depression - but Bug changed a few of the answers about sleep and energy which decreased his score to a "9" indicating mild symptoms present and mostly related to his physical health.  But states the stress he is currently experiencing relates to being on oxygen full time and how his health and the O2 tank limit him from driving and going places he wants to whenever he wants.  He has goals for this program to get off the full time oxygen and be able to drive himself at least short distances again.  Staff will follow with Bug.  Bug states that everyting is going well and is enjoying [facility].      Expected Outcomes  Short:  Bug will exercise consistently to improve his health and manage his stress better.   Long:  Bug will hopefully be able to decrease his oxygen tank use and be able to drive short distances again.    Short: attend [facility] to decrease any stress. Long: Maintain exercise to keep stress levels at a minimum.      Interventions  -  Encouraged to attend Pulmonary Rehabilitation for the exercise      Continue Psychosocial Services   Follow up required by staff  Follow up required by staff         Psychosocial Discharge (Final Psychosocial Re-Evaluation): Psychosocial Re-Evaluation - 04/02/18 1048       Psychosocial Re-Evaluation   Current issues with  None Identified    Comments  Bug states that everyting is going well and is enjoying [facility].    Expected Outcomes  Short: attend [facility] to decrease any stress. Long: Maintain exercise to keep stress levels at a minimum.    Interventions  Encouraged to attend Pulmonary Rehabilitation for the exercise    Continue Psychosocial Services   Follow up required by staff       Education: Education Goals: Education classes will be provided on a weekly basis,  covering required topics. Participant will state understanding/return demonstration of topics presented.  Learning Barriers/Preferences: Learning Barriers/Preferences - 02/13/18 1300      Learning Barriers/Preferences   Learning Barriers  Hearing   Wears hearing aids.  does not always were them   Learning Preferences  None       Education Topics:  Initial Evaluation Education: - Verbal, written and demonstration of respiratory meds, oximetry and breathing techniques. Instruction on use of nebulizers and MDIs and importance of monitoring MDI activations.   Pulmonary Rehab from 04/04/2018 in [facility] Cardiac and Pulmonary Rehab  Date  02/13/18  Educator  SB  Instruction Review Code  1- Verbalizes Understanding      General Nutrition Guidelines/Fats and Fiber: -Group instruction provided by verbal, written material, models and posters to present the general guidelines for heart healthy nutrition. Gives an explanation and review of dietary fats and fiber.   Pulmonary Rehab from 04/04/2018 in [facility] Cardiac and Pulmonary Rehab  Date  02/28/18  Educator  LB  Instruction Review Code  1- Verbalizes Understanding      Controlling Sodium/Reading Food Labels: -Group verbal and written material supporting the discussion of sodium use in heart healthy nutrition. Review and explanation with models, verbal and written materials for utilization of the food label.   Pulmonary Rehab from  04/04/2018 in [facility] Cardiac and Pulmonary Rehab  Date  03/07/18  Educator  LB  Instruction Review Code  1- Verbalizes Understanding      Exercise Physiology & General Exercise Guidelines: - Group verbal and written instruction with models to review the exercise physiology of the cardiovascular system and associated critical values. Provides general exercise guidelines with specific guidelines to those with heart or lung disease.    Pulmonary Rehab from 04/04/2018 in [facility] Cardiac and Pulmonary Rehab  Date  03/14/18  Educator  [facility]  Instruction Review Code  1- Verbalizes Understanding      Aerobic Exercise & Resistance Training: - Gives group verbal and written instruction on the various components of exercise. Focuses on aerobic and resistive training programs and the benefits of this training and how to safely progress through these programs.   Flexibility, Balance, Mind/Body Relaxation: Provides group verbal/written instruction on the benefits of flexibility and balance training, including mind/body exercise modes such as yoga, pilates and tai chi.  Demonstration and skill practice provided.   Stress and Anxiety: - Provides group verbal and written instruction about the health risks of elevated stress and causes of high stress.  Discuss the correlation between heart/lung disease and anxiety and treatment options. Review healthy ways to manage with stress and anxiety.   Depression: - Provides group verbal and written instruction on the correlation between heart/lung disease and depressed mood, treatment options, and the stigmas associated with seeking treatment.   Pulmonary Rehab from 04/04/2018 in [facility] Cardiac and Pulmonary Rehab  Date  04/04/18  Educator  [facility]  Instruction Review Code  1- Verbalizes Understanding      Exercise & Equipment Safety: - Individual verbal instruction and demonstration of equipment use and safety with use of the equipment.   Pulmonary Rehab from  04/04/2018 in [facility] Cardiac and Pulmonary Rehab  Date  02/13/18  Educator  sB  Instruction Review Code  1- Verbalizes Understanding      Infection Prevention: - Provides verbal and written material to individual with discussion of infection control including proper hand washing and proper equipment cleaning during exercise session.   Pulmonary Rehab from 04/04/2018 in [facility] Cardiac and Pulmonary  Rehab  Date  02/13/18  Educator  SB1  Instruction Review Code  1- Verbalizes Understanding      Falls Prevention: - Provides verbal and written material to individual with discussion of falls prevention and safety.   Pulmonary Rehab from 04/04/2018 in [facility] Cardiac and Pulmonary Rehab  Date  02/13/18  Educator  SB  Instruction Review Code  1- Verbalizes Understanding      Diabetes: - Individual verbal and written instruction to review signs/symptoms of diabetes, desired ranges of glucose level fasting, after meals and with exercise. Advice that pre and post exercise glucose checks will be done for 3 sessions at entry of program.   Chronic Lung Diseases: - Group verbal and written instruction to review updates, respiratory medications, advancements in procedures and treatments. Discuss use of supplemental oxygen including available portable oxygen systems, continuous and intermittent flow rates, concentrators, personal use and safety guidelines. Review proper use of inhaler and spacers. Provide informative websites for self-education.    Pulmonary Rehab from 04/04/2018 in [facility] Cardiac and Pulmonary Rehab  Date  03/02/18  Educator  [facility]  Instruction Review Code  1- Verbalizes Understanding      Energy Conservation: - Provide group verbal and written instruction for methods to conserve energy, plan and organize activities. Instruct on pacing techniques, use of adaptive equipment and posture/positioning to relieve shortness of breath.   Triggers and Exacerbations: - Group verbal and written  instruction to review types of environmental triggers and ways to prevent exacerbations. Discuss weather changes, air quality and the benefits of nasal washing. Review warning signs and symptoms to help prevent infections. Discuss techniques for effective airway clearance, coughing, and vibrations.   Pulmonary Rehab from 04/04/2018 in [facility] Cardiac and Pulmonary Rehab  Date  03/21/18  Educator  [facility]  Instruction Review Code  1- Verbalizes Understanding      AED/CPR: - Group verbal and written instruction with the use of models to demonstrate the basic use of the AED with the basic ABC's of resuscitation.   Anatomy and Physiology of the Lungs: - Group verbal and written instruction with the use of models to provide basic lung anatomy and physiology related to function, structure and complications of lung disease.   Anatomy & Physiology of the Heart: - Group verbal and written instruction and models provide basic cardiac anatomy and physiology, with the coronary electrical and arterial systems. Review of Valvular disease and Heart Failure   Pulmonary Rehab from 04/04/2018 in [facility] Cardiac and Pulmonary Rehab  Date  03/30/18  Educator  [facility]  Instruction Review Code  1- Verbalizes Understanding      Cardiac Medications: - Group verbal and written instruction to review commonly prescribed medications for heart disease. Reviews the medication, class of the drug, and side effects.   Know Your Numbers and Risk Factors: -Group verbal and written instruction about important numbers in your health.  Discussion of what are risk factors and how they play a role in the disease process.  Review of Cholesterol, Blood Pressure, Diabetes, and BMI and the role they play in your overall health.   Pulmonary Rehab from 04/04/2018 in [facility] Cardiac and Pulmonary Rehab  Date  03/16/18  Educator  [facility]  Instruction Review Code  1- Verbalizes Understanding      Sleep Hygiene: -Provides group verbal and written  instruction about how sleep can affect your health.  Define sleep hygiene, discuss sleep cycles and impact of sleep habits. Review good sleep hygiene tips.    Pulmonary Rehab  from 04/04/2018 in [facility] Cardiac and Pulmonary Rehab  Date  02/21/18  Educator  [facility]  Instruction Review Code  1- Verbalizes Understanding      Other: -Provides group and verbal instruction on various topics (see comments)    Knowledge Questionnaire Score: Knowledge Questionnaire Score - 02/13/18 1301      Knowledge Questionnaire Score   Pre Score  13/18   Correct responses reviewed with Bug today. He verbalized understanding and had no further questions today       Core Components/Risk Factors/Patient Goals at Admission: Personal Goals and Risk Factors at Admission - 02/13/18 1301      Core Components/Risk Factors/Patient Goals on Admission   Improve shortness of breath with ADL's  Yes    Intervention  Provide education, individualized exercise plan and daily activity instruction to help decrease symptoms of SOB with activities of daily living.    Expected Outcomes  Short Term: Improve cardiorespiratory fitness to achieve a reduction of symptoms when performing ADLs;Long Term: Be able to perform more ADLs without symptoms or delay the onset of symptoms    Hypertension  Yes    Intervention  Provide education on lifestyle modifcations including regular physical activity/exercise, weight management, moderate sodium restriction and increased consumption of fresh fruit, vegetables, and low fat dairy, alcohol moderation, and smoking cessation.;Monitor prescription use compliance.    Expected Outcomes  Short Term: Continued assessment and intervention until BP is < 140/58m HG in hypertensive participants. < 130/824mHG in hypertensive participants with diabetes, heart failure or chronic kidney disease.;Long Term: Maintenance of blood pressure at goal levels.    Lipids  Yes    Intervention  Provide education and support  for participant on nutrition & aerobic/resistive exercise along with prescribed medications to achieve LDL <7066mHDL >87m36m  Expected Outcomes  Short Term: Participant states understanding of desired cholesterol values and is compliant with medications prescribed. Participant is following exercise prescription and nutrition guidelines.;Long Term: Cholesterol controlled with medications as prescribed, with individualized exercise RX and with personalized nutrition plan. Value goals: LDL < 70mg61mL > 40 mg.       Core Components/Risk Factors/Patient Goals Review:  Goals and Risk Factor Review    Row Name 02/28/18 1058 04/02/18 1050           Core Components/Risk Factors/Patient Goals Review   Personal Goals Review  Improve shortness of breath with ADL's;Lipids;Hypertension  Improve shortness of breath with ADL's      Review  Talked with Bug today about his goals. He does not have SOB except with extended walks.  He does well walking down his driveway and street short distance. He is concerned that he is not walking more laps each day"my legs get tired"". Reviewed that he will be here 12 weeks nad has time to strengthen his muscles and improve his walking distance. He verbalized understanding. No concerns with HTN and Cholesterol today.   Patient has improved with his shortness of breath. [place].  He is able to get around his house a little better without getting too short of breath.      Expected Outcomes  STG: Bug continues with exercise prescription. LTG Bug sees improvement in walking distance without increased SOB  Short: attned class to further improve ADL's. Long: Graduate [facility] and continue to exercise.         Core Components/Risk Factors/Patient Goals at Discharge (Final Review):  Goals and  Risk Factor Review - 04/02/18 1050      Core Components/Risk Factors/Patient Goals Review   Personal Goals Review   Improve shortness of breath with ADL's    Review  Patient has improved with his shortness of breath. He has regular attendance in [place] and is able to walk further than before.  He is able to get around his house a little better without getting too short of breath.    Expected Outcomes  Short: attned class to further improve ADL's. Long: Graduate [facility] and continue to exercise.       ITP Comments: ITP Comments    Row Name 02/13/18 1247 03/12/18 0826 04/09/18 0844       ITP Comments  Bug arrived, completed his 6 min walk and at that point we found out he was still receiving [place]. Could not complete the Medical review.  WIll use paperwork to complete ITP evaluation assessments and send to Dr Sabra Heck for review, changes as needed and signature.   Documentation of diagnosis can be found in [facility] 01/25/2018  30 day review completed. ITP sent to Dr. Emily Filbert Director of [place]. Continue with ITP unless changes are made by physician  30 day review completed. ITP sent to Dr. Emily Filbert Director of [place]. Continue with ITP unless changes are made by physician.        Comments: 30 day review

## 2018-04-09 NOTE — Progress Notes (Signed)
Daily Session Note  Patient Details  Name: Paul Cross MRN: [id number] Date of Birth: 06-22-1917 Referring Provider:     Pulmonary Rehab from 02/13/2018 in [facility] Cardiac and Pulmonary Rehab  Referring Provider  [place]      Encounter Date: 04/09/2018  Check In: Session Check In - 04/09/18 1019      Check-In   Supervising physician immediately available to respond to emergencies  [facility] immediately available ER MD    Physician(s)  Dr. Joni Fears and Dr. Corky Downs    Location  [facility]-Cardiac & Pulmonary Rehab    Staff Present  Nada Maclachlan, BA, ACSM CEP, Exercise Physiologist;Joseph [facility] [place], [place], ACSM CEP, Exercise Physiologist    Medication changes reported      No    Fall or balance concerns reported     No    Tobacco Cessation  No Change    Warm-up and Cool-down  Performed as group-led instruction    Resistance Training Performed  Yes    VAD Patient?  No    PAD/SET Patient?  No      Pain Assessment   Currently in Pain?  No/denies    Multiple Pain Sites  No          Social History   Tobacco Use  Smoking Status Former Smoker  Smokeless Tobacco Former Environmental health practitioner Met:  Proper associated with RPD/PD & O2 Sat Independence with exercise equipment Exercise tolerated well No report of cardiac concerns or symptoms Strength training completed today  Goals Unmet:  Not Applicable  Comments: Pt able to follow exercise prescription today without complaint.  Will continue to monitor for progression.    Dr. Emily Filbert is Medical Director for [place] and [facility]monary [facility].

## 2018-04-11 DIAGNOSIS — J849 Interstitial pulmonary disease, unspecified: Secondary | ICD-10-CM

## 2018-04-11 NOTE — Progress Notes (Signed)
Daily Session Note  Patient Details  Name: Paul Cross MRN: [id number] Date of Birth: 03/01/1918 Referring Provider:     Pulmonary Rehab from 02/13/2018 in [facility] Cardiac and Pulmonary Rehab  Referring Provider  McQuaid      Encounter Date: 04/11/2018  Check In: Session Check In - 04/11/18 [place]      Check-In   Supervising physician immediately available to respond to emergencies  [facility] immediately available ER MD    Physician(s)  Dr. Joni Fears and Darl Householder    Location  [facility]-Cardiac & Pulmonary Rehab    Staff Present  Alberteen Sam, MA, RCEP, CCRP, Exercise Physiologist;Ersa Delaney [facility], [place], ACSM CEP, Exercise Physiologist    Medication changes reported      No    Fall or balance concerns reported     No    Warm-up and Cool-down  Performed as group-led instruction    Resistance Training Performed  Yes    VAD Patient?  No    PAD/SET Patient?  No      Pain Assessment   Currently in Pain?  No/denies          Social History   Tobacco Use  Smoking Status Former Smoker  Smokeless Tobacco Former Environmental health practitioner Met:  Independence with exercise equipment Exercise tolerated well No report of cardiac concerns or symptoms Strength training completed today  Goals Unmet:  Not Applicable  Comments: Pt able to follow exercise prescription today without complaint.  Will continue to monitor for progression.   Dr. Emily Filbert is Medical Director for [place] [employer] and [facility].

## 2018-04-12 ENCOUNTER — Encounter: Payer: Self-pay | Admitting: Family Medicine

## 2018-04-16 DIAGNOSIS — J849 Interstitial pulmonary disease, unspecified: Secondary | ICD-10-CM | POA: Diagnosis not present

## 2018-04-16 NOTE — Progress Notes (Signed)
Daily Session Note  Patient Details  Name: Paul Cross MRN: [id number] Date of Birth: 29-Feb-1918 Referring Provider:     Pulmonary Rehab from 02/13/2018 in [facility] Cardiac and Pulmonary Rehab  Referring Provider  McQuaid      Encounter Date: 04/16/2018  Check In: Session Check In - 04/16/18 0948      Check-In   Supervising physician immediately available to respond to emergencies  [facility] immediately available ER MD    Physician(s)  Dr. Jimmye Norman and [facility]    Location  [facility]-Cardiac & Pulmonary Rehab    Staff Present  Justin Mend RCP,RRT,BSRT;Amanda Oletta Darter, [place], ACSM CEP, Exercise Physiologist;Kelly Amedeo Plenty, BS, ACSM CEP, Exercise Physiologist    Medication changes reported      No    Fall or balance concerns reported     No    Warm-up and Cool-down  Performed as group-led instruction    Resistance Training Performed  Yes    VAD Patient?  No    PAD/SET Patient?  No      Pain Assessment   Currently in Pain?  No/denies          Social History   Tobacco Use  Smoking Status Former Smoker  Smokeless Tobacco Former Environmental health practitioner Met:  Independence with exercise equipment Exercise tolerated well No report of cardiac concerns or symptoms Strength training completed today  Goals Unmet:  Not Applicable  Comments: Pt able to follow exercise prescription today without complaint.  Will continue to monitor for progression.    Dr. Emily Filbert is Medical Director for [place] and [facility].

## 2018-04-18 ENCOUNTER — Encounter: Payer: Medicare Other | Admitting: *Deleted

## 2018-04-18 DIAGNOSIS — J849 Interstitial pulmonary disease, unspecified: Secondary | ICD-10-CM

## 2018-04-18 NOTE — Progress Notes (Signed)
Daily Session Note  Patient Details  Name: Paul Cross MRN: [id number] Date of Birth: August 23, 1917 Referring Provider:     Pulmonary Rehab from 02/13/2018 in [facility] Cardiac and Pulmonary Rehab  Referring Provider  McQuaid      Encounter Date: 04/18/2018  Check In: Session Check In - 04/18/18 1054      Check-In   Supervising physician immediately available to respond to emergencies  [facility] immediately available ER MD    Physician(s)  Drs. Lord and [employer]    Location  [facility]-Cardiac & Pulmonary Rehab    Staff Present  Alberteen Sam, MA, RCEP, CCRP, Exercise Physiologist;Joseph [employer], [place], ACSM CEP, Exercise Physiologist    Medication changes reported      No    Fall or balance concerns reported     No    Warm-up and Cool-down  Performed as group-led instruction    Resistance Training Performed  Yes    VAD Patient?  No    PAD/SET Patient?  No      Pain Assessment   Currently in Pain?  No/denies          Social History   Tobacco Use  Smoking Status Former Smoker  Smokeless Tobacco Former Environmental health practitioner Met:  Proper associated with RPD/PD & O2 Sat Independence with exercise equipment Using PLB without cueing & demonstrates good technique Exercise tolerated well No report of cardiac concerns or symptoms Strength training completed today  Goals Unmet:  Not Applicable  Comments: Pt able to follow exercise prescription today without complaint.  Will continue to monitor for progression.    Dr. Emily Filbert is Medical Director for [place] and [facility].

## 2018-04-20 ENCOUNTER — Encounter: Payer: Medicare Other | Attending: Pulmonary Disease

## 2018-04-20 DIAGNOSIS — Z87891 Personal history of nicotine dependence: Secondary | ICD-10-CM | POA: Insufficient documentation

## 2018-04-20 DIAGNOSIS — Z7982 Long term (current) use of aspirin: Secondary | ICD-10-CM | POA: Diagnosis not present

## 2018-04-20 DIAGNOSIS — Z79899 Other long term (current) drug therapy: Secondary | ICD-10-CM | POA: Diagnosis not present

## 2018-04-20 DIAGNOSIS — E785 Hyperlipidemia, unspecified: Secondary | ICD-10-CM | POA: Insufficient documentation

## 2018-04-20 DIAGNOSIS — J849 Interstitial pulmonary disease, unspecified: Secondary | ICD-10-CM | POA: Diagnosis present

## 2018-04-20 DIAGNOSIS — Z79890 Hormone replacement therapy (postmenopausal): Secondary | ICD-10-CM | POA: Insufficient documentation

## 2018-04-20 DIAGNOSIS — E039 Hypothyroidism, unspecified: Secondary | ICD-10-CM | POA: Diagnosis not present

## 2018-04-20 DIAGNOSIS — I10 Essential (primary) hypertension: Secondary | ICD-10-CM | POA: Diagnosis not present

## 2018-04-20 NOTE — Progress Notes (Signed)
Daily Session Note  Patient Details  Name: Paul Cross MRN: [id number] Date of Birth: 1918/05/19 Referring Provider:     Pulmonary Rehab from 02/13/2018 in [facility] Cardiac and Pulmonary Rehab  Referring Provider  McQuaid      Encounter Date: 04/20/2018  Check In: Session Check In - 04/20/18 1000      Check-In   Supervising physician immediately available to respond to emergencies  [facility] immediately available ER MD    Physician(s)  Dr. Cherylann Banas    Location  [facility]-Cardiac & Pulmonary Rehab    Staff Present  Justin Mend RCP,RRT,BSRT;Meredith Sherryll Burger, RN BSN;Jessica Luan Pulling, MA, RCEP, CCRP, Exercise Physiologist    Medication changes reported      No    Fall or balance concerns reported     No    Warm-up and Cool-down  Performed as group-led instruction    Resistance Training Performed  Yes    VAD Patient?  No    PAD/SET Patient?  No      Pain Assessment   Currently in Pain?  No/denies          Social History   Tobacco Use  Smoking Status Former Smoker  Smokeless Tobacco Former Environmental health practitioner Met:  Independence with exercise equipment Exercise tolerated well No report of cardiac concerns or symptoms Strength training completed today  Goals Unmet:  Not Applicable  Comments: Pt able to follow exercise prescription today without complaint.  Will continue to monitor for progression.    Dr. Emily Filbert is Medical Director for [place] and [facility].

## 2018-04-23 DIAGNOSIS — J849 Interstitial pulmonary disease, unspecified: Secondary | ICD-10-CM

## 2018-04-23 NOTE — Progress Notes (Signed)
Daily Session Note  Patient Details  Name: Paul Cross MRN: [id number] Date of Birth: 1917/09/22 Referring Provider:     Pulmonary Rehab from 02/13/2018 in [facility] Cardiac and Pulmonary Rehab  Referring Provider  McQuaid      Encounter Date: 04/23/2018  Check In: Session Check In - 04/23/18 [place]      Check-In   Supervising physician immediately available to respond to emergencies  [facility] immediately available ER MD    Physician(s)  Dr. Corky Downs and [facility]    Location  [facility]-Cardiac & Pulmonary Rehab    Staff Present  Justin Mend RCP,RRT,BSRT;Kelly Amedeo Plenty, BS, ACSM CEP, Exercise Physiologist    Medication changes reported      No    Fall or balance concerns reported     No    Warm-up and Cool-down  Performed as group-led instruction    Resistance Training Performed  Yes    VAD Patient?  No    PAD/SET Patient?  No      Pain Assessment   Currently in Pain?  No/denies          Social History   Tobacco Use  Smoking Status Former Smoker  Smokeless Tobacco Former Environmental health practitioner Met:  Independence with exercise equipment Exercise tolerated well No report of cardiac concerns or symptoms Strength training completed today  Goals Unmet:  Not Applicable  Comments: Pt able to follow exercise prescription today without complaint.  Will continue to monitor for progression.    Dr. Emily Filbert is Medical Director for [place] and [facility].

## 2018-04-25 DIAGNOSIS — J849 Interstitial pulmonary disease, unspecified: Secondary | ICD-10-CM

## 2018-04-25 NOTE — Progress Notes (Signed)
Daily Session Note  Patient Details  Name: Paul Cross MRN: [id number] Date of Birth: 1918-02-13 Referring Provider:     Pulmonary Rehab from 02/13/2018 in [facility] Cardiac and Pulmonary Rehab  Referring Provider  McQuaid      Encounter Date: 04/25/2018  Check In: Session Check In - 04/25/18 1028      Check-In   Supervising physician immediately available to respond to emergencies  [facility] immediately available ER MD    Physician(s)  Short: Use RPE daily to regulate intensity. Long: Follow program prescription in [facility].    Location  [facility]-Cardiac & Pulmonary Rehab    Staff Present  Alberteen Sam, MA, RCEP, CCRP, Exercise Physiologist;Joseph Foy [place], [place], ACSM CEP, Exercise Physiologist    Medication changes reported      No    Fall or balance concerns reported     No    Warm-up and Cool-down  Performed as group-led instruction    Resistance Training Performed  Yes    VAD Patient?  No    PAD/SET Patient?  No      Pain Assessment   Currently in Pain?  No/denies    Multiple Pain Sites  No          Social History   Tobacco Use  Smoking Status Former Smoker  Smokeless Tobacco Former Systems developer    Goals Met:  Proper associated with RPD/PD & O2 Sat Independence with exercise equipment Exercise tolerated well Strength training completed today  Goals Unmet:  Not Applicable  Comments: Pt able to follow exercise prescription today without complaint.  Will continue to monitor for progression.    Dr. Emily Filbert is Medical Director for [place] and [facility].

## 2018-04-27 DIAGNOSIS — J849 Interstitial pulmonary disease, unspecified: Secondary | ICD-10-CM | POA: Diagnosis not present

## 2018-04-27 NOTE — Progress Notes (Signed)
Daily Session Note  Patient Details  Name: Paul Cross MRN: [id number] Date of Birth: [birth year] Referring Provider:     Pulmonary Rehab from 02/13/2018 in [facility] Cardiac and Pulmonary Rehab  Referring Provider  McQuaid      Encounter Date: 04/27/2018  Check In: Session Check In - 04/27/18 0946      Check-In   Supervising physician immediately available to respond to emergencies  [facility] immediately available ER MD    Physician(s)  Dr. Cinda Quest and Corky Downs    Location  [facility]-Cardiac & Pulmonary Rehab    Staff Present  Justin Mend RCP,RRT,BSRT;Meredith Sherryll Burger, RN Vickki Hearing, BA, ACSM CEP, Exercise Physiologist    Medication changes reported      No    Fall or balance concerns reported     No    Warm-up and Cool-down  Performed as group-led instruction    Resistance Training Performed  Yes    VAD Patient?  No    PAD/SET Patient?  No      Pain Assessment   Currently in Pain?  No/denies          Social History   Tobacco Use  Smoking Status Former Smoker  Smokeless Tobacco Former Environmental health practitioner Met:  Independence with exercise equipment Exercise tolerated well No report of cardiac concerns or symptoms Strength training completed today  Goals Unmet:  Not Applicable  Comments: Pt able to follow exercise prescription today without complaint.  Will continue to monitor for progression.    Dr. Emily Filbert is Medical Director for [place] and [facility].

## 2018-04-30 DIAGNOSIS — J849 Interstitial pulmonary disease, unspecified: Secondary | ICD-10-CM

## 2018-04-30 NOTE — Progress Notes (Signed)
Daily Session Note  Patient Details  Name: ZEVIN NEVARES MRN: [id number] Date of Birth: [birth year] Referring Provider:     Pulmonary Rehab from 02/13/2018 in [facility] Cardiac and Pulmonary Rehab  Referring Provider  McQuaid      Encounter Date: 04/30/2018  Check In: Session Check In - 04/30/18 0948      Check-In   Supervising physician immediately available to respond to emergencies  [facility] immediately available ER MD    Physician(s)  Dr. Jacqualine Code and Jimmye Norman    Location  [facility]-Cardiac & Pulmonary Rehab    Staff Present  Justin Mend Jaci Carrel, BS, ACSM CEP, Exercise Physiologist;Carroll Enterkin, RN, BSN    Medication changes reported      No    Fall or balance concerns reported     No    Warm-up and Cool-down  Performed as group-led instruction    Resistance Training Performed  Yes    VAD Patient?  No    PAD/SET Patient?  No      Pain Assessment   Currently in Pain?  No/denies          Social History   Tobacco Use  Smoking Status Former Smoker  Smokeless Tobacco Former Environmental health practitioner Met:  Independence with exercise equipment Exercise tolerated well Personal goals reviewed No report of cardiac concerns or symptoms Strength training completed today  Goals Unmet:  Not Applicable  Comments: Pt able to follow exercise prescription today without complaint.  Will continue to monitor for progression.    Dr. Emily Filbert is Medical Director for [place] and [facility].

## 2018-05-02 DIAGNOSIS — J849 Interstitial pulmonary disease, unspecified: Secondary | ICD-10-CM | POA: Diagnosis not present

## 2018-05-02 NOTE — Progress Notes (Signed)
Daily Session Note  Patient Details  Name: Paul Cross MRN: [id number] Date of Birth: 1918/01/31 Referring Provider:     Pulmonary Rehab from 02/13/2018 in [facility] Cardiac and Pulmonary Rehab  Referring Provider  McQuaid      Encounter Date: 05/02/2018  Check In: Session Check In - 05/02/18 [place]      Check-In   Supervising physician immediately available to respond to emergencies  [facility] immediately available ER MD    Physician(s)  Dr. Reita Cliche and Darl Householder    Location  [facility]-Cardiac & Pulmonary Rehab    Staff Present  Justin Mend [place] Nick, MA, RCEP, CCRP, Exercise Physiologist;Amanda Oletta Darter, [place], ACSM CEP, Exercise Physiologist    Medication changes reported      No    Fall or balance concerns reported     No    Warm-up and Cool-down  Performed as group-led instruction    Resistance Training Performed  Yes    VAD Patient?  No    PAD/SET Patient?  No      Pain Assessment   Currently in Pain?  No/denies          Social History   Tobacco Use  Smoking Status Former Smoker  Smokeless Tobacco Former Environmental health practitioner Met:  Independence with exercise equipment Exercise tolerated well No report of cardiac concerns or symptoms Strength training completed today  Goals Unmet:  Not Applicable  Comments: Pt able to follow exercise prescription today without complaint.  Will continue to monitor for progression.    Dr. Emily Filbert is Medical Director for [place] and [facility].

## 2018-05-04 DIAGNOSIS — J849 Interstitial pulmonary disease, unspecified: Secondary | ICD-10-CM | POA: Diagnosis not present

## 2018-05-04 NOTE — Progress Notes (Signed)
Daily Session Note  Patient Details  Name: Paul Cross MRN: [id number] Date of Birth: [birth year] Referring Provider:     Pulmonary Rehab from 02/13/2018 in [facility] Cardiac and Pulmonary Rehab  Referring Provider  McQuaid      Encounter Date: 05/04/2018  Check In: Session Check In - 05/04/18 0950      Check-In   Supervising physician immediately available to respond to emergencies  [facility] immediately available ER MD    Physician(s)  Dr. Reita Cliche and Burlene Arnt    Location  [facility]-Cardiac & Pulmonary Rehab    Staff Present  Alberteen Sam, MA, RCEP, CCRP, Exercise Physiologist;Joseph [facility], [place], ACSM CEP, Exercise Physiologist    Medication changes reported      No    Fall or balance concerns reported     No    Warm-up and Cool-down  Performed as group-led instruction    Resistance Training Performed  Yes    VAD Patient?  No    PAD/SET Patient?  No      Pain Assessment   Currently in Pain?  No/denies          Social History   Tobacco Use  Smoking Status Former Smoker  Smokeless Tobacco Former Environmental health practitioner Met:  Independence with exercise equipment Exercise tolerated well No report of cardiac concerns or symptoms Strength training completed today  Goals Unmet:  Not Applicable  Comments: Pt able to follow exercise prescription today without complaint.  Will continue to monitor for progression.    Dr. Emily Filbert is Medical Director for [place] and [facility].

## 2018-05-07 ENCOUNTER — Encounter: Payer: Medicare Other | Admitting: *Deleted

## 2018-05-07 VITALS — Ht 72.0 in | Wt 192.0 lb

## 2018-05-07 DIAGNOSIS — J849 Interstitial pulmonary disease, unspecified: Secondary | ICD-10-CM

## 2018-05-07 NOTE — Progress Notes (Signed)
Daily Session Note  Patient Details  Name: Paul Cross MRN: [id number] Date of Birth: [birth year] Referring Provider:     Pulmonary Rehab from 02/13/2018 in [facility] Cardiac and Pulmonary Rehab  Referring Provider  McQuaid      Encounter Date: 05/07/2018  Check In: Session Check In - 05/07/18 1014      Check-In   Supervising physician immediately available to respond to emergencies  [facility] immediately available ER MD    Physician(s)  Dr. Mariea Clonts and Dr. Cherylann Banas    Location  [facility]-Cardiac & Pulmonary Rehab    Staff Present  Nada Maclachlan, BA, ACSM CEP, Exercise Physiologist;Joseph [facility] [place], [place], ACSM CEP, Exercise Physiologist    Medication changes reported      No    Fall or balance concerns reported     No    Tobacco Cessation  No Change    Warm-up and Cool-down  Performed as group-led instruction    Resistance Training Performed  Yes    VAD Patient?  No    PAD/SET Patient?  No      Pain Assessment   Currently in Pain?  No/denies    Multiple Pain Sites  No          Social History   Tobacco Use  Smoking Status Former Smoker  Smokeless Tobacco Former Environmental health practitioner Met:  Proper associated with RPD/PD & O2 Sat Independence with exercise equipment Exercise tolerated well No report of cardiac concerns or symptoms Strength training completed today  Goals Unmet:  Not Applicable  Comments: Pt able to follow exercise prescription today without complaint.  Will continue to monitor for progression.    Dr. Emily Filbert is Medical Director for [place] and [facility].

## 2018-05-07 NOTE — Progress Notes (Signed)
Pulmonary Individual Treatment Plan  Patient Details  Name: LYRICK LAGRAND MRN: [id number] Date of Birth: 1918/01/10 Referring Provider:     Pulmonary Rehab from 02/13/2018 in [facility] Re[facility]  Referring Provider  McQuaid      Initial Encounter Date:    Pulmonary Rehab from 02/13/2018 in [facility] Cardiac and Pulmonary Rehab  Date  02/13/18      Visit Diagnosis: ILD (interstitial lung disease) ([place])  Patient's Home Medications on Admission:  Current Outpatient Medications:  .  acetaminophen (TYLENOL) 325 MG tablet, Take 975 mg by mouth 3 (three) times daily as needed for mild pain, moderate pain, fever or headache., Disp: , Rfl:  .  amLODipine (NORVASC) 5 MG tablet, Take 5 mg by mouth daily., Disp: , Rfl:  .  aspirin EC 81 MG tablet, Take 81 mg by mouth daily., Disp: , Rfl:  .  atorvastatin (LIPITOR) 40 MG tablet, Take 20 mg by mouth at bedtime., Disp: , Rfl:  .  budesonide-formoterol (SYMBICORT) 160-4.5 MCG/ACT inhaler, Inhale 2 puffs into the lungs 2 (two) times daily., Disp: , Rfl:  .  cholecalciferol (VITAMIN D) 1000 units tablet, Take 1,000 Units by mouth daily., Disp: , Rfl:  .  hydrocortisone cream 1 %, Apply 1 application topically 2 (two) times daily. FOR TAILBONE, Disp: , Rfl:  .  ipratropium-albuterol (DUONEB) 0.5-2.5 (3) MG/3ML SOLN, Take 3 mLs by nebulization 3 (three) times daily., Disp: 360 mL, Rfl: 3 .  lactulose (CEPHULAC) 10 g packet, Take 1 packet (10 g total) by mouth at bedtime., Disp: 30 each, Rfl: 0 .  levothyroxine (SYNTHROID, LEVOTHROID) 137 MCG tablet, Take 137 mcg by mouth daily before breakfast. , Disp: , Rfl:  .  Multiple Vitamin (MULTIVITAMIN) capsule, Take 1 capsule by mouth daily., Disp: , Rfl:  .  tamsulosin (FLOMAX) 0.4 MG CAPS capsule, Take 0.4 mg by mouth daily., Disp: , Rfl:  .  tiotropium (SPIRIVA HANDIHALER) 18 MCG inhalation capsule, Place 1 capsule (18 mcg total) into inhaler and inhale daily., Disp: 30 capsule, Rfl: 11 .   Tiotropium Bromide Monohydrate (SPIRIVA RESPIMAT) 2.5 MCG/ACT AERS, Inhale 2 puffs into the lungs daily., Disp: 1 Inhaler, Rfl: 11 .  vitamin B-12 (CYANOCOBALAMIN) 1000 MCG tablet, Take 1,000 mcg by mouth daily., Disp: , Rfl:  No current facility-administered medications for this visit.   Facility-Administered Medications Ordered in Other Visits:  .  albuterol (PROVENTIL) (2.5 MG/3ML) 0.083% nebulizer solution 2.5 mg, 2.5 mg, Nebulization, Once, Juanito Doom, MD  Past Medical History: Past Medical History:  Diagnosis Date  . Hyperlipidemia   . Hypertension   . 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 [employer] Cardiac and Pulmonary Rehab Latest Ref Rng & Units 01/01/2018 02/08/2018   Hemoglobin A1c 4.6 - 6.5 % - 5.9   PHART 7.350 - 7.450 7.45 -   PCO2ART 32.0 - 48.0 mmHg 36 -   HCO3 20.0 - 28.0 mmol/L 25.0 -   O2SAT % 89.7 -       Pulmonary Assessment Scores: Pulmonary Assessment Scores    Row Name 02/13/18 1256 02/16/18 1216 05/02/18 1156     ADL UCSD   ADL Phase  Entry  Entry  Exit   SOB Score total  79  -  38   Rest  3  -  0   Walk  5  -  3   Stairs  4  -  1   [place]  3  -  4   Dress  3  -  2   Shop  4  -  0     CAT Score   CAT Score  18  -  15     mMRC Score   mMRC Score  -  1  -      Pulmonary Function Assessment: Pulmonary Function Assessment - 02/13/18 1255      Breath   Shortness of Breath  Yes;Limiting activity       Exercise Target Goals: Exercise Program Goal: Individual exercise prescription set using results from initial 6 min walk test and THRR while considering  patient's activity barriers and safety.   Exercise Prescription Goal: Initial exercise prescription builds to 30-45 minutes a day of aerobic activity, 2-3 days per week.  Home exercise guidelines will be given to patient during  program as part of exercise prescription that the participant will acknowledge.  Activity Barriers & Risk Stratification: Activity Barriers & Cardiac Risk Stratification - 02/13/18 1259      Activity Barriers & Cardiac Risk Stratification   Activity Barriers  Assistive Device;Shortness of Breath       6 Minute Walk: 6 Minute Walk    Row Name 02/13/18 1140         6 Minute Walk   Distance  825 feet     Walk Time  6 minutes     # of Rest Breaks  0     MPH  1.56     METS  2.15     RPE  13     Perceived Dyspnea   2     VO2 Peak  2.9     Resting HR  77 bpm     Resting BP  118/60     Resting Oxygen Saturation   96 %     Exercise Oxygen Saturation  during 6 min walk  83 %     Max Ex. HR  103 bpm     Max Ex. BP  138/62     2 Minute Post BP  128/68       Interval HR   1 Minute HR  93     2 Minute HR  94     3 Minute HR  94     4 Minute HR  99     5 Minute HR  98     6 Minute HR  103     2 Minute Post HR  95     Interval Heart Rate?  Yes       Interval Oxygen   Interval Oxygen?  Yes     Baseline Oxygen Saturation %  96 %     1 Minute Oxygen Saturation %  88 %     1 Minute Liters of Oxygen  2 L     2 Minute Oxygen Saturation %  89 %     2 Minute Liters of Oxygen  2 L     3 Minute Oxygen Saturation %  86 %     3 Minute Liters of Oxygen  2 L     4 Minute Oxygen Saturation %  87 %     4 Minute Liters of Oxygen  2 L     5 Minute Oxygen Saturation %  84 %     5  Minute Liters of Oxygen  2 L     6 Minute Oxygen Saturation %  83 %     6 Minute Liters of Oxygen  2 L     2 Minute Post Oxygen Saturation %  94 %     2 Minute Post Liters of Oxygen  2 L       Oxygen Initial Assessment: Oxygen Initial Assessment - 02/13/18 1254      Home Oxygen   Home Oxygen Device  Home Concentrator;E-Tanks    Sleep Oxygen Prescription  Continuous    Liters per minute  2    Home Exercise Oxygen Prescription  Continuous    Liters per minute  2    Home at Rest Exercise Oxygen Prescription   Continuous    Liters per minute  2    Compliance with Home Oxygen Use  Yes      Initial 6 min Walk   Oxygen Used  Continuous    Liters per minute  2      Program Oxygen Prescription   Program Oxygen Prescription  Continuous    Liters per minute  2      Intervention   Short Term Goals  To learn and exhibit compliance with exercise, home and travel O2 prescription;To learn and understand importance of monitoring SPO2 with pulse oximeter and demonstrate accurate use of the pulse oximeter.;To learn and understand importance of maintaining oxygen saturations>88%;To learn and demonstrate proper pursed lip breathing techniques or other breathing techniques.;To learn and demonstrate proper use of respiratory medications    Long  Term Goals  Exhibits compliance with exercise, home and travel O2 prescription;Verbalizes importance of monitoring SPO2 with pulse oximeter and return demonstration;Maintenance of O2 saturations>88%;Exhibits proper breathing techniques, such as pursed lip breathing or other method taught during program session;Compliance with respiratory medication;Demonstrates proper use of MDI's       Oxygen Re-Evaluation: Oxygen Re-Evaluation    Row Name 02/16/18 1019 02/28/18 1103 04/02/18 1043 04/30/18 1058       Program Oxygen Prescription   Program Oxygen Prescription  Continuous  Continuous;E-Tanks  Continuous;E-Tanks  Continuous;E-Tanks    Liters per minute  2  -  4  4      Home Oxygen   Home Oxygen Device  Home Concentrator;E-Tanks  Home Concentrator;E-Tanks  Home Concentrator;E-Tanks  Home Concentrator;E-Tanks    Sleep Oxygen Prescription  Continuous  Continuous  Continuous  Continuous    Liters per minute  _0 Home Exercise Oxygen Prescription  Continuous  Continuous  Continuous  Continuous    Liters per minute  _1 Home at Rest Exercise Oxygen Prescription  Continuous  Continuous  Continuous  Continuous    Liters per minute  _2 Compliance with Home Oxygen Use  Yes  -  Yes  Yes      Goals/Expected Outcomes   Short Term Goals  To learn and exhibit compliance with exercise, home and travel O2 prescription;To learn and understand importance of monitoring SPO2 with pulse oximeter and demonstrate accurate use of the pulse oximeter.;To learn and understand importance of maintaining oxygen saturations>88%;To learn and demonstrate proper pursed lip breathing techniques or other breathing techniques.;To learn and demonstrate proper use of respiratory medications  To learn and exhibit compliance with exercise, home and travel O2 prescription;To learn and understand importance of monitoring SPO2 with pulse oximeter  and demonstrate accurate use of the pulse oximeter.;To learn and understand importance of maintaining oxygen saturations>88%;To learn and demonstrate proper pursed lip breathing techniques or other breathing techniques.;To learn and demonstrate proper use of respiratory medications  To learn and exhibit compliance with exercise, home and travel O2 prescription;To learn and understand importance of monitoring SPO2 with pulse oximeter and demonstrate accurate use of the pulse oximeter.;To learn and understand importance of maintaining oxygen saturations>88%;To learn and demonstrate proper pursed lip breathing techniques or other breathing techniques.;To learn and demonstrate proper use of respiratory medications  To learn and exhibit compliance with exercise, home and travel O2 prescription;To learn and understand importance of monitoring SPO2 with pulse oximeter and demonstrate accurate use of the pulse oximeter.;To learn and understand importance of maintaining oxygen saturations>88%;To learn and demonstrate proper pursed lip breathing techniques or other breathing techniques.;To learn and demonstrate proper use of respiratory medications    Long  Term Goals  Exhibits compliance with exercise, home and travel O2  prescription;Verbalizes importance of monitoring SPO2 with pulse oximeter and return demonstration;Maintenance of O2 saturations>88%;Exhibits proper breathing techniques, such as pursed lip breathing or other method taught during program session;Compliance with respiratory medication;Demonstrates proper use of MDI's  Exhibits compliance with exercise, home and travel O2 prescription;Verbalizes importance of monitoring SPO2 with pulse oximeter and return demonstration;Maintenance of O2 saturations>88%;Exhibits proper breathing techniques, such as pursed lip breathing or other method taught during program session;Compliance with respiratory medication  Exhibits compliance with exercise, home and travel O2 prescription;Verbalizes importance of monitoring SPO2 with pulse oximeter and return demonstration;Maintenance of O2 saturations>88%;Exhibits proper breathing techniques, such as pursed lip breathing or other method taught during program session;Compliance with respiratory medication  Exhibits compliance with exercise, home and travel O2 prescription;Verbalizes importance of monitoring SPO2 with pulse oximeter and return demonstration;Maintenance of O2 saturations>88%;Exhibits proper breathing techniques, such as pursed lip breathing or other method taught during program session;Compliance with respiratory medication    Comments  Reviewed PLB technique with pt.  Talked about how it work and it's important to maintaining his exercise saturations.    Reviewed SPO2 readings with Bug. He verbalized understanding of >90 resting and >88 with exercise. He stated that he sees it drop to 85 when he is active sometimes so he stops and purselipped breathes to increase the level back to 90 or above.   Patient checks his oxygen at home routinely. He is trying to get used to PLB more and still has some work to do. We discussed oxygen and his workloads. His stamina has improved since the start of the program. He is taking his  medications everyday and is compliant.  Bug has been taking his inhalers at home. He has been taking spiriva and albuterol. He has no questions on his medications. His oxygen has been within normal limits on 4 liters while exercising. He wears 2 or 3 liters at homw when he is resting. Patient has no other questions about his oxygen.     Goals/Expected Outcomes  Short: Become more profiecient at using PLB.   Long: Become independent at using PLB.  STG: continues to use his PLB techniques to keep O2 sat above 88%, uses his nebulizer correctly. LTG Using PLB technique without coaching when saturation drops below 88%, maintains proper use of nebulizer.    Short: continue [employer] to increase stamina and SOB. Long: be independent with PLB while walking.  Short: increase loads while maintaining oxygen levels. Long: maintain oxygen saturation above 88 percent until the end of  the program.       Oxygen Discharge (Final Oxygen Re-Evaluation): Oxygen Re-Evaluation - 04/30/18 1058      Program Oxygen Prescription   Program Oxygen Prescription  Continuous;E-Tanks    Liters per minute  4      Home Oxygen   Home Oxygen Device  Home Concentrator;E-Tanks    Sleep Oxygen Prescription  Continuous    Liters per minute  2    Home Exercise Oxygen Prescription  Continuous    Liters per minute  2    Home at Rest Exercise Oxygen Prescription  Continuous    Liters per minute  2    Compliance with Home Oxygen Use  Yes      Goals/Expected Outcomes   Short Term Goals  To learn and exhibit compliance with exercise, home and travel O2 prescription;To learn and understand importance of monitoring SPO2 with pulse oximeter and demonstrate accurate use of the pulse oximeter.;To learn and understand importance of maintaining oxygen saturations>88%;To learn and demonstrate proper pursed lip breathing techniques or other breathing techniques.;To learn and demonstrate proper use of respiratory medications    Long  Term Goals   Exhibits compliance with exercise, home and travel O2 prescription;Verbalizes importance of monitoring SPO2 with pulse oximeter and return demonstration;Maintenance of O2 saturations>88%;Exhibits proper breathing techniques, such as pursed lip breathing or other method taught during program session;Compliance with respiratory medication    Comments  Bug has been taking his inhalers at home. He has been taking spiriva and albuterol. He has no questions on his medications. His oxygen has been within normal limits on 4 liters while exercising. He wears 2 or 3 liters at homw when he is resting. Patient has no other questions about his oxygen.     Goals/Expected Outcomes  Short: increase loads while maintaining oxygen levels. Long: maintain oxygen saturation above 88 percent until the end of the program.       Initial Exercise Prescription: Initial Exercise Prescription - 02/13/18 1100      Date of Initial Exercise RX and Referring Provider   Date  02/13/18    Referring Provider  McQuaid      Oxygen   Oxygen  Continuous    Liters  2      Treadmill   MPH  1    Grade  0    Minutes  15    METs  1.77      NuStep   Level  2    SPM  80    Minutes  15    METs  1.5      Arm Ergometer   Level  1    Minutes  15    METs  1.5      Track   Minutes  15      Prescription Details   Frequency (times per week)  3    Duration  Progress to 45 minutes of aerobic exercise without signs/symptoms of physical distress      Intensity   THRR 40-80% of Max Heartrate  95-113    Ratings of Perceived Exertion  11-13    Perceived Dyspnea  0-4      Resistance Training   Training Prescription  Yes    Weight  2 lb    Reps  10-15       Perform Capillary Blood Glucose checks as needed.  Exercise Prescription Changes: Exercise Prescription Changes    Row Name 03/01/18 0800 03/02/18 1100 03/15/18 1000 03/28/18 1200 04/11/18 1400  Response to Exercise   Blood Pressure (Admit)  132/72  -  126/64   104/54  104/68   Blood Pressure (Exit)  124/62  -  126/64  118/56  126/64   Heart Rate (Admit)  84 bpm  -  54 bpm  80 bpm  97 bpm   Heart Rate (Exercise)  109 bpm  -  113 bpm  109 bpm  113 bpm   Heart Rate (Exit)  97 bpm  -  93 bpm  97 bpm  91 bpm   Oxygen Saturation (Admit)  92 %  -  94 %  93 %  90 %   Oxygen Saturation (Exercise)  87 %  -  87 %  87 %  87 %   Oxygen Saturation (Exit)  93 %  -  95 %  87 %  91 %   Rating of Perceived Exertion (Exercise)  14  -  _0 Perceived Dyspnea (Exercise)  2  -  _1 Symptoms  none  -  none  none  none   Duration  Continue with 45 min of aerobic exercise without signs/symptoms of physical distress.  -  Continue with 45 min of aerobic exercise without signs/symptoms of physical distress.  Continue with 45 min of aerobic exercise without signs/symptoms of physical distress.  Continue with 45 min of aerobic exercise without signs/symptoms of physical distress.   Intensity  THRR unchanged  -  THRR unchanged  THRR unchanged  THRR unchanged     Progression   Progression  Continue to progress workloads to maintain intensity without signs/symptoms of physical distress.  -  Continue to progress workloads to maintain intensity without signs/symptoms of physical distress.  Continue to progress workloads to maintain intensity without signs/symptoms of physical distress.  Continue to progress workloads to maintain intensity without signs/symptoms of physical distress.   Average METs  2  -  1.9  1.93  2     Resistance Training   Training Prescription  Yes  -  Yes  Yes  Yes   Weight   3 lb  -  3 lb  3 lb  3 lb   Reps  10-15  -  10-15  10-15  10-15     Interval Training   Interval Training  No  -  No  No  No     Oxygen   Oxygen  Continuous  -  -  -  -   Liters  4  -  -  -  -     NuStep   Level  -  -  -  2  -   SPM  -  -  -  80  -   Minutes  -  -  -  15  -   METs  -  -  -  1.6  -     Arm Ergometer   Level  1  -  _2 Minutes  15  -  _3 METs  1.2  -  1.7  1.7  1.9     Track   Minutes  15 25 laps  -  15 24 laps  15 33 laps  15 21 laps     Home Exercise Plan   Plans to continue exercise at  -  Home (comment)  Home (comment)  Home (comment)  Home (comment)   Frequency  -  Add 2 additional days to program exercise sessions.  Add 2 additional days to program exercise sessions.  Add 2 additional days to program exercise sessions.  Add 2 additional days to program exercise sessions.   Initial Home Exercises Provided  -  03/02/18  03/02/18  03/02/18  03/02/18   Row Name 04/26/18 0800             Response to Exercise   Blood Pressure (Admit)  124/80       Blood Pressure (Exit)  128/70       Heart Rate (Admit)  70 bpm       Heart Rate (Exercise)  91 bpm       Heart Rate (Exit)  72 bpm       Oxygen Saturation (Admit)  98 %       Oxygen Saturation (Exercise)  86 %       Oxygen Saturation (Exit)  99 %       Rating of Perceived Exertion (Exercise)  12       Perceived Dyspnea (Exercise)  1       Symptoms  none       Duration  Continue with 45 min of aerobic exercise without signs/symptoms of physical distress.       Intensity  THRR unchanged         Progression   Progression  Continue to progress workloads to maintain intensity without signs/symptoms of physical distress.       Average METs  2 for walking         Resistance Training   Training Prescription  Yes       Weight  3 lb       Reps  10-15         Interval Training   Interval Training  No         Arm Ergometer   Level  2       Minutes  15       METs  1.2         Track   Minutes  15 23 laps         Home Exercise Plan   Plans to continue exercise at  Home (comment)       Frequency  Add 2 additional days to program exercise sessions.       Initial Home Exercises Provided  03/02/18          Exercise Comments: Exercise Comments    Row Name 02/16/18 1018 03/02/18 1117         Exercise Comments  First full day of exercise!  Patient was  oriented to gym and equipment including functions, settings, policies, and procedures.  Patient's individual exercise prescription and treatment plan were reviewed.  All starting workloads were established based on the results of the 6 minute walk test done at initial orientation visit.  The plan for exercise progression was also introduced and progression will be customized based on patient's performance and goals.  Reviewed home exercise with pt today.  Pt plans to walk and consider [place] for exercise.  Reviewed THR, pulse, RPE, sign and symptoms, NTG use, and when to call 911 or MD.  Also discussed weather considerations and indoor options.  Pt voiced understanding.         Exercise Goals and Review: Exercise Goals    Row Name 02/13/18 1139  Exercise Goals   Increase Physical Activity  Yes       Intervention  Provide advice, education, support and counseling about physical activity/exercise needs.;Develop an individualized exercise prescription for aerobic and resistive training based on initial evaluation findings, risk stratification, comorbidities and participant's personal goals.       Expected Outcomes  Short Term: Attend rehab on a regular basis to increase amount of physical activity.;Long Term: Add in home exercise to make exercise part of routine and to increase amount of physical activity.;Long Term: Exercising regularly at least 3-5 days a week.       Increase Strength and Stamina  Yes       Intervention  Provide advice, education, support and counseling about physical activity/exercise needs.;Develop an individualized exercise prescription for aerobic and resistive training based on initial evaluation findings, risk stratification, comorbidities and participant's personal goals.       Expected Outcomes  Short Term: Increase workloads from initial exercise prescription for resistance, speed, and METs.;Short Term: Perform resistance training exercises routinely during  rehab and add in resistance training at home;Long Term: Improve cardiorespiratory fitness, muscular endurance and strength as measured by increased METs and functional capacity (6MWT)       Able to understand and use rate of perceived exertion (RPE) scale  Yes       Intervention  Provide education and explanation on how to use RPE scale       Expected Outcomes  Short Term: Able to use RPE daily in rehab to express subjective intensity level;Long Term:  Able to use RPE to guide intensity level when exercising independently       Able to understand and use Dyspnea scale  Yes       Intervention  Provide education and explanation on how to use Dyspnea scale       Expected Outcomes  Short Term: Able to use Dyspnea scale daily in rehab to express subjective sense of shortness of breath during exertion;Long Term: Able to use Dyspnea scale to guide intensity level when exercising independently       Knowledge and understanding of Target Heart Rate Range (THRR)  Yes       Intervention  Provide education and explanation of THRR including how the numbers were predicted and where they are located for reference       Expected Outcomes  Short Term: Able to state/look up THRR;Short Term: Able to use daily as guideline for intensity in rehab;Long Term: Able to use THRR to govern intensity when exercising independently       Able to check pulse independently  Yes       Intervention  Provide education and demonstration on how to check pulse in carotid and radial arteries.;Review the importance of being able to check your own pulse for safety during independent exercise       Expected Outcomes  Short Term: Able to explain why pulse checking is important during independent exercise;Long Term: Able to check pulse independently and accurately       Understanding of Exercise Prescription  Yes       Intervention  Provide education, explanation, and written materials on patient's individual exercise prescription        Expected Outcomes  Short Term: Able to explain program exercise prescription;Long Term: Able to explain home exercise prescription to exercise independently          Exercise Goals Re-Evaluation : Exercise Goals Re-Evaluation    Row Name 02/16/18 1018 03/01/18 0844 03/02/18 1117  03/15/18 1030 03/28/18 1225     Exercise Goal Re-Evaluation   Exercise Goals Review  Understanding of Exercise Prescription;Able to understand and use rate of perceived exertion (RPE) scale;Able to understand and use Dyspnea scale;Knowledge and understanding of Target Heart Rate Range (THRR)  Increase Physical Activity;Increase Strength and Stamina;Able to understand and use rate of perceived exertion (RPE) scale;Able to understand and use Dyspnea scale  Increase Physical Activity;Able to understand and use rate of perceived exertion (RPE) scale;Knowledge and understanding of Target Heart Rate Range (THRR);Understanding of Exercise Prescription;Increase Strength and Stamina;Able to understand and use Dyspnea scale  Increase Physical Activity;Increase Strength and Stamina;Able to understand and use rate of perceived exertion (RPE) scale;Able to understand and use Dyspnea scale;Knowledge and understanding of Target Heart Rate Range (THRR)  Increase Physical Activity;Increase Strength and Stamina;Able to understand and use rate of perceived exertion (RPE) scale;Able to understand and use Dyspnea scale   Comments  Reviewed RPE scale, THR and program prescription with pt today.  Pt voiced understanding and was given a copy of goals to take home.   Bug is tolerating exercise very well.  He did 25 laps walking and has increased strength training to 3 lb.  Reviewed home exercise with pt today.  Pt plans to walk and consider [place] for exercise.  Reviewed THR, pulse, RPE, sign and symptoms, NTG use, and when to call 911 or MD.  Also discussed weather considerations and indoor options.  Pt voiced understanding.  Bug tolerates exercise  well.  He walks 24-29 laps with his 02 tank most days.  He has some days he is fatigued from other activities.    Bug is progressing well with exercise.  He is practicing PLB and not open mouth breathing to help maintain 02 sats above 88.     Expected Outcomes  Short: Use RPE daily to regulate intensity. Long: Follow program prescription in THR.  Short - attend class consistently Long - increase MET level  Short - walk on days not at [place] to maintain exercise  Short - continue to attend consistently Long - increase overall MET level  Short - continue to attend consistently Long - increase MET level   [place] Name 04/11/18 1448 04/26/18 0842           Exercise Goal Re-Evaluation   Exercise Goals Review  Increase Physical Activity;Increase Strength and Stamina;Able to understand and use rate of perceived exertion (RPE) scale;Able to understand and use Dyspnea scale  Increase Physical Activity;Increase Strength and Stamina;Able to understand and use rate of perceived exertion (RPE) scale;Able to understand and use Dyspnea scale;Knowledge and understanding of Target Heart Rate Range (THRR)      Comments  Bug tolerates exercise very well.  He walks the track with 02 and does arm crank and NuStep with no complaints.  Bug attends consistently.  His METs have been lower on NS and AC, possibly due to slowing when staff asks RPE.  He does very well walking except a few occasions where his 02 dropped.  Staff will monitor progress.        Expected Outcomes  Short - continue to attend consistently Long - Maintain exercise on his own  Short - attend consistently Long - maintain exercise on his own         Discharge Exercise Prescription (Final Exercise Prescription Changes): Exercise Prescription Changes - 04/26/18 0800      Response to Exercise   Blood Pressure (Admit)  124/80  Blood Pressure (Exit)  128/70    Heart Rate (Admit)  70 bpm    Heart Rate (Exercise)  91 bpm    Heart Rate  (Exit)  72 bpm    Oxygen Saturation (Admit)  98 %    Oxygen Saturation (Exercise)  86 %    Oxygen Saturation (Exit)  99 %    Rating of Perceived Exertion (Exercise)  12    Perceived Dyspnea (Exercise)  1    Symptoms  none    Duration  Continue with 45 min of aerobic exercise without signs/symptoms of physical distress.    Intensity  THRR unchanged      Progression   Progression  Continue to progress workloads to maintain intensity without signs/symptoms of physical distress.    Average METs  2   for walking     Resistance Training   Training Prescription  Yes    Weight  3 lb    Reps  10-15      Interval Training   Interval Training  No      Arm Ergometer   Level  2    Minutes  15    METs  1.2      Track   Minutes  15   23 laps     Home Exercise Plan   Plans to continue exercise at  Home (comment)    Frequency  Add 2 additional days to program exercise sessions.    Initial Home Exercises Provided  03/02/18       Nutrition:  Target Goals: Understanding of nutrition guidelines, daily intake of sodium <1573m, cholesterol <2023m calories 30% from fat and 7% or less from saturated fats, daily to have 5 or more servings of fruits and vegetables.  Biometrics: Pre Biometrics - 02/13/18 1138      Pre Biometrics   Height  6' (1.829 m)    Weight  189 lb 1.6 oz (85.8 kg)    Waist Circumference  37.75 inches    Hip Circumference  42 inches    Waist to Hip Ratio  0.9 %    BMI (Calculated)  25.64        Nutrition Therapy Plan and Nutrition Goals: Nutrition Therapy & Goals - 02/21/18 1044      Nutrition Therapy   Diet  TLC    Protein (specify units)  8oz    Fiber  30 grams    Whole Grain Foods  3 servings   does not always choose whole grains   Saturated Fats  14 max. grams    Fruits and Vegetables  6 servings/day   8 ideal; eats a variety of fruits and vegetables but does not have a large appetite   Sodium  2000 grams      Personal Nutrition Goals   Nutrition  Goal  Continue to drink three 8oz Ensure nutritional drinks daily between meals. However, if you find that you are too full to eat at meal times, consider drinking one less per day to prioritize whole food nutrition    Personal Goal #2  Maintain current diet variety and prioritize fluids daily, ideally drinking more water than sweet tea    Comments  He eats 3 meals per day and chooses fruit and occasionally ice cream as snacks. He usually has cereal and OJ for breakfast, a sandwich or salad for lunch, and his dinners are provided by his daughter. Reports appetite to have decreased over the past year. [facility]  Intervention Plan   Intervention  Prescribe, educate and counsel regarding individualized specific dietary modifications aiming towards targeted core components such as weight, hypertension, lipid management, diabetes, heart failure and other comorbidities.    Expected Outcomes  Short Term Goal: A plan has been developed with personal nutrition goals set during dietitian appointment.;Long Term Goal: Adherence to prescribed nutrition plan.;Short Term Goal: Understand basic principles of dietary content, such as calories, fat, sodium, cholesterol and nutrients.       Nutrition Assessments:   Nutrition Goals Re-Evaluation: Nutrition Goals Re-Evaluation    [place] Name 02/21/18 1053 03/19/18 1033 04/11/18 1045         Goals   Nutrition Goal  Continue to drink three 8oz Ensure nutritional drinks daily between meals. However, if you find that you are too full to eat at meal times, consider drinking one less per day to prioritize whole food nutrition  Continue to drink 3 Ensure nutritional supplements daily d/t fair appetite and maintain current diet variety. Keep on a regular meal schedule (breakfast, lunch and dinner) as much as possible  Continue to drink 3 Ensure nutritional supplement drinks daily between meals in addition to regular meals; maintain current  diet variety and prioritize low/zero sugar fluids     Comment  He drinks 3 Ensure supplements per day as provided by the [place]. C/o decreased appetite but is able to eat 3 meals + supplements currently.  His daughter brings him lunch and suppers for the week, and he typically makes his own breakfast. He is drinking 3 Ensure drinks daily consistently  His daughter continues to supply most lunch and dinner meals and he makes his breakfasts at home. He reports no significant changes in appetite or diet.      Expected Outcome  He will prioritize nutrition from whole foods over supplement drinks, and continue to eat on a regular schedule  Continue to drink Ensure drinks 1-3x/day in addition to regular meals. Prioritize whole food nutrition  Maintain CBW +/- 5# and overall diet variety. Eat on a regular schedule and use Ensure nutritional drinks as needed to help maintain CBW       Personal Goal #2 Re-Evaluation   Personal Goal #2  Maintain current diet variety and prioritize fluids daily, ideally drinking more water than sweet tea  -  -        Nutrition Goals Discharge (Final Nutrition Goals Re-Evaluation): Nutrition Goals Re-Evaluation - 04/11/18 1045      Goals   Nutrition Goal  Continue to drink 3 Ensure nutritional supplement drinks daily between meals in addition to regular meals; maintain current diet variety and prioritize low/zero sugar fluids    Comment  His daughter continues to supply most lunch and dinner meals and he makes his breakfasts at home. He reports no significant changes in appetite or diet.     Expected Outcome  Maintain CBW +/- 5# and overall diet variety. Eat on a regular schedule and use Ensure nutritional drinks as needed to help maintain CBW       Psychosocial: Target Goals: Acknowledge presence or absence of significant depression and/or stress, maximize coping skills, provide positive support system. Participant is able to verbalize types and ability to use techniques and  skills needed for reducing stress and depression.   Initial Review & Psychosocial Screening: Initial Psych Review & Screening - 02/13/18 1259      Initial Review   Current issues with  None Identified   Was doing yard work up to last  event in July. Does miss getting out to do his own [place]?  Yes   Daughter     Barriers   Psychosocial barriers to participate in program  There are no identifiable barriers or psychosocial needs.;The patient should benefit from training in stress management and relaxation.      Screening Interventions   Interventions  Encouraged to exercise    Expected Outcomes  Short Term goal: Utilizing psychosocial counselor, staff and physician to assist with identification of specific Stressors or current issues interfering with healing process. Setting desired goal for each stressor or current issue identified.;Long Term Goal: Stressors or current issues are controlled or eliminated.;Short Term goal: Identification and review with participant of any Quality of Life or Depression concerns found by scoring the questionnaire.;Long Term goal: The participant improves quality of Life and PHQ9 Scores as seen by post scores and/or verbalization of changes       Quality of Life Scores:  Scores of 19 and below usually indicate a poorer quality of life in these areas.  A difference of  2-3 points is a clinically meaningful difference.  A difference of 2-3 points in the total score of the Quality of Life Index has been associated with significant improvement in overall quality of life, self-image, physical symptoms, and general health in studies assessing change in quality of life.  PHQ-9: Recent Review Flowsheet Data    Depression screen [facility] 2/9 05/02/2018 04/20/2018 02/21/2018 02/08/2018   Decreased Interest _0 0   Down, Depressed, Hopeless 1 0 1 1   PHQ - 2 Score _1 Altered sleeping 2 0 1 -   Tired, decreased energy _2 -   Change in appetite 2 0 2 -   Feeling bad or failure about yourself  1 0 0 -   Trouble concentrating 0 0 0 -   Moving slowly or fidgety/restless 0 0 2 -   Suicidal thoughts 0 0 0 -   PHQ-9 Score _3 -   Difficult doing work/chores Somewhat difficult Somewhat difficult Somewhat difficult -     Interpretation of Total Score  Total Score Depression Severity:  1-4 = Minimal depression, 5-9 = Mild depression, 10-14 = Moderate depression, 15-19 = Moderately severe depression, 20-27 = Severe depression   Psychosocial Evaluation and Intervention:   Psychosocial Re-Evaluation: Psychosocial Re-Evaluation    [place] Name 02/21/18 1100 04/02/18 1048 04/20/18 1406 05/02/18 1057       Psychosocial Re-Evaluation   Current issues with  Current Stress Concerns  None Identified  Current Stress Concerns  None Identified    Comments  Counselor met with Mr. Winton "Bug" today for initial psychosocial evaluation.  He is an amazing 84 year old who has been in fairly good health most of his life.  Bug has a strong support system with a daughter who lives close by and does most of his driving for him currently; a strong faith community; and the [place] has nurses visiting and checking in on him regularly.  He reports sleeping well (approximately 8 hours) and has a "fair" appetite.  Bug denies a history of depression or anxiety or any current symptoms and he is typically in a good mood.  Counselor reviewed the PHQ-9 initial score of "13" indicating moderate symptoms of depression - but Bug changed a few of the answers about sleep and energy which decreased his score to a "  9" indicating mild symptoms present and mostly related to his physical health.  But states the stress he is currently experiencing relates to being on oxygen full time and how his health and the O2 tank limit him from driving and going places he wants to whenever he wants.  He has goals for this program to get off the full time oxygen and be able to  drive himself at least short distances again.  Staff will follow with Bug.  Bug states that everyting is going well and is enjoying [facility].  Reviewed patient health questionnaire (PHQ-9) with patient for follow up. Previously, patients score indicated signs/symptoms of depression.  Reviewed to see if patient is improving symptom wise while in program.  Score improved because he has been getting better sleep, eating better and feeling less depressed.  Counselor follow up with "Bug" today reporting he hasn't seen much benefit to this program, but knows exercise is a positive thing for his health.  His daughter brings him but Bug reports he drives himself short distances on occasion and feels more independence in that.  Bug comes from a large family of 12 siblings with (66) siblings younger than him.  His brother is in poor health and is 32 years old (2 years younger than Bug) and he visited with him earlier this week.  Bug at 89 is functioning well with sleeping well and minimal stress.  He maintains a positive attitude and plans to continue exercising consistently upon completion of this program.  Staff will continue to follow with him.     Expected Outcomes  Short:  Bug will exercise consistently to improve his health and manage his stress better.   Long:  Bug will hopefully be able to decrease his oxygen tank use and be able to drive short distances again.    Short: attend [facility] to decrease any stress. Long: Maintain exercise to keep stress levels at a minimum.  Short: Continue to attend [facility] regularly for regular exercise and social engagement. Long: Continue to improve symptoms and manage a positive mental state  Short:  Bug will continue to exercise regularly because he knows it "is good for him."   Long:  Bug is 84 years old - he plans to continue to exercise consistently - which is remarkable!     Interventions  -  Encouraged to attend Pulmonary Rehabilitation for the exercise  Encouraged to attend  Pulmonary Rehabilitation for the exercise  -    Continue Psychosocial Services   Follow up required by staff  Follow up required by staff  Follow up required by staff  -       Psychosocial Discharge (Final Psychosocial Re-Evaluation): Psychosocial Re-Evaluation - 05/02/18 1057      Psychosocial Re-Evaluation   Current issues with  None Identified    Comments  Counselor follow up with "Bug" today reporting he hasn't seen much benefit to this program, but knows exercise is a positive thing for his health.  His daughter brings him but Bug reports he drives himself short distances on occasion and feels more independence in that.  Bug comes from a large family of 12 siblings with (22) siblings younger than him.  His brother is in poor health and is 67 years old (2 years younger than Bug) and he visited with him earlier this week.  Bug at 89 is functioning well with sleeping well and minimal stress.  He maintains a positive attitude and plans to continue exercising consistently upon completion of this  program.  Staff will continue to follow with him.     Expected Outcomes  Short:  Bug will continue to exercise regularly because he knows it "is good for him."   Long:  Bug is 84 years old - he plans to continue to exercise consistently - which is remarkable!        Education: Education Goals: Education classes will be provided on a weekly basis, covering required topics. Participant will state understanding/return demonstration of topics presented.  Learning Barriers/Preferences: Learning Barriers/Preferences - 02/13/18 1300      Learning Barriers/Preferences   Learning Barriers  Hearing   Wears hearing aids.  does not always were them   Learning Preferences  None       Education Topics:  Initial Evaluation Education: - Verbal, written and demonstration of respiratory meds, oximetry and breathing techniques. Instruction on use of nebulizers and MDIs and importance of monitoring MDI  activations.   Pulmonary Rehab from 05/04/2018 in [facility] Cardiac and Pulmonary Rehab  Date  02/13/18  Educator  SB  Instruction Review Code  1- Verbalizes Understanding      General Nutrition Guidelines/Fats and Fiber: -Group instruction provided by verbal, written material, models and posters to present the general guidelines for heart healthy nutrition. Gives an explanation and review of dietary fats and fiber.   Pulmonary Rehab from 05/04/2018 in [facility] Cardiac and Pulmonary Rehab  Date  04/25/18  Educator  LB  Instruction Review Code  1- Verbalizes Understanding      Controlling Sodium/Reading Food Labels: -Group verbal and written material supporting the discussion of sodium use in heart healthy nutrition. Review and explanation with models, verbal and written materials for utilization of the food label.   Pulmonary Rehab from 05/04/2018 in [facility] Cardiac and Pulmonary Rehab  Date  03/07/18  Educator  LB  Instruction Review Code  1- Verbalizes Understanding      Exercise Physiology & General Exercise Guidelines: - Group verbal and written instruction with models to review the exercise physiology of the cardiovascular system and associated critical values. Provides general exercise guidelines with specific guidelines to those with heart or lung disease.    Pulmonary Rehab from 05/04/2018 in [facility] Cardiac and Pulmonary Rehab  Date  03/14/18  Educator  [facility]  Instruction Review Code  1- Verbalizes Understanding      Aerobic Exercise & Resistance Training: - Gives group verbal and written instruction on the various components of exercise. Focuses on aerobic and resistive training programs and the benefits of this training and how to safely progress through these programs.   Flexibility, Balance, Mind/Body Relaxation: Provides group verbal/written instruction on the benefits of flexibility and balance training, including mind/body exercise modes such as yoga, pilates and tai chi.   Demonstration and skill practice provided.   Pulmonary Rehab from 05/04/2018 in [facility] Cardiac and Pulmonary Rehab  Date  04/11/18  Educator  AS  Instruction Review Code  1- Verbalizes Understanding      Stress and Anxiety: - Provides group verbal and written instruction about the health risks of elevated stress and causes of high stress.  Discuss the correlation between heart/lung disease and anxiety and treatment options. Review healthy ways to manage with stress and anxiety.   Pulmonary Rehab from 05/04/2018 in [facility] Cardiac and Pulmonary Rehab  Date  05/02/18  Educator  [facility]  Instruction Review Code  1- Verbalizes Understanding      Depression: - Provides group verbal and written instruction on the correlation between heart/lung disease and  depressed mood, treatment options, and the stigmas associated with seeking treatment.   Pulmonary Rehab from 05/04/2018 in [facility] Cardiac and Pulmonary Rehab  Date  04/04/18  Educator  [facility]  Instruction Review Code  1- Verbalizes Understanding      Exercise & Equipment Safety: - Individual verbal instruction and demonstration of equipment use and safety with use of the equipment.   Pulmonary Rehab from 05/04/2018 in [facility] Cardiac and Pulmonary Rehab  Date  02/13/18  Educator  sB  Instruction Review Code  1- Verbalizes Understanding      Infection Prevention: - Provides verbal and written material to individual with discussion of infection control including proper hand washing and proper equipment cleaning during exercise session.   Pulmonary Rehab from 05/04/2018 in [facility] Cardiac and Pulmonary Rehab  Date  02/13/18  Educator  SB1  Instruction Review Code  1- Verbalizes Understanding      Falls Prevention: - Provides verbal and written material to individual with discussion of falls prevention and safety.   Pulmonary Rehab from 05/04/2018 in [facility] Cardiac and Pulmonary Rehab  Date  02/13/18  Educator  SB  Instruction Review Code  1-  Verbalizes Understanding      Diabetes: - Individual verbal and written instruction to review signs/symptoms of diabetes, desired ranges of glucose level fasting, after meals and with exercise. Advice that pre and post exercise glucose checks will be done for 3 sessions at entry of program.   Chronic Lung Diseases: - Group verbal and written instruction to review updates, respiratory medications, advancements in procedures and treatments. Discuss use of supplemental oxygen including available portable oxygen systems, continuous and intermittent flow rates, concentrators, personal use and safety guidelines. Review proper use of inhaler and spacers. Provide informative websites for self-education.    Pulmonary Rehab from 05/04/2018 in [facility] Cardiac and Pulmonary Rehab  Date  03/02/18  Educator  [facility]  Instruction Review Code  1- Verbalizes Understanding      Energy Conservation: - Provide group verbal and written instruction for methods to conserve energy, plan and organize activities. Instruct on pacing techniques, use of adaptive equipment and posture/positioning to relieve shortness of breath.   Pulmonary Rehab from 05/04/2018 in [facility] Cardiac and Pulmonary Rehab  Date  04/18/18  Educator  [facility]  Instruction Review Code  1- Verbalizes Understanding      Triggers and Exacerbations: - Group verbal and written instruction to review types of environmental triggers and ways to prevent exacerbations. Discuss weather changes, air quality and the benefits of nasal washing. Review warning signs and symptoms to help prevent infections. Discuss techniques for effective airway clearance, coughing, and vibrations.   Pulmonary Rehab from 05/04/2018 in [facility] Cardiac and Pulmonary Rehab  Date  03/21/18  Educator  [facility]  Instruction Review Code  1- Verbalizes Understanding      AED/CPR: - Group verbal and written instruction with the use of models to demonstrate the basic use of the AED with the basic ABC's  of resuscitation.   Pulmonary Rehab from 05/04/2018 in [facility] Cardiac and Pulmonary Rehab  Date  04/27/18  Educator  [facility]  Instruction Review Code  1- Actuary and Physiology of the Lungs: - Group verbal and written instruction with the use of models to provide basic lung anatomy and physiology related to function, structure and complications of lung disease.   Pulmonary Rehab from 05/04/2018 in [facility] Cardiac and Pulmonary Rehab  Date  05/04/18  Educator  [facility]  Instruction Review  Code  1- Verbalizes Understanding      Anatomy & Physiology of the Heart: - Group verbal and written instruction and models provide basic cardiac anatomy and physiology, with the coronary electrical and arterial systems. Review of Valvular disease and Heart Failure   Pulmonary Rehab from 05/04/2018 in [facility] Cardiac and Pulmonary Rehab  Date  03/30/18  Educator  [facility]  Instruction Review Code  1- Verbalizes Understanding      Cardiac Medications: - Group verbal and written instruction to review commonly prescribed medications for heart disease. Reviews the medication, class of the drug, and side effects.   Know Your Numbers and Risk Factors: -Group verbal and written instruction about important numbers in your health.  Discussion of what are risk factors and how they play a role in the disease process.  Review of Cholesterol, Blood Pressure, Diabetes, and BMI and the role they play in your overall health.   Pulmonary Rehab from 05/04/2018 in [facility] Cardiac and Pulmonary Rehab  Date  03/16/18  Educator  [facility]  Instruction Review Code  1- Verbalizes Understanding      Sleep Hygiene: -Provides group verbal and written instruction about how sleep can affect your health.  Define sleep hygiene, discuss sleep cycles and impact of sleep habits. Review good sleep hygiene tips.    Pulmonary Rehab from 05/04/2018 in [facility] Cardiac and Pulmonary Rehab  Date  02/21/18  Educator  [facility]  Instruction  Review Code  1- Verbalizes Understanding      Other: -Provides group and verbal instruction on various topics (see comments)    Knowledge Questionnaire Score: Knowledge Questionnaire Score - 05/02/18 1157      Knowledge Questionnaire Score   Pre Score  13/18    Post Score  12/18   reviewed with patient       Core Components/Risk Factors/Patient Goals at Admission: Personal Goals and Risk Factors at Admission - 02/13/18 1301      Core Components/Risk Factors/Patient Goals on Admission   Improve shortness of breath with ADL's  Yes    Intervention  Provide education, individualized exercise plan and daily activity instruction to help decrease symptoms of SOB with activities of daily living.    Expected Outcomes  Short Term: Improve cardiorespiratory fitness to achieve a reduction of symptoms when performing ADLs;Long Term: Be able to perform more ADLs without symptoms or delay the onset of symptoms    Hypertension  Yes    Intervention  Provide education on lifestyle modifcations including regular physical activity/exercise, weight management, moderate sodium restriction and increased consumption of fresh fruit, vegetables, and low fat dairy, alcohol moderation, and smoking cessation.;Monitor prescription use compliance.    Expected Outcomes  Short Term: Continued assessment and intervention until BP is < 140/47m HG in hypertensive participants. < 130/84mHG in hypertensive participants with diabetes, heart failure or chronic kidney disease.;Long Term: Maintenance of blood pressure at goal levels.    Lipids  Yes    Intervention  Provide education and support for participant on nutrition & aerobic/resistive exercise along with prescribed medications to achieve LDL <7058mHDL >62m41m  Expected Outcomes  Short Term: Participant states understanding of desired cholesterol values and is compliant with medications prescribed. Participant is following exercise prescription and nutrition  guidelines.;Long Term: Cholesterol controlled with medications as prescribed, with individualized exercise RX and with personalized nutrition plan. Value goals: LDL < 70mg54mL > 40 mg.       Core Components/Risk Factors/Patient Goals Review:  Goals and Risk Factor Review  [place] Name 02/28/18 1058 04/02/18 1050 04/30/18 1103         Core Components/Risk Factors/Patient Goals Review   Personal Goals Review  Improve shortness of breath with ADL's;Lipids;Hypertension  Improve shortness of breath with ADL's  Improve shortness of breath with ADL's     Review  Talked with Bug today about his goals. He does not have SOB except with extended walks.  He does well walking down his driveway and street short distance. He is concerned that he is not walking more laps each day"my legs get tired"". Reviewed that he will be here 12 weeks nad has time to strengthen his muscles and improve his walking distance. He verbalized understanding. No concerns with HTN and Cholesterol today.   Patient has improved with his shortness of breath. He has regular attendance in [place] and is able to walk further than before.  He is able to get around his house a little better without getting too short of breath.  Bug states he does not do that much at home and his shortness of breath varries. His daughter does things for him and he has a person that comes in and cleans for him. He is trying to stay busy and do things for himself.     Expected Outcomes  STG: Bug continues with exercise prescription. LTG Bug sees improvement in walking distance without increased SOB  Short: attned class to further improve ADL's. Long: Graduate [facility] and continue to exercise.  Short: attened class to further improve ADL's. Long: Graduate [facility] and join a gym to exercise.        Core Components/Risk Factors/Patient Goals at Discharge (Final Review):  Goals and Risk Factor Review - 04/30/18 1103      Core Components/Risk Factors/Patient  Goals Review   Personal Goals Review  Improve shortness of breath with ADL's    Review  Bug states he does not do that much at home and his shortness of breath varries. His daughter does things for him and he has a person that comes in and cleans for him. He is trying to stay busy and do things for himself.    Expected Outcomes  Short: attened class to further improve ADL's. Long: Graduate [facility] and join a gym to exercise.       ITP Comments: ITP Comments    Row Name 02/13/18 1247 03/12/18 0826 04/09/18 0844 05/07/18 0831     ITP Comments  Bug arrived, completed his 6 min walk and at that point we found out he was still receiving [place]. Could not complete the Medical review.  WIll use paperwork to complete ITP evaluation assessments and send to Dr Sabra Heck for review, changes as needed and signature.   Documentation of diagnosis can be found in [facility] 01/25/2018  30 day review completed. ITP sent to Dr. Emily Filbert Director of [place]. Continue with ITP unless changes are made by physician  30 day review completed. ITP sent to Dr. Emily Filbert Director of [place]. Continue with ITP unless changes are made by physician.  30 day review completed. ITP sent to Dr. Emily Filbert Director of [place]. Continue with ITP unless changes are made by physician.       Comments: 30 day review

## 2018-05-09 ENCOUNTER — Encounter: Payer: Medicare Other | Admitting: *Deleted

## 2018-05-09 DIAGNOSIS — J849 Interstitial pulmonary disease, unspecified: Secondary | ICD-10-CM | POA: Diagnosis not present

## 2018-05-09 NOTE — Progress Notes (Signed)
Daily Session Note  Patient Details  Name: Paul Cross MRN: [id number] Date of Birth: [birth year] Referring Provider:     Pulmonary Rehab from 02/13/2018 in [facility] Cardiac and Pulmonary Rehab  Referring Provider  McQuaid      Encounter Date: 05/09/2018  Check In: Session Check In - 05/09/18 1010      Check-In   Supervising physician immediately available to respond to emergencies  [facility] immediately available ER MD    Physician(s)  Drs. Malinda and [facility]    Location  [facility]-Cardiac & Pulmonary Rehab    Staff Present  Alberteen Sam, MA, RCEP, CCRP, Exercise Physiologist;Joseph [employer], [place], ACSM CEP, Exercise Physiologist    Medication changes reported      No    Fall or balance concerns reported     No    Warm-up and Cool-down  Performed as group-led instruction    Resistance Training Performed  Yes    VAD Patient?  No    PAD/SET Patient?  No      Pain Assessment   Currently in Pain?  No/denies          Social History   Tobacco Use  Smoking Status Former Smoker  Smokeless Tobacco Former Environmental health practitioner Met:  Proper associated with RPD/PD & O2 Sat Independence with exercise equipment Using PLB without cueing & demonstrates good technique Exercise tolerated well No report of cardiac concerns or symptoms Strength training completed today  Goals Unmet:  Not Applicable  Comments: Pt able to follow exercise prescription today without complaint.  Will continue to monitor for progression.    Dr. Emily Filbert is Medical Director for [place] and [facility].

## 2018-05-14 DIAGNOSIS — J849 Interstitial pulmonary disease, unspecified: Secondary | ICD-10-CM | POA: Diagnosis not present

## 2018-05-14 NOTE — Progress Notes (Signed)
Daily Session Note  Patient Details  Name: Paul Cross MRN: [id number] Date of Birth: 1917/07/10 Referring Provider:     Pulmonary Rehab from 02/13/2018 in [facility] Cardiac and Pulmonary Rehab  Referring Provider  McQuaid      Encounter Date: 05/14/2018  Check In: Session Check In - 05/14/18 [place]      Check-In   Supervising physician immediately available to respond to emergencies  [facility] immediately available ER MD    Physician(s)  Dr. Joni Fears and Jimmye Norman    Location  [facility]-Cardiac & Pulmonary Rehab    Staff Present  Justin Mend RCP,RRT,BSRT;Amanda Oletta Darter, [place], ACSM CEP, Exercise Physiologist;Kelly Amedeo Plenty, BS, ACSM CEP, Exercise Physiologist    Medication changes reported      No    Fall or balance concerns reported     No    Warm-up and Cool-down  Performed as group-led instruction    Resistance Training Performed  Yes    VAD Patient?  No    PAD/SET Patient?  No      Pain Assessment   Currently in Pain?  No/denies          Social History   Tobacco Use  Smoking Status Former Smoker  Smokeless Tobacco Former Environmental health practitioner Met:  Independence with exercise equipment Exercise tolerated well No report of cardiac concerns or symptoms Strength training completed today  Goals Unmet:  Not Applicable  Comments: Pt able to follow exercise prescription today without complaint.  Will continue to monitor for progression.    Dr. Emily Filbert is Medical Director for [place] and [facility].

## 2018-05-16 DIAGNOSIS — J849 Interstitial pulmonary disease, unspecified: Secondary | ICD-10-CM | POA: Diagnosis not present

## 2018-05-16 NOTE — Progress Notes (Signed)
Discharge Progress Report  Patient Details  Name: EMIN FOREE MRN: [id number] Date of Birth: [birth year] Referring Provider:     Pulmonary Rehab from 02/13/2018 in [facility]  Referring Provider  McQuaid       Number of Visits: 36/36  Reason for Discharge:  Patient reached a stable level of exercise. Patient independent in their exercise. Patient has met program and personal goals.  Smoking History:  Social History   Tobacco Use  Smoking Status Former Smoker  Smokeless Tobacco Former User    Diagnosis:  ILD (interstitial lung disease) ([place])  ADL [facility]: Pulmonary Assessment Scores    Row Name 02/13/18 1256 02/16/18 1216 05/02/18 1156     ADL [facility]   ADL Phase  Entry  Entry  Exit   SOB Score total  79  -  38   Rest  3  -  0   Walk  5  -  3   Stairs  4  -  1   Bath  3  -  4   Dress  3  -  2   Shop  4  -  0     CAT Score   CAT Score  18  -  15     mMRC Score   mMRC Score  -  1  -   Row Name 05/07/18 1146         mMRC Score   mMRC Score  1        Initial Exercise Prescription: Initial Exercise Prescription - 02/13/18 1100      Date of Initial Exercise RX and Referring Provider   Date  02/13/18    Referring Provider  McQuaid      Oxygen   Oxygen  Continuous    Liters  2      Treadmill   MPH  1    Grade  0    Minutes  15    METs  1.77      NuStep   Level  2    SPM  80    Minutes  15    METs  1.5      Arm Ergometer   Level  1    Minutes  15    METs  1.5      Track   Minutes  15      Prescription Details   Frequency (times per week)  3    Duration  Progress to 45 minutes of aerobic exercise without signs/symptoms of physical distress      Intensity   THRR 40-80% of Max Heartrate  95-113    Ratings of Perceived Exertion  11-13    Perceived Dyspnea  0-4      Resistance Training   Training Prescription  Yes    Weight  2 lb    Reps  10-15       Discharge Exercise Prescription (Final Exercise  Prescription Changes): Exercise Prescription Changes - 05/09/18 1200      Response to Exercise   Blood Pressure (Admit)  124/70    Blood Pressure (Exit)  118/58    Heart Rate (Admit)  70 bpm    Heart Rate (Exercise)  78 bpm    Heart Rate (Exit)  70 bpm    Oxygen Saturation (Admit)  99 %    Oxygen Saturation (Exercise)  85 %    Oxygen Saturation (Exit)  99 %    Rating of Perceived Exertion (Exercise)  11    Perceived Dyspnea (Exercise)  1    Duration  Continue with 45 min of aerobic exercise without signs/symptoms of physical distress.    Intensity  THRR unchanged      Progression   Progression  Continue to progress workloads to maintain intensity without signs/symptoms of physical distress.    Average METs  2      Resistance Training   Training Prescription  Yes    Weight  3 lb    Reps  10-15      Interval Training   Interval Training  No      Track   Minutes  15      Home Exercise Plan   Plans to continue exercise at  Home (comment)    Frequency  Add 2 additional days to program exercise sessions.    Initial Home Exercises Provided  03/02/18       Functional Capacity: 6 Minute Walk    Row Name 02/13/18 1140 05/07/18 1141       6 Minute Walk   Phase  -  Discharge    Distance  825 feet  846 feet    Distance % Change  -  3 %    Distance Feet Change  -  21 ft    Walk Time  6 minutes  6 minutes    # of Rest Breaks  0  0    MPH  1.56  1.6    METS  2.15  -    RPE  13  13    Perceived Dyspnea   2  4    VO2 Peak  2.9  2.5    Symptoms  -  Yes (comment)    Comments  -  SOB    Resting HR  77 bpm  78 bpm    Resting BP  118/60  120/58    Resting Oxygen Saturation   96 %  90 %    Exercise Oxygen Saturation  during 6 min walk  83 %  83 %    Max Ex. HR  103 bpm  100 bpm    Max Ex. BP  138/62  124/52    2 Minute Post BP  128/68  -      Interval HR   1 Minute HR  93  78    2 Minute HR  94  71    3 Minute HR  94  78    4 Minute HR  99  -    5 Minute HR  98  -    6  Minute HR  103  100    2 Minute Post HR  95  -    Interval Heart Rate?  Yes  Yes      Interval Oxygen   Interval Oxygen?  Yes  Yes    Baseline Oxygen Saturation %  96 %  90 %    1 Minute Oxygen Saturation %  88 %  90 %    1 Minute Liters of Oxygen  2 L  4 L    2 Minute Oxygen Saturation %  89 %  86 %    2 Minute Liters of Oxygen  2 L  4 L    3 Minute Oxygen Saturation %  86 %  86 %    3 Minute Liters of Oxygen  2 L  4 L    4 Minute Oxygen Saturation %  87 %  83 %  4 Minute Liters of Oxygen  2 L  4 L    5 Minute Oxygen Saturation %  84 %  -    5 Minute Liters of Oxygen  2 L  4 L    6 Minute Oxygen Saturation %  83 %  83 %    6 Minute Liters of Oxygen  2 L  4 L    2 Minute Post Oxygen Saturation %  94 %  89 %    2 Minute Post Liters of Oxygen  2 L  4 L       Psychological, QOL, Others - Outcomes: PHQ 2/9: Depression screen [facility] 2/9 05/02/2018 04/20/2018 02/21/2018 02/08/2018  Decreased Interest _0 0  Down, Depressed, Hopeless 1 0 1 1  PHQ - 2 Score _1 Altered sleeping 2 0 1 -  Tired, decreased energy _2 -  Change in appetite 2 0 2 -  Feeling bad or failure about yourself  1 0 0 -  Trouble concentrating 0 0 0 -  Moving slowly or fidgety/restless 0 0 2 -  Suicidal thoughts 0 0 0 -  PHQ-9 Score _3 -  Difficult doing work/chores Somewhat difficult Somewhat difficult Somewhat difficult -    Quality of Life:   Personal Goals: Goals established at orientation with interventions provided to work toward goal. Personal Goals and Risk Factors at Admission - 02/13/18 1301      Core Components/Risk Factors/Patient Goals on Admission   Improve shortness of breath with ADL's  Yes    Intervention  Provide education, individualized exercise plan and daily activity instruction to help decrease symptoms of SOB with activities of daily living.    Expected Outcomes  Short Term: Improve cardiorespiratory fitness to achieve a reduction of symptoms when performing ADLs;Long Term:  Be able to perform more ADLs without symptoms or delay the onset of symptoms    Hypertension  Yes    Intervention  Provide education on lifestyle modifcations including regular physical activity/exercise, weight management, moderate sodium restriction and increased consumption of fresh fruit, vegetables, and low fat dairy, alcohol moderation, and smoking cessation.;Monitor prescription use compliance.    Expected Outcomes  Short Term: Continued assessment and intervention until BP is < 140/6m HG in hypertensive participants. < 130/82mHG in hypertensive participants with diabetes, heart failure or chronic kidney disease.;Long Term: Maintenance of blood pressure at goal levels.    Lipids  Yes    Intervention  Provide education and support for participant on nutrition & aerobic/resistive exercise along with prescribed medications to achieve LDL <7081mHDL >43m21m  Expected Outcomes  Short Term: Participant states understanding of desired cholesterol values and is compliant with medications prescribed. Participant is following exercise prescription and nutrition guidelines.;Long Term: Cholesterol controlled with medications as prescribed, with individualized exercise RX and with personalized nutrition plan. Value goals: LDL < 70mg90mL > 40 mg.        Personal Goals Discharge: Goals and Risk Factor Review    Row Name 02/28/18 1058 04/02/18 1050 04/30/18 1103         Core Components/Risk Factors/Patient Goals Review   Personal Goals Review  Improve shortness of breath with ADL's;Lipids;Hypertension  Improve shortness of breath with ADL's  Improve shortness of breath with ADL's     Review  Talked with Bug today about his goals. He does not have SOB except with extended walks.  He does well walking down his driveway and street  short distance. He is concerned that he is not walking more laps each day"my legs get tired"". Reviewed that he will be here 12 weeks nad has time to strengthen his muscles and  improve his walking distance. He verbalized understanding. No concerns with HTN and Cholesterol today.   Patient has improved with his shortness of breath. He has regular attendance in [facility] and is able to walk further than before.  He is able to get around his house a little better without getting too short of breath.  Bug states he does not do that much at home and his shortness of breath varries. His daughter does things for him and he has a person that comes in and cleans for him. He is trying to stay busy and do things for himself.     Expected Outcomes  STG: Bug continues with exercise prescription. LTG Bug sees improvement in walking distance without increased SOB  Short: attned class to further improve ADL's. Long: Graduate [facility] and continue to exercise.  Short: attened class to further improve ADL's. Long: Graduate [facility] and join a gym to exercise.        Exercise Goals and Review: Exercise Goals    Row Name 02/13/18 1139             Exercise Goals   Increase Physical Activity  Yes       Intervention  Provide advice, education, support and counseling about physical activity/exercise needs.;Develop an individualized exercise prescription for aerobic and resistive training based on initial evaluation findings, risk stratification, comorbidities and participant's personal goals.       Expected Outcomes  Short Term: Attend rehab on a regular basis to increase amount of physical activity.;Long Term: Add in home exercise to make exercise part of routine and to increase amount of physical activity.;Long Term: Exercising regularly at least 3-5 days a week.       Increase Strength and Stamina  Yes       Intervention  Provide advice, education, support and counseling about physical activity/exercise needs.;Develop an individualized exercise prescription for aerobic and resistive training based on initial evaluation findings, risk stratification, comorbidities and participant's personal  goals.       Expected Outcomes  Short Term: Increase workloads from initial exercise prescription for resistance, speed, and METs.;Short Term: Perform resistance training exercises routinely during rehab and add in resistance training at home;Long Term: Improve cardiorespiratory fitness, muscular endurance and strength as measured by increased METs and functional capacity (6MWT)       Able to understand and use rate of perceived exertion (RPE) scale  Yes       Intervention  Provide education and explanation on how to use RPE scale       Expected Outcomes  Short Term: Able to use RPE daily in rehab to express subjective intensity level;Long Term:  Able to use RPE to guide intensity level when exercising independently       Able to understand and use Dyspnea scale  Yes       Intervention  Provide education and explanation on how to use Dyspnea scale       Expected Outcomes  Short Term: Able to use Dyspnea scale daily in rehab to express subjective sense of shortness of breath during exertion;Long Term: Able to use Dyspnea scale to guide intensity level when exercising independently       Knowledge and understanding of Target Heart Rate Range (THRR)  Yes       Intervention  Provide education and explanation of THRR including how the numbers were predicted and where they are located for reference       Expected Outcomes  Short Term: Able to state/look up THRR;Short Term: Able to use daily as guideline for intensity in rehab;Long Term: Able to use THRR to govern intensity when exercising independently       Able to check pulse independently  Yes       Intervention  Provide education and demonstration on how to check pulse in carotid and radial arteries.;Review the importance of being able to check your own pulse for safety during independent exercise       Expected Outcomes  Short Term: Able to explain why pulse checking is important during independent exercise;Long Term: Able to check pulse independently  and accurately       Understanding of Exercise Prescription  Yes       Intervention  Provide education, explanation, and written materials on patient's individual exercise prescription       Expected Outcomes  Short Term: Able to explain program exercise prescription;Long Term: Able to explain home exercise prescription to exercise independently          Exercise Goals Re-Evaluation: Exercise Goals Re-Evaluation    Row Name 02/16/18 1018 03/01/18 0844 03/02/18 1117 03/15/18 1030 03/28/18 1225     Exercise Goal Re-Evaluation   Exercise Goals Review  Understanding of Exercise Prescription;Able to understand and use rate of perceived exertion (RPE) scale;Able to understand and use Dyspnea scale;Knowledge and understanding of Target Heart Rate Range (THRR)  Increase Physical Activity;Increase Strength and Stamina;Able to understand and use rate of perceived exertion (RPE) scale;Able to understand and use Dyspnea scale  Increase Physical Activity;Able to understand and use rate of perceived exertion (RPE) scale;Knowledge and understanding of Target Heart Rate Range (THRR);Understanding of Exercise Prescription;Increase Strength and Stamina;Able to understand and use Dyspnea scale  Increase Physical Activity;Increase Strength and Stamina;Able to understand and use rate of perceived exertion (RPE) scale;Able to understand and use Dyspnea scale;Knowledge and understanding of Target Heart Rate Range (THRR)  Increase Physical Activity;Increase Strength and Stamina;Able to understand and use rate of perceived exertion (RPE) scale;Able to understand and use Dyspnea scale   Comments  Reviewed RPE scale, THR and program prescription with pt today.  Pt voiced understanding and was given a copy of goals to take home.   Bug is tolerating exercise very well.  He did 25 laps walking and has increased strength training to 3 lb.  Reviewed home exercise with pt today.  Pt plans to walk and consider [place] for exercise.   Reviewed THR, pulse, RPE, sign and symptoms, NTG use, and when to call 911 or MD.  Also discussed weather considerations and indoor options.  Pt voiced understanding.  Bug tolerates exercise well.  He walks 24-29 laps with his 02 tank most days.  He has some days he is fatigued from other activities.    Bug is progressing well with exercise.  He is practicing PLB and not open mouth breathing to help maintain 02 sats above 88.     Expected Outcomes  Short: Use RPE daily to regulate intensity. Long: Follow program prescription in THR.  Short - attend class consistently Long - increase MET level  Short - walk on days not at [employer] - join [employer] to maintain exercise  Short - continue to attend consistently Long - increase overall MET level  Short - continue to attend consistently Long - increase  MET level   Row Name 04/11/18 1448 04/26/18 0842 05/09/18 1304         Exercise Goal Re-Evaluation   Exercise Goals Review  Increase Physical Activity;Increase Strength and Stamina;Able to understand and use rate of perceived exertion (RPE) scale;Able to understand and use Dyspnea scale  Increase Physical Activity;Increase Strength and Stamina;Able to understand and use rate of perceived exertion (RPE) scale;Able to understand and use Dyspnea scale;Knowledge and understanding of Target Heart Rate Range (THRR)  Increase Physical Activity;Increase Strength and Stamina;Able to understand and use rate of perceived exertion (RPE) scale;Able to understand and use Dyspnea scale;Knowledge and understanding of Target Heart Rate Range (THRR);Understanding of Exercise Prescription     Comments  Bug tolerates exercise very well.  He walks the track with 02 and does arm crank and NuStep with no complaints.  Bug attends consistently.  His METs have been lower on NS and AC, possibly due to slowing when staff asks RPE.  He does very well walking except a few occasions where his 02 dropped.  Staff will monitor progress.    Bug has  increased his stamina and completes all strength work much better than at the beginning.  He plans to continue in [employer].     Expected Outcomes  Short - continue to attend consistently Long - Maintain exercise on his own  Short - attend consistently Long - maintain exercise on his own  Short - complete LW program Long - maintain exercise in FF        Nutrition & Weight - Outcomes: Pre Biometrics - 02/13/18 1138      Pre Biometrics   Height  6' (1.829 m)    Weight  189 lb 1.6 oz (85.8 kg)    Waist Circumference  37.75 inches    Hip Circumference  42 inches    Waist to Hip Ratio  0.9 %    BMI (Calculated)  25.64      Post Biometrics - 05/07/18 1140       Post  Biometrics   Height  6' (1.829 m)    Weight  192 lb (87.1 kg)    Waist Circumference  39 inches    Hip Circumference  42 inches    Waist to Hip Ratio  0.93 %    BMI (Calculated)  26.03       Nutrition: Nutrition Therapy & Goals - 02/21/18 1044      Nutrition Therapy   Diet  TLC    Protein (specify units)  8oz    Fiber  30 grams    Whole Grain Foods  3 servings   does not always choose whole grains   Saturated Fats  14 max. grams    Fruits and Vegetables  6 servings/day   8 ideal; eats a variety of fruits and vegetables but does not have a large appetite   Sodium  2000 grams      Personal Nutrition Goals   Nutrition Goal  Continue to drink three 8oz Ensure nutritional drinks daily between meals. However, if you find that you are too full to eat at meal times, consider drinking one less per day to prioritize whole food nutrition    Personal Goal #2  Maintain current diet variety and prioritize fluids daily, ideally drinking more water than sweet tea    Comments  He eats 3 meals per day and chooses fruit and occasionally ice cream as snacks. He usually has cereal and OJ for breakfast, a sandwich or  salad for lunch, and his dinners are provided by his daughter. Reports appetite to have decreased over the past year.  Ensure supplements are provided by the [place], educate and counsel regarding individualized specific dietary modifications aiming towards targeted core components such as weight, hypertension, lipid management, diabetes, heart failure and other comorbidities.    Expected Outcomes  Short Term Goal: A plan has been developed with personal nutrition goals set during dietitian appointment.;Long Term Goal: Adherence to prescribed nutrition plan.;Short Term Goal: Understand basic principles of dietary content, such as calories, fat, sodium, cholesterol and nutrients.       Nutrition Discharge:   Education Questionnaire Score: Knowledge Questionnaire Score - 05/02/18 1157      Knowledge Questionnaire Score   Pre Score  13/18    Post Score  12/18   reviewed with patient      Goals reviewed with patient; copy given to patient.

## 2018-05-16 NOTE — Patient Instructions (Signed)
Discharge Patient Instructions  Patient Details  Name: Paul Cross MRN: [id number] Date of Birth: 1918-05-19 Referring Provider:  Juanito Doom, MD  Number of Visits: 36/36  Reason for Discharge:  Patient reached a stable level of exercise. Patient independent in their exercise. Patient has met program and personal goals.  Smoking History:  Social History   Tobacco Use  Smoking Status Former Smoker  Smokeless Tobacco Former User    Diagnosis:  ILD (interstitial lung disease) ([place])  Initial Exercise Prescription: Initial Exercise Prescription - 02/13/18 1100      Date of Initial Exercise RX and Referring Provider   Date  02/13/18    Referring Provider  McQuaid      Oxygen   Oxygen  Continuous    Liters  2      Treadmill   MPH  1    Grade  0    Minutes  15    METs  1.77      NuStep   Level  2    SPM  80    Minutes  15    METs  1.5      Arm Ergometer   Level  1    Minutes  15    METs  1.5      Track   Minutes  15      Prescription Details   Frequency (times per week)  3    Duration  Progress to 45 minutes of aerobic exercise without signs/symptoms of physical distress      Intensity   THRR 40-80% of Max Heartrate  95-113    Ratings of Perceived Exertion  11-13    Perceived Dyspnea  0-4      Resistance Training   Training Prescription  Yes    Weight  2 lb    Reps  10-15       Discharge Exercise Prescription (Final Exercise Prescription Changes): Exercise Prescription Changes - 05/09/18 1200      Response to Exercise   Blood Pressure (Admit)  124/70    Blood Pressure (Exit)  118/58    Heart Rate (Admit)  70 bpm    Heart Rate (Exercise)  78 bpm    Heart Rate (Exit)  70 bpm    Oxygen Saturation (Admit)  99 %    Oxygen Saturation (Exercise)  85 %    Oxygen Saturation (Exit)  99 %    Rating of Perceived Exertion (Exercise)  11    Perceived Dyspnea (Exercise)  1    Duration  Continue with 45 min of aerobic exercise without  signs/symptoms of physical distress.    Intensity  THRR unchanged      Progression   Progression  Continue to progress workloads to maintain intensity without signs/symptoms of physical distress.    Average METs  2      Resistance Training   Training Prescription  Yes    Weight  3 lb    Reps  10-15      Interval Training   Interval Training  No      Track   Minutes  15      Home Exercise Plan   Plans to continue exercise at  Home (comment)    Frequency  Add 2 additional days to program exercise sessions.    Initial Home Exercises Provided  03/02/18       Functional Capacity: 6 Minute Walk    Row Name 02/13/18 1140 05/07/18 1141  6 Minute Walk   Phase  -  Discharge    Distance  825 feet  846 feet    Distance % Change  -  3 %    Distance Feet Change  -  21 ft    Walk Time  6 minutes  6 minutes    # of Rest Breaks  0  0    MPH  1.56  1.6    METS  2.15  -    RPE  13  13    Perceived Dyspnea   2  4    VO2 Peak  2.9  2.5    Symptoms  -  Yes (comment)    Comments  -  SOB    Resting HR  77 bpm  78 bpm    Resting BP  118/60  120/58    Resting Oxygen Saturation   96 %  90 %    Exercise Oxygen Saturation  during 6 min walk  83 %  83 %    Max Ex. HR  103 bpm  100 bpm    Max Ex. BP  138/62  124/52    2 Minute Post BP  128/68  -      Interval HR   1 Minute HR  93  78    2 Minute HR  94  71    3 Minute HR  94  78    4 Minute HR  99  -    5 Minute HR  98  -    6 Minute HR  103  100    2 Minute Post HR  95  -    Interval Heart Rate?  Yes  Yes      Interval Oxygen   Interval Oxygen?  Yes  Yes    Baseline Oxygen Saturation %  96 %  90 %    1 Minute Oxygen Saturation %  88 %  90 %    1 Minute Liters of Oxygen  2 L  4 L    2 Minute Oxygen Saturation %  89 %  86 %    2 Minute Liters of Oxygen  2 L  4 L    3 Minute Oxygen Saturation %  86 %  86 %    3 Minute Liters of Oxygen  2 L  4 L    4 Minute Oxygen Saturation %  87 %  83 %    4 Minute Liters of Oxygen  2 L   4 L    5 Minute Oxygen Saturation %  84 %  -    5 Minute Liters of Oxygen  2 L  4 L    6 Minute Oxygen Saturation %  83 %  83 %    6 Minute Liters of Oxygen  2 L  4 L    2 Minute Post Oxygen Saturation %  94 %  89 %    2 Minute Post Liters of Oxygen  2 L  4 L      Quality of Life:  Personal Goals: Goals established at orientation with interventions provided to work toward goal. Personal Goals and Risk Factors at Admission - 02/13/18 1301      Core Components/Risk Factors/Patient Goals on Admission   Improve shortness of breath with ADL's  Yes    Intervention  Provide education, individualized exercise plan and daily activity instruction to help decrease symptoms of SOB with activities of daily living.  Expected Outcomes  Short Term: Improve cardiorespiratory fitness to achieve a reduction of symptoms when performing ADLs;Long Term: Be able to perform more ADLs without symptoms or delay the onset of symptoms    Hypertension  Yes    Intervention  Provide education on lifestyle modifcations including regular physical activity/exercise, weight management, moderate sodium restriction and increased consumption of fresh fruit, vegetables, and low fat dairy, alcohol moderation, and smoking cessation.;Monitor prescription use compliance.    Expected Outcomes  Short Term: Continued assessment and intervention until BP is < 140/61m HG in hypertensive participants. < 130/822mHG in hypertensive participants with diabetes, heart failure or chronic kidney disease.;Long Term: Maintenance of blood pressure at goal levels.    Lipids  Yes    Intervention  Provide education and support for participant on nutrition & aerobic/resistive exercise along with prescribed medications to achieve LDL <707mHDL >59m58m  Expected Outcomes  Short Term: Participant states understanding of desired cholesterol values and is compliant with medications prescribed. Participant is following exercise prescription and nutrition  guidelines.;Long Term: Cholesterol controlled with medications as prescribed, with individualized exercise RX and with personalized nutrition plan. Value goals: LDL < 70mg64mL > 40 mg.       Personal Goals Discharge: Goals and Risk Factor Review - 04/30/18 1103      Core Components/Risk Factors/Patient Goals Review   Personal Goals Review  Improve shortness of breath with ADL's    Review  Bug states he does not do that much at home and his shortness of breath varries. His daughter does things for him and he has a person that comes in and cleans for him. He is trying to stay busy and do things for himself.    Expected Outcomes  Short: attened class to further improve ADL's. Long: Graduate [employer] and join a gym to exercise.       Exercise Goals and Review: Exercise Goals    Row Name 02/13/18 1139             Exercise Goals   Increase Physical Activity  Yes       Intervention  Provide advice, education, support and counseling about physical activity/exercise needs.;Develop an individualized exercise prescription for aerobic and resistive training based on initial evaluation findings, risk stratification, comorbidities and participant's personal goals.       Expected Outcomes  Short Term: Attend rehab on a regular basis to increase amount of physical activity.;Long Term: Add in home exercise to make exercise part of routine and to increase amount of physical activity.;Long Term: Exercising regularly at least 3-5 days a week.       Increase Strength and Stamina  Yes       Intervention  Provide advice, education, support and counseling about physical activity/exercise needs.;Develop an individualized exercise prescription for aerobic and resistive training based on initial evaluation findings, risk stratification, comorbidities and participant's personal goals.       Expected Outcomes  Short Term: Increase workloads from initial exercise prescription for resistance, speed, and METs.;Short  Term: Perform resistance training exercises routinely during rehab and add in resistance training at home;Long Term: Improve cardiorespiratory fitness, muscular endurance and strength as measured by increased METs and functional capacity (6MWT)       Able to understand and use rate of perceived exertion (RPE) scale  Yes       Intervention  Provide education and explanation on how to use RPE scale       Expected Outcomes  Short Term:  Able to use RPE daily in rehab to express subjective intensity level;Long Term:  Able to use RPE to guide intensity level when exercising independently       Able to understand and use Dyspnea scale  Yes       Intervention  Provide education and explanation on how to use Dyspnea scale       Expected Outcomes  Short Term: Able to use Dyspnea scale daily in rehab to express subjective sense of shortness of breath during exertion;Long Term: Able to use Dyspnea scale to guide intensity level when exercising independently       Knowledge and understanding of Target Heart Rate Range (THRR)  Yes       Intervention  Provide education and explanation of THRR including how the numbers were predicted and where they are located for reference       Expected Outcomes  Short Term: Able to state/look up THRR;Short Term: Able to use daily as guideline for intensity in rehab;Long Term: Able to use THRR to govern intensity when exercising independently       Able to check pulse independently  Yes       Intervention  Provide education and demonstration on how to check pulse in carotid and radial arteries.;Review the importance of being able to check your own pulse for safety during independent exercise       Expected Outcomes  Short Term: Able to explain why pulse checking is important during independent exercise;Long Term: Able to check pulse independently and accurately       Understanding of Exercise Prescription  Yes       Intervention  Provide education, explanation, and written materials  on patient's individual exercise prescription       Expected Outcomes  Short Term: Able to explain program exercise prescription;Long Term: Able to explain home exercise prescription to exercise independently         Exercise Goals Re-Evaluation: Exercise Goals Re-Evaluation    Row Name 02/16/18 1018 03/01/18 0844 03/02/18 1117 03/15/18 1030 03/28/18 1225     Exercise Goal Re-Evaluation   Exercise Goals Review  Understanding of Exercise Prescription;Able to understand and use rate of perceived exertion (RPE) scale;Able to understand and use Dyspnea scale;Knowledge and understanding of Target Heart Rate Range (THRR)  Increase Physical Activity;Increase Strength and Stamina;Able to understand and use rate of perceived exertion (RPE) scale;Able to understand and use Dyspnea scale  Increase Physical Activity;Able to understand and use rate of perceived exertion (RPE) scale;Knowledge and understanding of Target Heart Rate Range (THRR);Understanding of Exercise Prescription;Increase Strength and Stamina;Able to understand and use Dyspnea scale  Increase Physical Activity;Increase Strength and Stamina;Able to understand and use rate of perceived exertion (RPE) scale;Able to understand and use Dyspnea scale;Knowledge and understanding of Target Heart Rate Range (THRR)  Increase Physical Activity;Increase Strength and Stamina;Able to understand and use rate of perceived exertion (RPE) scale;Able to understand and use Dyspnea scale   Comments  Reviewed RPE scale, THR and program prescription with pt today.  Pt voiced understanding and was given a copy of goals to take home.   Bug is tolerating exercise very well.  He did 25 laps walking and has increased strength training to 3 lb.  Reviewed home exercise with pt today.  Pt plans to walk and consider [place] for exercise.  Reviewed THR, pulse, RPE, sign and symptoms, NTG use, and when to call 911 or MD.  Also discussed weather considerations and indoor options.   Pt voiced understanding.  Bug tolerates exercise well.  He walks 24-29 laps with his 02 tank most days.  He has some days he is fatigued from other activities.    Bug is progressing well with exercise.  He is practicing PLB and not open mouth breathing to help maintain 02 sats above 88.     Expected Outcomes  Short: Use RPE daily to regulate intensity. Long: Follow program prescription in THR.  Short - attend class consistently Long - increase MET level  Short - walk on days not at [place] to maintain exercise  Short - continue to attend consistently Long - increase overall MET level  Short - continue to attend consistently Long - increase MET level   Row Name 04/11/18 1448 04/26/18 0842 05/09/18 1304         Exercise Goal Re-Evaluation   Exercise Goals Review  Increase Physical Activity;Increase Strength and Stamina;Able to understand and use rate of perceived exertion (RPE) scale;Able to understand and use Dyspnea scale  Increase Physical Activity;Increase Strength and Stamina;Able to understand and use rate of perceived exertion (RPE) scale;Able to understand and use Dyspnea scale;Knowledge and understanding of Target Heart Rate Range (THRR)  Increase Physical Activity;Increase Strength and Stamina;Able to understand and use rate of perceived exertion (RPE) scale;Able to understand and use Dyspnea scale;Knowledge and understanding of Target Heart Rate Range (THRR);Understanding of Exercise Prescription     Comments  Bug tolerates exercise very well.  He walks the track with 02 and does arm crank and NuStep with no complaints.  Bug attends consistently.  His METs have been lower on NS and AC, possibly due to slowing when staff asks RPE.  He does very well walking except a few occasions where his 02 dropped.  Staff will monitor progress.    Bug has increased his stamina and completes all strength work much better than at the beginning.  He plans to continue in [employer].     Expected  Outcomes  Short - continue to attend consistently Long - Maintain exercise on his own  Short - attend consistently Long - maintain exercise on his own  Short - complete LW program Long - maintain exercise in FF       Nutrition & Weight - Outcomes: Pre Biometrics - 02/13/18 1138      Pre Biometrics   Height  6' (1.829 m)    Weight  189 lb 1.6 oz (85.8 kg)    Waist Circumference  37.75 inches    Hip Circumference  42 inches    Waist to Hip Ratio  0.9 %    BMI (Calculated)  25.64      Post Biometrics - 05/07/18 1140       Post  Biometrics   Height  6' (1.829 m)    Weight  192 lb (87.1 kg)    Waist Circumference  39 inches    Hip Circumference  42 inches    Waist to Hip Ratio  0.93 %    BMI (Calculated)  26.03      Nutrition: Nutrition Therapy & Goals - 02/21/18 1044      Nutrition Therapy   Diet  TLC    Protein (specify units)  8oz    Fiber  30 grams    Whole Grain Foods  3 servings   does not always choose whole grains   Saturated Fats  14 max. grams    Fruits and Vegetables  6 servings/day   8 ideal; eats  a variety of fruits and vegetables but does not have a large appetite   Sodium  2000 grams      Personal Nutrition Goals   Nutrition Goal  Continue to drink three 8oz Ensure nutritional drinks daily between meals. However, if you find that you are too full to eat at meal times, consider drinking one less per day to prioritize whole food nutrition    Personal Goal #2  Maintain current diet variety and prioritize fluids daily, ideally drinking more water than sweet tea    Comments  He eats 3 meals per day and chooses fruit and occasionally ice cream as snacks. He usually has cereal and OJ for breakfast, a sandwich or salad for lunch, and his dinners are provided by his daughter. Reports appetite to have decreased over the past year. Ensure supplements are provided by the [place], educate and counsel regarding individualized  specific dietary modifications aiming towards targeted core components such as weight, hypertension, lipid management, diabetes, heart failure and other comorbidities.    Expected Outcomes  Short Term Goal: A plan has been developed with personal nutrition goals set during dietitian appointment.;Long Term Goal: Adherence to prescribed nutrition plan.;Short Term Goal: Understand basic principles of dietary content, such as calories, fat, sodium, cholesterol and nutrients.      Nutrition Discharge:  Education Questionnaire Score: Knowledge Questionnaire Score - 05/02/18 1157      Knowledge Questionnaire Score   Pre Score  13/18    Post Score  12/18   reviewed with patient      Goals reviewed with patient; copy given to patient.

## 2018-05-16 NOTE — Progress Notes (Signed)
Thanks for passing along!

## 2018-05-16 NOTE — Progress Notes (Signed)
Pulmonary Individual Treatment Plan  Patient Details  Name: LYRICK LAGRAND MRN: [id number] Date of Birth: [birth year] Referring Provider:     Pulmonary Rehab from 02/13/2018 in [facility] Re[facility]  Referring Provider  McQuaid      Initial Encounter Date:    Pulmonary Rehab from 02/13/2018 in [facility] Cardiac and Pulmonary Rehab  Date  02/13/18      Visit Diagnosis: ILD (interstitial lung disease) ([place])  Patient's Home Medications on Admission:  Current Outpatient Medications:  .  acetaminophen (TYLENOL) 325 MG tablet, Take 975 mg by mouth 3 (three) times daily as needed for mild pain, moderate pain, fever or headache., Disp: , Rfl:  .  amLODipine (NORVASC) 5 MG tablet, Take 5 mg by mouth daily., Disp: , Rfl:  .  aspirin EC 81 MG tablet, Take 81 mg by mouth daily., Disp: , Rfl:  .  atorvastatin (LIPITOR) 40 MG tablet, Take 20 mg by mouth at bedtime., Disp: , Rfl:  .  budesonide-formoterol (SYMBICORT) 160-4.5 MCG/ACT inhaler, Inhale 2 puffs into the lungs 2 (two) times daily., Disp: , Rfl:  .  cholecalciferol (VITAMIN D) 1000 units tablet, Take 1,000 Units by mouth daily., Disp: , Rfl:  .  hydrocortisone cream 1 %, Apply 1 application topically 2 (two) times daily. FOR TAILBONE, Disp: , Rfl:  .  ipratropium-albuterol (DUONEB) 0.5-2.5 (3) MG/3ML SOLN, Take 3 mLs by nebulization 3 (three) times daily., Disp: 360 mL, Rfl: 3 .  lactulose (CEPHULAC) 10 g packet, Take 1 packet (10 g total) by mouth at bedtime., Disp: 30 each, Rfl: 0 .  levothyroxine (SYNTHROID, LEVOTHROID) 137 MCG tablet, Take 137 mcg by mouth daily before breakfast. , Disp: , Rfl:  .  Multiple Vitamin (MULTIVITAMIN) capsule, Take 1 capsule by mouth daily., Disp: , Rfl:  .  tamsulosin (FLOMAX) 0.4 MG CAPS capsule, Take 0.4 mg by mouth daily., Disp: , Rfl:  .  tiotropium (SPIRIVA HANDIHALER) 18 MCG inhalation capsule, Place 1 capsule (18 mcg total) into inhaler and inhale daily., Disp: 30 capsule, Rfl: 11 .   Tiotropium Bromide Monohydrate (SPIRIVA RESPIMAT) 2.5 MCG/ACT AERS, Inhale 2 puffs into the lungs daily., Disp: 1 Inhaler, Rfl: 11 .  vitamin B-12 (CYANOCOBALAMIN) 1000 MCG tablet, Take 1,000 mcg by mouth daily., Disp: , Rfl:  No current facility-administered medications for this visit.   Facility-Administered Medications Ordered in Other Visits:  .  albuterol (PROVENTIL) (2.5 MG/3ML) 0.083% nebulizer solution 2.5 mg, 2.5 mg, Nebulization, Once, Juanito Doom, MD  Past Medical History: Past Medical History:  Diagnosis Date  . Hyperlipidemia   . Hypertension   . 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 [employer] Cardiac and Pulmonary Rehab Latest Ref Rng & Units 01/01/2018 02/08/2018   Hemoglobin A1c 4.6 - 6.5 % - 5.9   PHART 7.350 - 7.450 7.45 -   PCO2ART 32.0 - 48.0 mmHg 36 -   HCO3 20.0 - 28.0 mmol/L 25.0 -   O2SAT % 89.7 -       Pulmonary Assessment Scores: Pulmonary Assessment Scores    Row Name 02/13/18 1256 02/16/18 1216 05/02/18 1156     ADL UCSD   ADL Phase  Entry  Entry  Exit   SOB Score total  79  -  38   Rest  3  -  0   Walk  5  -  3   Stairs  4  -  1   Bath  3  -  4   Dress  3  -  2   Shop  4  -  0     CAT Score   CAT Score  18  -  15     mMRC Score   mMRC Score  -  1  -   Row Name 05/07/18 1146         mMRC Score   mMRC Score  1        Pulmonary Function Assessment: Pulmonary Function Assessment - 02/13/18 1255      Breath   Shortness of Breath  Yes;Limiting activity       Exercise Target Goals: Exercise Program Goal: Individual exercise prescription set using results from initial 6 min walk test and THRR while considering  patient's activity barriers and safety.   Exercise Prescription Goal: Initial exercise prescription builds to 30-45 minutes a day of aerobic activity, 2-3 days  per week.  Home exercise guidelines will be given to patient during program as part of exercise prescription that the participant will acknowledge.  Activity Barriers & Risk Stratification: Activity Barriers & Cardiac Risk Stratification - 02/13/18 1259      Activity Barriers & Cardiac Risk Stratification   Activity Barriers  Assistive Device;Shortness of Breath       6 Minute Walk: 6 Minute Walk    Row Name 02/13/18 1140 05/07/18 1141       6 Minute Walk   Phase  -  Discharge    Distance  825 feet  846 feet    Distance % Change  -  3 %    Distance Feet Change  -  21 ft    Walk Time  6 minutes  6 minutes    # of Rest Breaks  0  0    MPH  1.56  1.6    METS  2.15  -    RPE  13  13    Perceived Dyspnea   2  4    VO2 Peak  2.9  2.5    Symptoms  -  Yes (comment)    Comments  -  SOB    Resting HR  77 bpm  78 bpm    Resting BP  118/60  120/58    Resting Oxygen Saturation   96 %  90 %    Exercise Oxygen Saturation  during 6 min walk  83 %  83 %    Max Ex. HR  103 bpm  100 bpm    Max Ex. BP  138/62  124/52    2 Minute Post BP  128/68  -      Interval HR   1 Minute HR  93  78    2 Minute HR  94  71    3 Minute HR  94  78    4 Minute HR  99  -    5 Minute HR  98  -    6 Minute HR  103  100    2 Minute Post HR  95  -    Interval Heart Rate?  Yes  Yes      Interval Oxygen   Interval Oxygen?  Yes  Yes    Baseline Oxygen Saturation %  96 %  90 %    1 Minute Oxygen Saturation %  88 %  90 %    1 Minute Liters of Oxygen  2 L  4 L    2 Minute Oxygen Saturation %  89 %  86 %    2 Minute Liters of Oxygen  2 L  4 L    3 Minute Oxygen Saturation %  86 %  86 %    3 Minute Liters of Oxygen  2 L  4 L    4 Minute Oxygen Saturation %  87 %  83 %    4 Minute Liters of Oxygen  2 L  4 L    5 Minute Oxygen Saturation %  84 %  -    5 Minute Liters of Oxygen  2 L  4 L    6 Minute Oxygen Saturation %  83 %  83 %    6 Minute Liters of Oxygen  2 L  4 L    2 Minute Post Oxygen Saturation %   94 %  89 %    2 Minute Post Liters of Oxygen  2 L  4 L      Oxygen Initial Assessment: Oxygen Initial Assessment - 02/13/18 1254      Home Oxygen   Home Oxygen Device  Home Concentrator;E-Tanks    Sleep Oxygen Prescription  Continuous    Liters per minute  2    Home Exercise Oxygen Prescription  Continuous    Liters per minute  2    Home at Rest Exercise Oxygen Prescription  Continuous    Liters per minute  2    Compliance with Home Oxygen Use  Yes      Initial 6 min Walk   Oxygen Used  Continuous    Liters per minute  2      Program Oxygen Prescription   Program Oxygen Prescription  Continuous    Liters per minute  2      Intervention   Short Term Goals  To learn and exhibit compliance with exercise, home and travel O2 prescription;To learn and understand importance of monitoring SPO2 with pulse oximeter and demonstrate accurate use of the pulse oximeter.;To learn and understand importance of maintaining oxygen saturations>88%;To learn and demonstrate proper pursed lip breathing techniques or other breathing techniques.;To learn and demonstrate proper use of respiratory medications    Long  Term Goals  Exhibits compliance with exercise, home and travel O2 prescription;Verbalizes importance of monitoring SPO2 with pulse oximeter and return demonstration;Maintenance of O2 saturations>88%;Exhibits proper breathing techniques, such as pursed lip breathing or other method taught during program session;Compliance with respiratory medication;Demonstrates proper use of MDI's       Oxygen Re-Evaluation: Oxygen Re-Evaluation    Row Name 02/16/18 1019 02/28/18 1103 04/02/18 1043 04/30/18 1058       Program Oxygen Prescription   Program Oxygen Prescription  Continuous  Continuous;E-Tanks  Continuous;E-Tanks  Continuous;E-Tanks    Liters per minute  2  -  4  4      Home Oxygen   Home Oxygen Device  Home Concentrator;E-Tanks  Home Concentrator;E-Tanks  Home Concentrator;E-Tanks  Home  Concentrator;E-Tanks    Sleep Oxygen Prescription  Continuous  Continuous  Continuous  Continuous    Liters per minute  '2  2  2  2    '$ Home Exercise Oxygen Prescription  Continuous  Continuous  Continuous  Continuous    Liters per minute  '2  2  2  2    '$ Home at Rest Exercise Oxygen Prescription  Continuous  Continuous  Continuous  Continuous    Liters per minute  '2  2  2  2    '$ Compliance with Home Oxygen Use  Yes  -  Yes  Yes      Goals/Expected Outcomes   Short Term Goals  To learn and exhibit compliance with exercise, home and travel O2 prescription;To learn and understand importance of monitoring SPO2 with pulse oximeter and demonstrate accurate use of the pulse oximeter.;To learn and understand importance of maintaining oxygen saturations>88%;To learn and demonstrate proper pursed lip breathing techniques or other breathing techniques.;To learn and demonstrate proper use of respiratory medications  To learn and exhibit compliance with exercise, home and travel O2 prescription;To learn and understand importance of monitoring SPO2 with pulse oximeter and demonstrate accurate use of the pulse oximeter.;To learn and understand importance of maintaining oxygen saturations>88%;To learn and demonstrate proper pursed lip breathing techniques or other breathing techniques.;To learn and demonstrate proper use of respiratory medications  To learn and exhibit compliance with exercise, home and travel O2 prescription;To learn and understand importance of monitoring SPO2 with pulse oximeter and demonstrate accurate use of the pulse oximeter.;To learn and understand importance of maintaining oxygen saturations>88%;To learn and demonstrate proper pursed lip breathing techniques or other breathing techniques.;To learn and demonstrate proper use of respiratory medications  To learn and exhibit compliance with exercise, home and travel O2 prescription;To learn and understand importance of monitoring SPO2 with pulse  oximeter and demonstrate accurate use of the pulse oximeter.;To learn and understand importance of maintaining oxygen saturations>88%;To learn and demonstrate proper pursed lip breathing techniques or other breathing techniques.;To learn and demonstrate proper use of respiratory medications    Long  Term Goals  Exhibits compliance with exercise, home and travel O2 prescription;Verbalizes importance of monitoring SPO2 with pulse oximeter and return demonstration;Maintenance of O2 saturations>88%;Exhibits proper breathing techniques, such as pursed lip breathing or other method taught during program session;Compliance with respiratory medication;Demonstrates proper use of MDI's  Exhibits compliance with exercise, home and travel O2 prescription;Verbalizes importance of monitoring SPO2 with pulse oximeter and return demonstration;Maintenance of O2 saturations>88%;Exhibits proper breathing techniques, such as pursed lip breathing or other method taught during program session;Compliance with respiratory medication  Exhibits compliance with exercise, home and travel O2 prescription;Verbalizes importance of monitoring SPO2 with pulse oximeter and return demonstration;Maintenance of O2 saturations>88%;Exhibits proper breathing techniques, such as pursed lip breathing or other method taught during program session;Compliance with respiratory medication  Exhibits compliance with exercise, home and travel O2 prescription;Verbalizes importance of monitoring SPO2 with pulse oximeter and return demonstration;Maintenance of O2 saturations>88%;Exhibits proper breathing techniques, such as pursed lip breathing or other method taught during program session;Compliance with respiratory medication    Comments  Reviewed PLB technique with pt.  Talked about how it work and it's important to maintaining his exercise saturations.    Reviewed SPO2 readings with Bug. He verbalized understanding of >90 resting and >88 with exercise. He  stated that he sees it drop to 85 when he is active sometimes so he stops and purselipped breathes to increase the level back to 90 or above.   Patient checks his oxygen at home routinely. He is trying to get used to PLB more and still has some work to do. We discussed oxygen and his workloads. His stamina has improved since the start of the program. He is taking his medications everyday and is compliant.  Bug has been taking his inhalers at home. He has been taking spiriva and albuterol. He has no  questions on his medications. His oxygen has been within normal limits on 4 liters while exercising. He wears 2 or 3 liters at homw when he is resting. Patient has no other questions about his oxygen.     Goals/Expected Outcomes  Short: Become more profiecient at using PLB.   Long: Become independent at using PLB.  STG: continues to use his PLB techniques to keep O2 sat above 88%, uses his nebulizer correctly. LTG Using PLB technique without coaching when saturation drops below 88%, maintains proper use of nebulizer.    Short: continue [employer] to increase stamina and SOB. Long: be independent with PLB while walking.  Short: increase loads while maintaining oxygen levels. Long: maintain oxygen saturation above 88 percent until the end of the program.       Oxygen Discharge (Final Oxygen Re-Evaluation): Oxygen Re-Evaluation - 04/30/18 1058      Program Oxygen Prescription   Program Oxygen Prescription  Continuous;E-Tanks    Liters per minute  4      Home Oxygen   Home Oxygen Device  Home Concentrator;E-Tanks    Sleep Oxygen Prescription  Continuous    Liters per minute  2    Home Exercise Oxygen Prescription  Continuous    Liters per minute  2    Home at Rest Exercise Oxygen Prescription  Continuous    Liters per minute  2    Compliance with Home Oxygen Use  Yes      Goals/Expected Outcomes   Short Term Goals  To learn and exhibit compliance with exercise, home and travel O2 prescription;To learn  and understand importance of monitoring SPO2 with pulse oximeter and demonstrate accurate use of the pulse oximeter.;To learn and understand importance of maintaining oxygen saturations>88%;To learn and demonstrate proper pursed lip breathing techniques or other breathing techniques.;To learn and demonstrate proper use of respiratory medications    Long  Term Goals  Exhibits compliance with exercise, home and travel O2 prescription;Verbalizes importance of monitoring SPO2 with pulse oximeter and return demonstration;Maintenance of O2 saturations>88%;Exhibits proper breathing techniques, such as pursed lip breathing or other method taught during program session;Compliance with respiratory medication    Comments  Bug has been taking his inhalers at home. He has been taking spiriva and albuterol. He has no questions on his medications. His oxygen has been within normal limits on 4 liters while exercising. He wears 2 or 3 liters at homw when he is resting. Patient has no other questions about his oxygen.     Goals/Expected Outcomes  Short: increase loads while maintaining oxygen levels. Long: maintain oxygen saturation above 88 percent until the end of the program.       Initial Exercise Prescription: Initial Exercise Prescription - 02/13/18 1100      Date of Initial Exercise RX and Referring Provider   Date  02/13/18    Referring Provider  McQuaid      Oxygen   Oxygen  Continuous    Liters  2      Treadmill   MPH  1    Grade  0    Minutes  15    METs  1.77      NuStep   Level  2    SPM  80    Minutes  15    METs  1.5      Arm Ergometer   Level  1    Minutes  15    METs  1.5      Track  Minutes  15      Prescription Details   Frequency (times per week)  3    Duration  Progress to 45 minutes of aerobic exercise without signs/symptoms of physical distress      Intensity   THRR 40-80% of Max Heartrate  95-113    Ratings of Perceived Exertion  11-13    Perceived Dyspnea  0-4       Resistance Training   Training Prescription  Yes    Weight  2 lb    Reps  10-15       Perform Capillary Blood Glucose checks as needed.  Exercise Prescription Changes:  Exercise Prescription Changes    Row Name 03/01/18 0800 03/02/18 1100 03/15/18 1000 03/28/18 1200 04/11/18 1400     Response to Exercise   Blood Pressure (Admit)  132/72  -  126/64  104/54  104/68   Blood Pressure (Exit)  124/62  -  126/64  118/56  126/64   Heart Rate (Admit)  84 bpm  -  54 bpm  80 bpm  97 bpm   Heart Rate (Exercise)  109 bpm  -  113 bpm  109 bpm  113 bpm   Heart Rate (Exit)  97 bpm  -  93 bpm  97 bpm  91 bpm   Oxygen Saturation (Admit)  92 %  -  94 %  93 %  90 %   Oxygen Saturation (Exercise)  87 %  -  87 %  87 %  87 %   Oxygen Saturation (Exit)  93 %  -  95 %  87 %  91 %   Rating of Perceived Exertion (Exercise)  14  -  '13  13  15   '$ Perceived Dyspnea (Exercise)  2  -  '2  1  1   '$ Symptoms  none  -  none  none  none   Duration  Continue with 45 min of aerobic exercise without signs/symptoms of physical distress.  -  Continue with 45 min of aerobic exercise without signs/symptoms of physical distress.  Continue with 45 min of aerobic exercise without signs/symptoms of physical distress.  Continue with 45 min of aerobic exercise without signs/symptoms of physical distress.   Intensity  THRR unchanged  -  THRR unchanged  THRR unchanged  THRR unchanged     Progression   Progression  Continue to progress workloads to maintain intensity without signs/symptoms of physical distress.  -  Continue to progress workloads to maintain intensity without signs/symptoms of physical distress.  Continue to progress workloads to maintain intensity without signs/symptoms of physical distress.  Continue to progress workloads to maintain intensity without signs/symptoms of physical distress.   Average METs  2  -  1.9  1.93  2     Resistance Training   Training Prescription  Yes  -  Yes  Yes  Yes   Weight   3 lb  -  3  lb  3 lb  3 lb   Reps  10-15  -  10-15  10-15  10-15     Interval Training   Interval Training  No  -  No  No  No     Oxygen   Oxygen  Continuous  -  -  -  -   Liters  4  -  -  -  -     NuStep   Level  -  -  -  2  -   SPM  -  -  -  80  -   Minutes  -  -  -  15  -   METs  -  -  -  1.6  -     Arm Ergometer   Level  1  -  '1  2  2   '$ Minutes  15  -  '15  15  15   '$ METs  1.2  -  1.7  1.7  1.9     Track   Minutes  15 25 laps  -  15 24 laps  15 33 laps  15 21 laps     Home Exercise Plan   Plans to continue exercise at  -  Home (comment)  Home (comment)  Home (comment)  Home (comment)   Frequency  -  Add 2 additional days to program exercise sessions.  Add 2 additional days to program exercise sessions.  Add 2 additional days to program exercise sessions.  Add 2 additional days to program exercise sessions.   Initial Home Exercises Provided  -  03/02/18  03/02/18  03/02/18  03/02/18   Row Name 04/26/18 0800 05/09/18 1200           Response to Exercise   Blood Pressure (Admit)  124/80  124/70      Blood Pressure (Exit)  128/70  118/58      Heart Rate (Admit)  70 bpm  70 bpm      Heart Rate (Exercise)  91 bpm  78 bpm      Heart Rate (Exit)  72 bpm  70 bpm      Oxygen Saturation (Admit)  98 %  99 %      Oxygen Saturation (Exercise)  86 %  85 %      Oxygen Saturation (Exit)  99 %  99 %      Rating of Perceived Exertion (Exercise)  12  11      Perceived Dyspnea (Exercise)  1  1      Symptoms  none  -      Duration  Continue with 45 min of aerobic exercise without signs/symptoms of physical distress.  Continue with 45 min of aerobic exercise without signs/symptoms of physical distress.      Intensity  THRR unchanged  THRR unchanged        Progression   Progression  Continue to progress workloads to maintain intensity without signs/symptoms of physical distress.  Continue to progress workloads to maintain intensity without signs/symptoms of physical distress.      Average METs  2 for  walking  2        Resistance Training   Training Prescription  Yes  Yes      Weight  3 lb  3 lb      Reps  10-15  10-15        Interval Training   Interval Training  No  No        Arm Ergometer   Level  2  -      Minutes  15  -      METs  1.2  -        Track   Minutes  15 23 laps  15        Home Exercise Plan   Plans to continue exercise at  Home (comment)  Home (comment)      Frequency  Add 2 additional days to program exercise sessions.  Add 2 additional days to program exercise sessions.  Initial Home Exercises Provided  03/02/18  03/02/18         Exercise Comments:  Exercise Comments    Row Name 02/16/18 1018 03/02/18 1117 05/16/18 1031       Exercise Comments  First full day of exercise!  Patient was oriented to gym and equipment including functions, settings, policies, and procedures.  Patient's individual exercise prescription and treatment plan were reviewed.  All starting workloads were established based on the results of the 6 minute walk test done at initial orientation visit.  The plan for exercise progression was also introduced and progression will be customized based on patient's performance and goals.  Reviewed home exercise with pt today.  Pt plans to walk and consider [place] for exercise.  Reviewed THR, pulse, RPE, sign and symptoms, NTG use, and when to call 911 or MD.  Also discussed weather considerations and indoor options.  Pt voiced understanding.  Michail graduated today from  rehab with 36 sessions completed.  Details of the patient's exercise prescription and what He needs to do in order to continue the prescription and progress were discussed with patient.  Patient was given a copy of prescription and goals.  Patient verbalized understanding.  [place] plans to continue to exercise by joining [employer].        Exercise Goals and Review:  Exercise Goals    Row Name 02/13/18 1139             Exercise Goals   Increase Physical  Activity  Yes       Intervention  Provide advice, education, support and counseling about physical activity/exercise needs.;Develop an individualized exercise prescription for aerobic and resistive training based on initial evaluation findings, risk stratification, comorbidities and participant's personal goals.       Expected Outcomes  Short Term: Attend rehab on a regular basis to increase amount of physical activity.;Long Term: Add in home exercise to make exercise part of routine and to increase amount of physical activity.;Long Term: Exercising regularly at least 3-5 days a week.       Increase Strength and Stamina  Yes       Intervention  Provide advice, education, support and counseling about physical activity/exercise needs.;Develop an individualized exercise prescription for aerobic and resistive training based on initial evaluation findings, risk stratification, comorbidities and participant's personal goals.       Expected Outcomes  Short Term: Increase workloads from initial exercise prescription for resistance, speed, and METs.;Short Term: Perform resistance training exercises routinely during rehab and add in resistance training at home;Long Term: Improve cardiorespiratory fitness, muscular endurance and strength as measured by increased METs and functional capacity (6MWT)       Able to understand and use rate of perceived exertion (RPE) scale  Yes       Intervention  Provide education and explanation on how to use RPE scale       Expected Outcomes  Short Term: Able to use RPE daily in rehab to express subjective intensity level;Long Term:  Able to use RPE to guide intensity level when exercising independently       Able to understand and use Dyspnea scale  Yes       Intervention  Provide education and explanation on how to use Dyspnea scale       Expected Outcomes  Short Term: Able to use Dyspnea scale daily in rehab to express subjective sense of shortness of breath during exertion;Long  Term: Able to use Dyspnea scale to guide  intensity level when exercising independently       Knowledge and understanding of Target Heart Rate Range (THRR)  Yes       Intervention  Provide education and explanation of THRR including how the numbers were predicted and where they are located for reference       Expected Outcomes  Short Term: Able to state/look up THRR;Short Term: Able to use daily as guideline for intensity in rehab;Long Term: Able to use THRR to govern intensity when exercising independently       Able to check pulse independently  Yes       Intervention  Provide education and demonstration on how to check pulse in carotid and radial arteries.;Review the importance of being able to check your own pulse for safety during independent exercise       Expected Outcomes  Short Term: Able to explain why pulse checking is important during independent exercise;Long Term: Able to check pulse independently and accurately       Understanding of Exercise Prescription  Yes       Intervention  Provide education, explanation, and written materials on patient's individual exercise prescription       Expected Outcomes  Short Term: Able to explain program exercise prescription;Long Term: Able to explain home exercise prescription to exercise independently          Exercise Goals Re-Evaluation : Exercise Goals Re-Evaluation    Row Name 02/16/18 1018 03/01/18 0844 03/02/18 1117 03/15/18 1030 03/28/18 1225     Exercise Goal Re-Evaluation   Exercise Goals Review  Understanding of Exercise Prescription;Able to understand and use rate of perceived exertion (RPE) scale;Able to understand and use Dyspnea scale;Knowledge and understanding of Target Heart Rate Range (THRR)  Increase Physical Activity;Increase Strength and Stamina;Able to understand and use rate of perceived exertion (RPE) scale;Able to understand and use Dyspnea scale  Increase Physical Activity;Able to understand and use rate of perceived  exertion (RPE) scale;Knowledge and understanding of Target Heart Rate Range (THRR);Understanding of Exercise Prescription;Increase Strength and Stamina;Able to understand and use Dyspnea scale  Increase Physical Activity;Increase Strength and Stamina;Able to understand and use rate of perceived exertion (RPE) scale;Able to understand and use Dyspnea scale;Knowledge and understanding of Target Heart Rate Range (THRR)  Increase Physical Activity;Increase Strength and Stamina;Able to understand and use rate of perceived exertion (RPE) scale;Able to understand and use Dyspnea scale   Comments  Reviewed RPE scale, THR and program prescription with pt today.  Pt voiced understanding and was given a copy of goals to take home.   Bug is tolerating exercise very well.  He did 25 laps walking and has increased strength training to 3 lb.  Reviewed home exercise with pt today.  Pt plans to walk and consider [place] for exercise.  Reviewed THR, pulse, RPE, sign and symptoms, NTG use, and when to call 911 or MD.  Also discussed weather considerations and indoor options.  Pt voiced understanding.  Bug tolerates exercise well.  He walks 24-29 laps with his 02 tank most days.  He has some days he is fatigued from other activities.    Bug is progressing well with exercise.  He is practicing PLB and not open mouth breathing to help maintain 02 sats above 88.     Expected Outcomes  Short: Use RPE daily to regulate intensity. Long: Follow program prescription in THR.  Short - attend class consistently Long - increase MET level  Short - walk on days not at [place] -  join Financial controller to maintain exercise  Short - continue to attend consistently Long - increase overall MET level  Short - continue to attend consistently Long - increase MET level   Row Name 04/11/18 1448 04/26/18 0842 05/09/18 1304         Exercise Goal Re-Evaluation   Exercise Goals Review  Increase Physical Activity;Increase Strength and Stamina;Able to  understand and use rate of perceived exertion (RPE) scale;Able to understand and use Dyspnea scale  Increase Physical Activity;Increase Strength and Stamina;Able to understand and use rate of perceived exertion (RPE) scale;Able to understand and use Dyspnea scale;Knowledge and understanding of Target Heart Rate Range (THRR)  Increase Physical Activity;Increase Strength and Stamina;Able to understand and use rate of perceived exertion (RPE) scale;Able to understand and use Dyspnea scale;Knowledge and understanding of Target Heart Rate Range (THRR);Understanding of Exercise Prescription     Comments  Bug tolerates exercise very well.  He walks the track with 02 and does arm crank and NuStep with no complaints.  Bug attends consistently.  His METs have been lower on NS and AC, possibly due to slowing when staff asks RPE.  He does very well walking except a few occasions where his 02 dropped.  Staff will monitor progress.    Bug has increased his stamina and completes all strength work much better than at the beginning.  He plans to continue in [employer].     Expected Outcomes  Short - continue to attend consistently Long - Maintain exercise on his own  Short - attend consistently Long - maintain exercise on his own  Short - complete LW program Long - maintain exercise in FF        Discharge Exercise Prescription (Final Exercise Prescription Changes): Exercise Prescription Changes - 05/09/18 1200      Response to Exercise   Blood Pressure (Admit)  124/70    Blood Pressure (Exit)  118/58    Heart Rate (Admit)  70 bpm    Heart Rate (Exercise)  78 bpm    Heart Rate (Exit)  70 bpm    Oxygen Saturation (Admit)  99 %    Oxygen Saturation (Exercise)  85 %    Oxygen Saturation (Exit)  99 %    Rating of Perceived Exertion (Exercise)  11    Perceived Dyspnea (Exercise)  1    Duration  Continue with 45 min of aerobic exercise without signs/symptoms of physical distress.    Intensity  THRR unchanged       Progression   Progression  Continue to progress workloads to maintain intensity without signs/symptoms of physical distress.    Average METs  2      Resistance Training   Training Prescription  Yes    Weight  3 lb    Reps  10-15      Interval Training   Interval Training  No      Track   Minutes  15      Home Exercise Plan   Plans to continue exercise at  Home (comment)    Frequency  Add 2 additional days to program exercise sessions.    Initial Home Exercises Provided  03/02/18       Nutrition:  Target Goals: Understanding of nutrition guidelines, daily intake of sodium '1500mg'$ , cholesterol '200mg'$ , calories 30% from fat and 7% or less from saturated fats, daily to have 5 or more servings of fruits and vegetables.  Biometrics: Pre Biometrics - 02/13/18 1138      Pre  Biometrics   Height  6' (1.829 m)    Weight  189 lb 1.6 oz (85.8 kg)    Waist Circumference  37.75 inches    Hip Circumference  42 inches    Waist to Hip Ratio  0.9 %    BMI (Calculated)  25.64      Post Biometrics - 05/07/18 1140       Post  Biometrics   Height  6' (1.829 m)    Weight  192 lb (87.1 kg)    Waist Circumference  39 inches    Hip Circumference  42 inches    Waist to Hip Ratio  0.93 %    BMI (Calculated)  26.03       Nutrition Therapy Plan and Nutrition Goals: Nutrition Therapy & Goals - 02/21/18 1044      Nutrition Therapy   Diet  TLC    Protein (specify units)  8oz    Fiber  30 grams    Whole Grain Foods  3 servings   does not always choose whole grains   Saturated Fats  14 max. grams    Fruits and Vegetables  6 servings/day   8 ideal; eats a variety of fruits and vegetables but does not have a large appetite   Sodium  2000 grams      Personal Nutrition Goals   Nutrition Goal  Continue to drink three 8oz Ensure nutritional drinks daily between meals. However, if you find that you are too full to eat at meal times, consider drinking one less per day to prioritize whole food  nutrition    Personal Goal #2  Maintain current diet variety and prioritize fluids daily, ideally drinking more water than sweet tea    Comments  He eats 3 meals per day and chooses fruit and occasionally ice cream as snacks. He usually has cereal and OJ for breakfast, a sandwich or salad for lunch, and his dinners are provided by his daughter. Reports appetite to have decreased over the past year. Ensure supplements are provided by the [facility], educate and counsel regarding individualized specific dietary modifications aiming towards targeted core components such as weight, hypertension, lipid management, diabetes, heart failure and other comorbidities.    Expected Outcomes  Short Term Goal: A plan has been developed with personal nutrition goals set during dietitian appointment.;Long Term Goal: Adherence to prescribed nutrition plan.;Short Term Goal: Understand basic principles of dietary content, such as calories, fat, sodium, cholesterol and nutrients.       Nutrition Assessments:   Nutrition Goals Re-Evaluation: Nutrition Goals Re-Evaluation    [place] Name 02/21/18 1053 03/19/18 1033 04/11/18 1045         Goals   Nutrition Goal  Continue to drink three 8oz Ensure nutritional drinks daily between meals. However, if you find that you are too full to eat at meal times, consider drinking one less per day to prioritize whole food nutrition  Continue to drink 3 Ensure nutritional supplements daily d/t fair appetite and maintain current diet variety. Keep on a regular meal schedule (breakfast, lunch and dinner) as much as possible  Continue to drink 3 Ensure nutritional supplement drinks daily between meals in addition to regular meals; maintain current diet variety and prioritize low/zero sugar fluids     Comment  He drinks 3 Ensure supplements per day as provided by the [place]. C/o decreased appetite but is able to eat 3 meals + supplements  currently.  His  daughter brings him lunch and suppers for the week, and he typically makes his own breakfast. He is drinking 3 Ensure drinks daily consistently  His daughter continues to supply most lunch and dinner meals and he makes his breakfasts at home. He reports no significant changes in appetite or diet.      Expected Outcome  He will prioritize nutrition from whole foods over supplement drinks, and continue to eat on a regular schedule  Continue to drink Ensure drinks 1-3x/day in addition to regular meals. Prioritize whole food nutrition  Maintain CBW +/- 5# and overall diet variety. Eat on a regular schedule and use Ensure nutritional drinks as needed to help maintain CBW       Personal Goal #2 Re-Evaluation   Personal Goal #2  Maintain current diet variety and prioritize fluids daily, ideally drinking more water than sweet tea  -  -        Nutrition Goals Discharge (Final Nutrition Goals Re-Evaluation): Nutrition Goals Re-Evaluation - 04/11/18 1045      Goals   Nutrition Goal  Continue to drink 3 Ensure nutritional supplement drinks daily between meals in addition to regular meals; maintain current diet variety and prioritize low/zero sugar fluids    Comment  His daughter continues to supply most lunch and dinner meals and he makes his breakfasts at home. He reports no significant changes in appetite or diet.     Expected Outcome  Maintain CBW +/- 5# and overall diet variety. Eat on a regular schedule and use Ensure nutritional drinks as needed to help maintain CBW       Psychosocial: Target Goals: Acknowledge presence or absence of significant depression and/or stress, maximize coping skills, provide positive support system. Participant is able to verbalize types and ability to use techniques and skills needed for reducing stress and depression.   Initial Review & Psychosocial Screening: Initial Psych Review & Screening - 02/13/18 1259      Initial Review   Current issues with  None Identified    Was doing yard work up to last event in July. Does miss getting out to do his own [place]?  Yes   Daughter     Barriers   Psychosocial barriers to participate in program  There are no identifiable barriers or psychosocial needs.;The patient should benefit from training in stress management and relaxation.      Screening Interventions   Interventions  Encouraged to exercise    Expected Outcomes  Short Term goal: Utilizing psychosocial counselor, staff and physician to assist with identification of specific Stressors or current issues interfering with healing process. Setting desired goal for each stressor or current issue identified.;Long Term Goal: Stressors or current issues are controlled or eliminated.;Short Term goal: Identification and review with participant of any Quality of Life or Depression concerns found by scoring the questionnaire.;Long Term goal: The participant improves quality of Life and PHQ9 Scores as seen by post scores and/or verbalization of changes       Quality of Life Scores:  Scores of 19 and below usually indicate a poorer quality of life in these areas.  A difference of  2-3 points is a clinically meaningful difference.  A difference of 2-3 points in the total score of the Quality of Life Index has been associated with significant improvement in overall quality of life, self-image, physical symptoms, and general health in studies assessing change in quality of  life.  PHQ-9: Recent Review Flowsheet Data    Depression screen [facility] 2/9 05/02/2018 04/20/2018 02/21/2018 02/08/2018   Decreased Interest '1 1 2 '$ 0   Down, Depressed, Hopeless 1 0 1 1   PHQ - 2 Score '2 1 3 1   '$ Altered sleeping 2 0 1 -   Tired, decreased energy '1 1 1 '$ -   Change in appetite 2 0 2 -   Feeling bad or failure about yourself  1 0 0 -   Trouble concentrating 0 0 0 -   Moving slowly or fidgety/restless 0 0 2 -   Suicidal thoughts 0 0 0 -   PHQ-9 Score '8 2  9 '$ -   Difficult doing work/chores Somewhat difficult Somewhat difficult Somewhat difficult -     Interpretation of Total Score  Total Score Depression Severity:  1-4 = Minimal depression, 5-9 = Mild depression, 10-14 = Moderate depression, 15-19 = Moderately severe depression, 20-27 = Severe depression   Psychosocial Evaluation and Intervention:   Psychosocial Re-Evaluation: Psychosocial Re-Evaluation    [place] Name 02/21/18 1100 04/02/18 1048 04/20/18 1406 05/02/18 1057       Psychosocial Re-Evaluation   Current issues with  Current Stress Concerns  None Identified  Current Stress Concerns  None Identified    Comments  Counselor met with Mr. Hannan "Bug" today for initial psychosocial evaluation.  He is an amazing 84 year old who has been in fairly good health most of his life.  Bug has a strong support system with a daughter who lives close by and does most of his driving for him currently; a strong faith community; and the [place] has nurses visiting and checking in on him regularly.  He reports sleeping well (approximately 8 hours) and has a "fair" appetite.  Bug denies a history of depression or anxiety or any current symptoms and he is typically in a good mood.  Counselor reviewed the PHQ-9 initial score of "13" indicating moderate symptoms of depression - but Bug changed a few of the answers about sleep and energy which decreased his score to a "9" indicating mild symptoms present and mostly related to his physical health.  But states the stress he is currently experiencing relates to being on oxygen full time and how his health and the O2 tank limit him from driving and going places he wants to whenever he wants.  He has goals for this program to get off the full time oxygen and be able to drive himself at least short distances again.  Staff will follow with Bug.  Bug states that everyting is going well and is enjoying [facility].  Reviewed patient health questionnaire (PHQ-9) with patient for  follow up. Previously, patients score indicated signs/symptoms of depression.  Reviewed to see if patient is improving symptom wise while in program.  Score improved because he has been getting better sleep, eating better and feeling less depressed.  Counselor follow up with "Bug" today reporting he hasn't seen much benefit to this program, but knows exercise is a positive thing for his health.  His daughter brings him but Bug reports he drives himself short distances on occasion and feels more independence in that.  Bug comes from a large family of 12 siblings with (39) siblings younger than him.  His brother is in poor health and is 20 years old (2 years younger than Bug) and he visited with him earlier this week.  Bug at 89 is functioning well with sleeping well and minimal stress.  He maintains a positive attitude and plans to continue exercising consistently upon completion of this program.  Staff will continue to follow with him.     Expected Outcomes  Short:  Bug will exercise consistently to improve his health and manage his stress better.   Long:  Bug will hopefully be able to decrease his oxygen tank use and be able to drive short distances again.    Short: attend [facility] to decrease any stress. Long: Maintain exercise to keep stress levels at a minimum.  Short: Continue to attend [facility] regularly for regular exercise and social engagement. Long: Continue to improve symptoms and manage a positive mental state  Short:  Bug will continue to exercise regularly because he knows it "is good for him."   Long:  Bug is 84 years old - he plans to continue to exercise consistently - which is remarkable!     Interventions  -  Encouraged to attend [facility] Rehabilitation for the exercise  Encouraged to attend Pulmonary Rehabilitation for the exercise  -    Continue Psychosocial Services   Follow up required by staff  Follow up required by staff  Follow up required by staff  -       Psychosocial Discharge  (Final Psychosocial Re-Evaluation): Psychosocial Re-Evaluation - 05/02/18 1057      Psychosocial Re-Evaluation   Current issues with  None Identified    Comments  Counselor follow up with "Bug" today reporting he hasn't seen much benefit to this program, but knows exercise is a positive thing for his health.  His daughter brings him but Bug reports he drives himself short distances on occasion and feels more independence in that.  Bug comes from a large family of 12 siblings with (74) siblings younger than him.  His brother is in poor health and is 63 years old (2 years younger than Bug) and he visited with him earlier this week.  Bug at 89 is functioning well with sleeping well and minimal stress.  He maintains a positive attitude and plans to continue exercising consistently upon completion of this program.  Staff will continue to follow with him.     Expected Outcomes  Short:  Bug will continue to exercise regularly because he knows it "is good for him."   Long:  Bug is 84 years old - he plans to continue to exercise consistently - which is remarkable!        Education: Education Goals: Education classes will be provided on a weekly basis, covering required topics. Participant will state understanding/return demonstration of topics presented.  Learning Barriers/Preferences: Learning Barriers/Preferences - 02/13/18 1300      Learning Barriers/Preferences   Learning Barriers  Hearing   Wears hearing aids.  does not always were them   Learning Preferences  None       Education Topics:  Initial Evaluation Education: - Verbal, written and demonstration of respiratory meds, oximetry and breathing techniques. Instruction on use of nebulizers and MDIs and importance of monitoring MDI activations.   Pulmonary Rehab from 05/09/2018 in [facility] Cardiac and Pulmonary Rehab  Date  02/13/18  Educator  SB  Instruction Review Code  1- Verbalizes Understanding      General Nutrition Guidelines/Fats  and Fiber: -Group instruction provided by verbal, written material, models and posters to present the general guidelines for heart healthy nutrition. Gives an explanation and review of dietary fats and fiber.   Pulmonary Rehab from 05/09/2018 in [facility] Cardiac and Pulmonary Rehab  Date  04/25/18  Educator  LB  Instruction Review Code  1- Verbalizes Understanding      Controlling Sodium/Reading Food Labels: -Group verbal and written material supporting the discussion of sodium use in heart healthy nutrition. Review and explanation with models, verbal and written materials for utilization of the food label.   Pulmonary Rehab from 05/09/2018 in [facility] Cardiac and Pulmonary Rehab  Date  05/09/18  Educator  LB  Instruction Review Code  1- Verbalizes Understanding      Exercise Physiology & General Exercise Guidelines: - Group verbal and written instruction with models to review the exercise physiology of the cardiovascular system and associated critical values. Provides general exercise guidelines with specific guidelines to those with heart or lung disease.    Pulmonary Rehab from 05/09/2018 in [facility] Cardiac and Pulmonary Rehab  Date  03/14/18  Educator  [facility]  Instruction Review Code  1- Verbalizes Understanding      Aerobic Exercise & Resistance Training: - Gives group verbal and written instruction on the various components of exercise. Focuses on aerobic and resistive training programs and the benefits of this training and how to safely progress through these programs.   Flexibility, Balance, Mind/Body Relaxation: Provides group verbal/written instruction on the benefits of flexibility and balance training, including mind/body exercise modes such as yoga, pilates and tai chi.  Demonstration and skill practice provided.   Pulmonary Rehab from 05/09/2018 in [facility] Cardiac and Pulmonary Rehab  Date  04/11/18  Educator  AS  Instruction Review Code  1- Verbalizes Understanding      Stress  and Anxiety: - Provides group verbal and written instruction about the health risks of elevated stress and causes of high stress.  Discuss the correlation between heart/lung disease and anxiety and treatment options. Review healthy ways to manage with stress and anxiety.   Pulmonary Rehab from 05/09/2018 in [facility] Cardiac and Pulmonary Rehab  Date  05/02/18  Educator  [facility]  Instruction Review Code  1- Verbalizes Understanding      Depression: - Provides group verbal and written instruction on the correlation between heart/lung disease and depressed mood, treatment options, and the stigmas associated with seeking treatment.   Pulmonary Rehab from 05/09/2018 in [facility] Cardiac and Pulmonary Rehab  Date  04/04/18  Educator  [facility]  Instruction Review Code  1- Verbalizes Understanding      Exercise & Equipment Safety: - Individual verbal instruction and demonstration of equipment use and safety with use of the equipment.   Pulmonary Rehab from 05/09/2018 in [facility] Cardiac and Pulmonary Rehab  Date  02/13/18  Educator  sB  Instruction Review Code  1- Verbalizes Understanding      Infection Prevention: - Provides verbal and written material to individual with discussion of infection control including proper hand washing and proper equipment cleaning during exercise session.   Pulmonary Rehab from 05/09/2018 in [facility] Cardiac and Pulmonary Rehab  Date  02/13/18  Educator  SB1  Instruction Review Code  1- Verbalizes Understanding      Falls Prevention: - Provides verbal and written material to individual with discussion of falls prevention and safety.   Pulmonary Rehab from 05/09/2018 in [facility] Cardiac and Pulmonary Rehab  Date  02/13/18  Educator  SB  Instruction Review Code  1- Verbalizes Understanding      Diabetes: - Individual verbal and written instruction to review signs/symptoms of diabetes, desired ranges of glucose level fasting, after meals and with exercise. Advice that pre and post  exercise glucose checks will be done for 3  sessions at entry of program.   Chronic Lung Diseases: - Group verbal and written instruction to review updates, respiratory medications, advancements in procedures and treatments. Discuss use of supplemental oxygen including available portable oxygen systems, continuous and intermittent flow rates, concentrators, personal use and safety guidelines. Review proper use of inhaler and spacers. Provide informative websites for self-education.    Pulmonary Rehab from 05/09/2018 in [facility] Cardiac and Pulmonary Rehab  Date  03/02/18  Educator  [facility]  Instruction Review Code  1- Verbalizes Understanding      Energy Conservation: - Provide group verbal and written instruction for methods to conserve energy, plan and organize activities. Instruct on pacing techniques, use of adaptive equipment and posture/positioning to relieve shortness of breath.   Pulmonary Rehab from 05/09/2018 in [facility] Cardiac and Pulmonary Rehab  Date  04/18/18  Educator  [facility]  Instruction Review Code  1- Verbalizes Understanding      Triggers and Exacerbations: - Group verbal and written instruction to review types of environmental triggers and ways to prevent exacerbations. Discuss weather changes, air quality and the benefits of nasal washing. Review warning signs and symptoms to help prevent infections. Discuss techniques for effective airway clearance, coughing, and vibrations.   Pulmonary Rehab from 05/09/2018 in [facility] Cardiac and Pulmonary Rehab  Date  03/21/18  Educator  [facility]  Instruction Review Code  1- Verbalizes Understanding      AED/CPR: - Group verbal and written instruction with the use of models to demonstrate the basic use of the AED with the basic ABC's of resuscitation.   Pulmonary Rehab from 05/09/2018 in [facility] Cardiac and Pulmonary Rehab  Date  04/27/18  Educator  [facility]  Instruction Review Code  1- Actuary and Physiology of the Lungs: -  Group verbal and written instruction with the use of models to provide basic lung anatomy and physiology related to function, structure and complications of lung disease.   Pulmonary Rehab from 05/09/2018 in [facility] Cardiac and Pulmonary Rehab  Date  05/04/18  Educator  [facility]  Instruction Review Code  1- Verbalizes Understanding      Anatomy & Physiology of the Heart: - Group verbal and written instruction and models provide basic cardiac anatomy and physiology, with the coronary electrical and arterial systems. Review of Valvular disease and Heart Failure   Pulmonary Rehab from 05/09/2018 in [facility] Cardiac and Pulmonary Rehab  Date  03/30/18  Educator  [facility]  Instruction Review Code  1- Verbalizes Understanding      Cardiac Medications: - Group verbal and written instruction to review commonly prescribed medications for heart disease. Reviews the medication, class of the drug, and side effects.   Know Your Numbers and Risk Factors: -Group verbal and written instruction about important numbers in your health.  Discussion of what are risk factors and how they play a role in the disease process.  Review of Cholesterol, Blood Pressure, Diabetes, and BMI and the role they play in your overall health.   Pulmonary Rehab from 05/09/2018 in [facility] Cardiac and Pulmonary Rehab  Date  03/16/18  Educator  [facility]  Instruction Review Code  1- Verbalizes Understanding      Sleep Hygiene: -Provides group verbal and written instruction about how sleep can affect your health.  Define sleep hygiene, discuss sleep cycles and impact of sleep habits. Review good sleep hygiene tips.    Pulmonary Rehab from 05/09/2018 in [facility] Cardiac and Pulmonary Rehab  Date  02/21/18  Educator  Lupita Leash  Instruction Review Code  1- Verbalizes Understanding      Other: -Provides group and verbal instruction on various topics (see comments)    Knowledge Questionnaire Score: Knowledge Questionnaire Score - 05/02/18 1157       Knowledge Questionnaire Score   Pre Score  13/18    Post Score  12/18   reviewed with patient       Core Components/Risk Factors/Patient Goals at Admission: Personal Goals and Risk Factors at Admission - 02/13/18 1301      Core Components/Risk Factors/Patient Goals on Admission   Improve shortness of breath with ADL's  Yes    Intervention  Provide education, individualized exercise plan and daily activity instruction to help decrease symptoms of SOB with activities of daily living.    Expected Outcomes  Short Term: Improve cardiorespiratory fitness to achieve a reduction of symptoms when performing ADLs;Long Term: Be able to perform more ADLs without symptoms or delay the onset of symptoms    Hypertension  Yes    Intervention  Provide education on lifestyle modifcations including regular physical activity/exercise, weight management, moderate sodium restriction and increased consumption of fresh fruit, vegetables, and low fat dairy, alcohol moderation, and smoking cessation.;Monitor prescription use compliance.    Expected Outcomes  Short Term: Continued assessment and intervention until BP is < 140/58m HG in hypertensive participants. < 130/8108mHG in hypertensive participants with diabetes, heart failure or chronic kidney disease.;Long Term: Maintenance of blood pressure at goal levels.    Lipids  Yes    Intervention  Provide education and support for participant on nutrition & aerobic/resistive exercise along with prescribed medications to achieve LDL '70mg'$ , HDL >'40mg'$ .    Expected Outcomes  Short Term: Participant states understanding of desired cholesterol values and is compliant with medications prescribed. Participant is following exercise prescription and nutrition guidelines.;Long Term: Cholesterol controlled with medications as prescribed, with individualized exercise RX and with personalized nutrition plan. Value goals: LDL < '70mg'$ , HDL > 40 mg.       Core Components/Risk  Factors/Patient Goals Review:  Goals and Risk Factor Review    Row Name 02/28/18 1058 04/02/18 1050 04/30/18 1103         Core Components/Risk Factors/Patient Goals Review   Personal Goals Review  Improve shortness of breath with ADL's;Lipids;Hypertension  Improve shortness of breath with ADL's  Improve shortness of breath with ADL's     Review  Talked with Bug today about his goals. He does not have SOB except with extended walks.  He does well walking down his driveway and street short distance. He is concerned that he is not walking more laps each day"my legs get tired"". Reviewed that he will be here 12 weeks nad has time to strengthen his muscles and improve his walking distance. He verbalized understanding. No concerns with HTN and Cholesterol today.   Patient has improved with his shortness of breath. [place] further than before.  He is able to get around his house a little better without getting too short of breath.  Bug states he does not do that much at home and his shortness of breath varries. His daughter does things for him and he has a person that comes in and cleans for him. He is trying to stay busy and do things for himself.     Expected Outcomes  STG: Bug continues with exercise prescription. LTG Bug sees improvement in walking distance without increased SOB  Short: attned class  to further improve ADL's. Long: Graduate [facility] and continue to exercise.  Short: attened class to further improve ADL's. Long: Graduate [facility] and join a gym to exercise.        Core Components/Risk Factors/Patient Goals at Discharge (Final Review):  Goals and Risk Factor Review - 04/30/18 1103      Core Components/Risk Factors/Patient Goals Review   Personal Goals Review  Improve shortness of breath with ADL's    Review  Bug states he does not do that much at home and his shortness of breath varries. His daughter does things for him and he has a  person that comes in and cleans for him. He is trying to stay busy and do things for himself.    Expected Outcomes  Short: attened class to further improve ADL's. Long: Graduate [facility] and join a gym to exercise.       ITP Comments: ITP Comments    Row Name 02/13/18 1247 03/12/18 0826 04/09/18 0844 05/07/18 0831 05/16/18 1031   ITP Comments  Bug arrived, completed his 6 min walk and at that point we found out he was still receiving [place]. Could not complete the Medical review.  WIll use paperwork to complete ITP evaluation assessments and send to Dr Sabra Heck for review, changes as needed and signature.   Documentation of diagnosis can be found in [facility] 01/25/2018  30 day review completed. ITP sent to Dr. Emily Filbert Director of [place]. Continue with ITP unless changes are made by physician  30 day review completed. ITP sent to Dr. Emily Filbert Director of [place]. Continue with ITP unless changes are made by physician.  30 day review completed. ITP sent to Dr. Emily Filbert Director of [place]. Continue with ITP unless changes are made by physician.  Discharge ITP sent and signed by Dr. Sabra Heck.  Discharge Summary routed to PCP and pulmonologist.      Comments: Discharge ITP

## 2018-05-16 NOTE — Progress Notes (Signed)
Daily Session Note  Patient Details  Name: Paul Cross MRN: [id number] Date of Birth: 1918/01/28 Referring Provider:     Pulmonary Rehab from 02/13/2018 in [facility] Cardiac and Pulmonary Rehab  Referring Provider  McQuaid      Encounter Date: 05/16/2018  Check In: Session Check In - 05/16/18 1014      Check-In   Supervising physician immediately available to respond to emergencies  [facility] immediately available ER MD    Physician(s)  Dr. Quentin Cornwall and Darl Householder    Location  [facility]-Cardiac & Pulmonary Rehab    Staff Present  Justin Mend RCP,RRT,BSRT;Laureen Owens Shark, BS, RRT, Respiratory Dareen Piano, BA, ACSM CEP, Exercise Physiologist    Medication changes reported      No    Fall or balance concerns reported     No    Warm-up and Cool-down  Performed as group-led instruction    Resistance Training Performed  Yes    VAD Patient?  No    PAD/SET Patient?  No      Pain Assessment   Currently in Pain?  No/denies          Social History   Tobacco Use  Smoking Status Former Smoker  Smokeless Tobacco Former Environmental health practitioner Met:  [place] with exercise equipment Exercise tolerated well No report of cardiac concerns or symptoms Strength training completed today  Goals Unmet:  Not Applicable  Comments:  [name] graduated today from  rehab with 36 sessions completed.  Details of the patient's exercise prescription and what He needs to do in order to continue the prescription and progress were discussed with patient.  Patient was given a copy of prescription and goals.  Patient verbalized understanding.  [name] plans to continue to exercise by joining [employer].  Pt able to follow exercise prescription today without complaint.  Will continue to monitor for progression.    Dr. Emily Filbert is Medical Director for [place] and [facility].

## 2018-06-11 ENCOUNTER — Ambulatory Visit: Payer: Medicare Other | Admitting: Family Medicine

## 2018-06-14 ENCOUNTER — Ambulatory Visit: Payer: Medicare Other | Admitting: Family Medicine

## 2018-07-11 ENCOUNTER — Encounter: Payer: Self-pay | Admitting: Family Medicine

## 2018-07-11 ENCOUNTER — Ambulatory Visit (INDEPENDENT_AMBULATORY_CARE_PROVIDER_SITE_OTHER): Payer: Medicare Other | Admitting: Family Medicine

## 2018-07-11 VITALS — BP 138/80 | HR 67 | Temp 97.4°F | Ht 72.0 in | Wt 193.2 lb

## 2018-07-11 DIAGNOSIS — J8410 Pulmonary fibrosis, unspecified: Secondary | ICD-10-CM

## 2018-07-11 DIAGNOSIS — E039 Hypothyroidism, unspecified: Secondary | ICD-10-CM

## 2018-07-11 DIAGNOSIS — R3911 Hesitancy of micturition: Secondary | ICD-10-CM

## 2018-07-11 DIAGNOSIS — I714 Abdominal aortic aneurysm, without rupture, unspecified: Secondary | ICD-10-CM

## 2018-07-11 DIAGNOSIS — I10 Essential (primary) hypertension: Secondary | ICD-10-CM | POA: Diagnosis not present

## 2018-07-11 DIAGNOSIS — L89329 Pressure ulcer of left buttock, unspecified stage: Secondary | ICD-10-CM | POA: Diagnosis not present

## 2018-07-11 DIAGNOSIS — N401 Enlarged prostate with lower urinary tract symptoms: Secondary | ICD-10-CM

## 2018-07-11 DIAGNOSIS — D3501 Benign neoplasm of right adrenal gland: Secondary | ICD-10-CM

## 2018-07-11 DIAGNOSIS — N400 Enlarged prostate without lower urinary tract symptoms: Secondary | ICD-10-CM | POA: Insufficient documentation

## 2018-07-11 LAB — POCT URINALYSIS DIPSTICK
BILIRUBIN UA: NEGATIVE
GLUCOSE UA: NEGATIVE
KETONES UA: NEGATIVE
Nitrite, UA: NEGATIVE
Protein, UA: POSITIVE — AB
RBC UA: NEGATIVE
SPEC GRAV UA: 1.02 (ref 1.010–1.025)
Urobilinogen, UA: 0.2 E.U./dL
pH, UA: 5.5 (ref 5.0–8.0)

## 2018-07-11 MED ORDER — TAMSULOSIN HCL 0.4 MG PO CAPS: 0.8000 mg | ORAL_CAPSULE | Freq: Every day | ORAL | 2 refills | Status: DC

## 2018-07-11 NOTE — Assessment & Plan Note (Signed)
Symptoms likely related to BPH.  We will check a urinalysis.  We will check renal function.  We will increase Flomax.  I discussed the risk of lightheadedness with this.  If he develops lightheadedness he will contact us.

## 2018-07-11 NOTE — Assessment & Plan Note (Signed)
Patient defers vascular evaluation.  We will plan on obtaining a ultrasound to follow-up on this in 1 year.  Given return precautions.

## 2018-07-11 NOTE — Assessment & Plan Note (Signed)
He will continue to see his pulmonologist.  We will send his pulmonologist to message regarding the patient's oxygen therapy.

## 2018-07-11 NOTE — Assessment & Plan Note (Signed)
Very mild erythema.  No signs of infection.  Will refer to wound care given recurrence.  Given return precautions.

## 2018-07-11 NOTE — Assessment & Plan Note (Signed)
Well controlled. Continue current medication.  

## 2018-07-11 NOTE — Assessment & Plan Note (Signed)
Patient would be hesitant to consider surgery for this.  He has not had excessive blood pressure elevations.  I discussed that it is up to him if he would like to complete the urine testing given that he would be hesitant to have surgery for this though I would recommend it completing the testing.

## 2018-07-11 NOTE — Assessment & Plan Note (Signed)
Continue Synthroid °

## 2018-07-11 NOTE — Patient Instructions (Signed)
Nice to see you. If you would like to see the vascular surgeon or complete the urine studies please let us know. We will increase your Flomax.  If you develop any lightheadedness with this please let us know. We will check urine today. We will get you to see wound care as well.

## 2018-07-11 NOTE — Progress Notes (Signed)
[name], MD Phone: [phone]  Paul Cross is a 85 y.o. male who presents today for follow-up.  CC: Interstitial lung disease, hypertension, hypothyroidism, adrenal adenoma, AAA, BPH, buttocks wound  Interstitial lung disease: Patient follows with Dr. Lake Bells.  The patient is supposed to be wearing oxygen most of the time though he typically only wears it at night.  He wonders if he needs the oxygen.  He has issues with the cord getting tangled around his legs.  He is also on Spiriva, albuterol, and Symbicort.  Has been doing pulmonary rehab with good benefit.  He does note some shortness of breath with exertion.  He is not coughing very much.  He does note some mild increased production about 3 times a day that has been going on for few weeks.  No chest congestion or fevers.  No sinus congestion or postnasal drip.  Hypertension: Taking amlodipine.  No chest pain.  Seldom edema.  No orthopnea or PND.  Hypothyroidism: Taking Synthroid.  AAA/common iliac aneurysm: Noted on prior imaging.  Patient has deferred vascular evaluation.  He likely would not be a good surgical candidate.  Recommendation is for follow-up imaging in 2 years per radiology.  No abdominal discomfort.  Adrenal adenoma: He completed part of the work-up for this.  He did not do the urinary catecholamines and metanephrines.  He would be hesitant to have surgery for this if needed.  BPH: Patient takes Flomax 0.4 mg daily.  He notes his stream is not as strong as it had been over the last 6 to 8 months.  He notes he does not feel completely empty when he urinates at times.  Nocturia 2-3 times nightly.  No straining.  No dysuria.  Buttock wound: Patient notes he has had a wound intermittently on his left buttock near the gluteal cleft for the last year plus.  He has been evaluated by dermatology at the [facility] and advised to use a cream.  He notes it will heal and then come back.  He does a lot of sitting.  He does note  it is painful at times.  Social History   Tobacco Use  Smoking Status Former Smoker  Smokeless Tobacco Former User     ROS see history of present illness  Objective  Physical Exam Vitals:   07/11/18 1516  BP: 138/80  Pulse: 67  Temp: (!) 97.4 F (36.3 C)  SpO2: 94%    BP Readings from Last 3 Encounters:  07/11/18 138/80  03/28/18 126/64  02/08/18 (!) 108/58   Wt Readings from Last 3 Encounters:  07/11/18 193 lb 3.2 oz (87.6 kg)  05/07/18 192 lb (87.1 kg)  03/28/18 196 lb 3.2 oz (89 kg)    Physical Exam Constitutional:      General: He is not in acute distress.    Appearance: He is not diaphoretic.  Cardiovascular:     Rate and Rhythm: Normal rate and regular rhythm.     Heart sounds: Normal heart sounds.  Pulmonary:     Effort: Pulmonary effort is normal.     Breath sounds: Normal breath sounds.  Abdominal:     General: Bowel sounds are normal. There is no distension.     Palpations: Abdomen is soft. There is no mass.     Tenderness: There is no abdominal tenderness. There is no guarding or rebound.  Genitourinary:    Comments: Prostate moderately to significantly enlarged, no tenderness, no nodules Musculoskeletal:     Right lower leg:  No edema.     Left lower leg: No edema.  Skin:    General: Skin is warm and dry.       Neurological:     Mental Status: He is alert.      Assessment/Plan: Please see individual problem list.  Pulmonary fibrosis ([place]) He will continue to see his pulmonologist.  We will send his pulmonologist to message regarding the patient's oxygen therapy.  Hypertension Well-controlled.  Continue current medication.  Abdominal aortic aneurysm (AAA) 3.0 cm to 5.5 cm in diameter in male [facility]) Patient defers vascular evaluation.  We will plan on obtaining a ultrasound to follow-up on this in 1 year.  Given return precautions.  Adrenal adenoma, right Patient would be hesitant to consider surgery for this.  He has not had  excessive blood pressure elevations.  I discussed that it is up to him if he would like to complete the urine testing given that he would be hesitant to have surgery for this though I would recommend it completing the testing.  Hypothyroidism Continue Synthroid.  BPH (benign prostatic hyperplasia) Symptoms likely related to BPH.  We will check a urinalysis.  We will check renal function.  We will increase Flomax.  I discussed the risk of lightheadedness with this.  If he develops lightheadedness he will contact us.  Pressure injury of skin of left buttock Very mild erythema.  No signs of infection.  Will refer to wound care given recurrence.  Given return precautions.  Orders Placed This Encounter  Procedures  . Basic Metabolic Panel (BMET)  . AMB referral to wound care center    Referral Priority:   Routine    Referral Type:   Consultation    Number of Visits Requested:   1  . POCT Urinalysis Dipstick    Meds ordered this encounter  Medications  . DISCONTD: tamsulosin (FLOMAX) 0.4 MG CAPS capsule    Sig: Take 2 capsules (0.8 mg total) by mouth daily.    Dispense:  60 capsule    Refill:  2  . tamsulosin (FLOMAX) 0.4 MG CAPS capsule    Sig: Take 2 capsules (0.8 mg total) by mouth daily.    Dispense:  60 capsule    Refill:  2     [name], MD [place]

## 2018-07-12 ENCOUNTER — Telehealth: Payer: Self-pay | Admitting: Family Medicine

## 2018-07-12 LAB — BASIC METABOLIC PANEL
BUN: 29 mg/dL — ABNORMAL HIGH (ref 6–23)
CO2: 28 meq/L (ref 19–32)
Calcium: 9.9 mg/dL (ref 8.4–10.5)
Chloride: 102 mEq/L (ref 96–112)
Creatinine, Ser: 1.17 mg/dL (ref 0.40–1.50)
GFR: 57.51 mL/min — AB (ref >60.00)
GLUCOSE: 96 mg/dL (ref 70–99)
Potassium: 4.6 mEq/L (ref 3.5–5.1)
SODIUM: 139 meq/L (ref 135–145)

## 2018-07-12 NOTE — Telephone Encounter (Signed)
Pt advised and voiced understanding.   

## 2018-07-12 NOTE — Telephone Encounter (Signed)
-----   Message from Juanito Doom, MD sent at 07/12/2018  8:31 AM EST ----- Paul Cross,  As long as he knows that his risk of pulmonary hypertension and heart failure is high without using O2 then he can live life however he chooses.  I hate making oxygen a barrier to quality of life which it unfortunately often can be.  However I make sure that patient's understand the consequences.  I will not allow any activity that endangers the public however, so he has to use O2 while driving.   Hope this helps, Paul Cross ----- Message ----- From: Paul Haven, MD Sent: 07/11/2018   5:31 PM EST To: Juanito Doom, MD  Hi Dr Lake Bells,   I hope all is well. I saw [name] today for follow-up. He seems to be doing fairly well, though he is questioning whether or not he needs his oxygen.  He notes that he does not feel any different on the oxygen versus off of the oxygen.  He notes he has not been wearing it very frequently recently.  One of his concerns is the length of cord for his nasal cannula and the fact that this gets tangled around his feet at times.  I know given his lung disease that he likely needs the oxygen and will continue to need this though I advised the patient I would check with you regarding whether or not the oxygen therapy was necessary for him.  If he does continue to need the oxygen would there be a way to have his DME company provide him with a easier oxygen delivery device to use?  Thank you for your help.  Randall Hiss

## 2018-07-12 NOTE — Telephone Encounter (Signed)
Please let the patient know that I heard back from his pulmonologist regarding his oxygen therapy.  The pulmonologist noted that there is a high risk of developing pulmonary hypertension, which could make his breathing worse, and of heart failure, which could also make his breathing worse, if he were not to use the oxygen.  These are very good reasons to continue using oxygen though if he understands the risks of developing these issues and he does not want to use the oxygen that is fine.  The pulmonologist did note that the patient would have to use oxygen while driving as that could endanger himself and others if he were not using his oxygen.  Thanks.

## 2018-07-14 ENCOUNTER — Other Ambulatory Visit: Payer: Self-pay | Admitting: Family Medicine

## 2018-07-14 DIAGNOSIS — R809 Proteinuria, unspecified: Secondary | ICD-10-CM

## 2018-07-19 ENCOUNTER — Encounter: Payer: Medicare Other | Attending: Physician Assistant | Admitting: Physician Assistant

## 2018-07-19 DIAGNOSIS — F0390 Unspecified dementia without behavioral disturbance: Secondary | ICD-10-CM | POA: Diagnosis not present

## 2018-07-19 DIAGNOSIS — Z9981 Dependence on supplemental oxygen: Secondary | ICD-10-CM | POA: Insufficient documentation

## 2018-07-19 DIAGNOSIS — I10 Essential (primary) hypertension: Secondary | ICD-10-CM | POA: Insufficient documentation

## 2018-07-19 DIAGNOSIS — L539 Erythematous condition, unspecified: Secondary | ICD-10-CM | POA: Diagnosis present

## 2018-07-19 DIAGNOSIS — B354 Tinea corporis: Secondary | ICD-10-CM | POA: Diagnosis not present

## 2018-07-19 DIAGNOSIS — J449 Chronic obstructive pulmonary disease, unspecified: Secondary | ICD-10-CM | POA: Diagnosis not present

## 2018-07-19 DIAGNOSIS — Z87891 Personal history of nicotine dependence: Secondary | ICD-10-CM | POA: Insufficient documentation

## 2018-07-21 NOTE — Progress Notes (Signed)
MARV, ALFREY ([id number]) Visit Report for 07/19/2018 Abuse/Suicide Risk Screen Details Patient Name: Paul Cross, Paul Cross. Date of Service: 07/19/2018 8:45 AM Medical Record Number: [id number] Patient Account Number: [id number] Date of Birth/Sex: Dec 18, 1917 (85 y.o. M) Treating RN: Cornell Barman Primary Care Naydeline Morace: Caryl Bis, ERIC Other Clinician: Referring Bush Murdoch: Caryl Bis, ERIC Treating Joeanne Robicheaux/Extender: Melburn Hake, HOYT Weeks in Treatment: 0 Abuse/Suicide Risk Screen Items Answer ABUSE/SUICIDE RISK SCREEN: Has anyone close to you tried to hurt or harm you recentlyo No Do you feel uncomfortable with anyone in your familyo No Has anyone forced you do things that you didnot want to doo No Do you have any thoughts of harming yourselfo No Patient displays signs or symptoms of abuse and/or neglect. No Electronic Signature(s) Signed: 07/19/2018 5:40:20 PM By: Gretta Cool, BSN, RN, CWS, Kim RN, BSN Entered By: Gretta Cool, BSN, RN, CWS, Kim on 07/19/2018 09:07:35 Paul Cross ([id number]) -------------------------------------------------------------------------------- Activities of Daily Living Details Patient Name: Paul Cross, Paul Cross. Date of Service: 07/19/2018 8:45 AM Medical Record Number: [id number] Patient Account Number: [id number] Date of Birth/Sex: 03-23-1918 (85 y.o. M) Treating RN: Cornell Barman Primary Care Janan Bogie: Caryl Bis, ERIC Other Clinician: Referring Leanore Biggers: Caryl Bis, ERIC Treating Gerry Heaphy/Extender: Melburn Hake, HOYT Weeks in Treatment: 0 Activities of Daily Living Items Answer Activities of Daily Living (Please select one for each item) Drive Automobile Completely Able Take Medications Completely Able Use Telephone Completely Able Care for Appearance Completely Able Use Toilet Completely Able Bath / Shower Completely Able Dress Self Completely Able Feed Self Completely Able Walk Completely Able Get In / Out Bed Completely Able Housework  Completely Able Prepare Meals Completely [place] for Self Completely Able Electronic Signature(s) Signed: 07/19/2018 5:40:20 PM By: Gretta Cool, BSN, RN, CWS, Kim RN, BSN Entered By: Gretta Cool, BSN, RN, CWS, Kim on 07/19/2018 09:07:52 Paul Cross ([id number]) -------------------------------------------------------------------------------- Education Assessment Details Patient Name: Paul Cross. Date of Service: 07/19/2018 8:45 AM Medical Record Number: [id number] Patient Account Number: [id number] Date of Birth/Sex: 01/05/1918 (85 y.o. M) Treating RN: Cornell Barman Primary Care Allante Whitmire: Caryl Bis, ERIC Other Clinician: Referring Taijah Macrae: Caryl Bis, ERIC Treating Kyrus Hyde/Extender: Sharalyn Ink in Treatment: 0 Primary Learner Assessed: Patient Learning Preferences/Education Level/Primary Language Learning Preference: Explanation, Demonstration Highest Education Level: College or Above Preferred Language: English Cognitive Barrier Assessment/Beliefs Language Barrier: No Translator Needed: No Memory Deficit: No Emotional Barrier: No Cultural/Religious Beliefs Affecting Medical Care: No Physical Barrier Assessment Impaired Vision: Yes Glasses Impaired Hearing: Yes Decreased Hand dexterity: No Knowledge/Comprehension Assessment Knowledge Level: High Comprehension Level: High Ability to understand written High instructions: Ability to understand verbal High instructions: Motivation Assessment Anxiety Level: Calm Cooperation: Cooperative Education Importance: Acknowledges Need Interest in Health Problems: Asks Questions Perception: Coherent Willingness to Engage in Self- High Management Activities: Readiness to Engage in Self- High Management Activities: Electronic Signature(s) Signed: 07/19/2018 5:40:20 PM By: Gretta Cool, BSN, RN, CWS, Kim RN, BSN Entered By: Gretta Cool, BSN, RN, CWS, Kim on 07/19/2018 03:54:65 Paul Cross ([id number]) -------------------------------------------------------------------------------- Fall Risk Assessment Details Patient Name: Paul Cross Date of Service: 07/19/2018 8:45 AM Medical Record Number: [id number] Patient Account Number: [id number] Date of Birth/Sex: [birth year] (85 y.o. M) Treating RN: Cornell Barman Primary Care Zamier Eggebrecht: Caryl Bis, ERIC Other Clinician: Referring Jams Trickett: Caryl Bis, ERIC Treating Ronney Honeywell/Extender: Melburn Hake, HOYT Weeks in Treatment: 0 Fall Risk Assessment Items Have you had 2 or more falls in the last 12 monthso 0 No Have you had any fall that resulted in injury in the last 12 monthso 0 No FALL RISK ASSESSMENT:  History of falling - immediate or within 3 months 0 No Secondary diagnosis 0 No Ambulatory aid None/bed rest/wheelchair/nurse 0 Yes Crutches/cane/walker 0 No Furniture 0 No IV Access/Saline Lock 0 No Gait/Training Normal/bed rest/immobile 0 Yes Weak 0 No Impaired 0 No Mental Status Oriented to own ability 0 Yes Electronic Signature(s) Signed: 07/19/2018 5:40:20 PM By: Gretta Cool, BSN, RN, CWS, Kim RN, BSN Entered By: Gretta Cool, BSN, RN, CWS, Kim on 07/19/2018 09:09:33 Paul Cross ([id number]) -------------------------------------------------------------------------------- Foot Assessment Details Patient Name: Paul Cross. Date of Service: 07/19/2018 8:45 AM Medical Record Number: [id number] Patient Account Number: [id number] Date of Birth/Sex: July 07, 1917 (85 y.o. M) Treating RN: Cornell Barman Primary Care Lakiesha Ralphs: Caryl Bis, ERIC Other Clinician: Referring Ayame Rena: Caryl Bis, ERIC Treating Chucky Homes/Extender: Melburn Hake, HOYT Weeks in Treatment: 0 Foot Assessment Items Site Locations + = Sensation present, - = Sensation absent, C = Callus, U = Ulcer R = Redness, W = Warmth, M = Maceration, PU = Pre-ulcerative lesion F = Fissure, S = Swelling, D = Dryness Assessment Right: Left: Other Deformity: No  No Prior Foot Ulcer: No No Prior Amputation: No No Charcot Joint: No No Ambulatory Status: Ambulatory With Help Assistance Device: Walker GaitEnergy manager) Signed: 07/19/2018 5:40:20 PM By: Gretta Cool, BSN, RN, CWS, Kim RN, BSN Entered By: Gretta Cool, BSN, RN, CWS, Kim on 07/19/2018 09:09:58 Paul Cross ([id number]) -------------------------------------------------------------------------------- Nutrition Risk Assessment Details Patient Name: Paul Cross. Date of Service: 07/19/2018 8:45 AM Medical Record Number: [id number] Patient Account Number: [id number] Date of Birth/Sex: 1918/01/10 (85 y.o. M) Treating RN: Cornell Barman Primary Care Aubria Vanecek: Caryl Bis, ERIC Other Clinician: Referring Mende Biswell: Caryl Bis, ERIC Treating Fallou Hulbert/Extender: Melburn Hake, HOYT Weeks in Treatment: 0 Height (in): 72 Weight (lbs): 194.2 Body Mass Index (BMI): 26.3 Nutrition Risk Assessment Items NUTRITION RISK SCREEN: I have an illness or condition that made me change the kind and/or amount of 0 No food I eat I eat fewer than two meals per day 0 No I eat few fruits and vegetables, or milk products 0 No I have three or more drinks of beer, liquor or wine almost every day 0 No I have tooth or mouth problems that make it hard for me to eat 0 No I don't always have enough money to buy the food I need 0 No I eat alone most of the time 0 No I take three or more different prescribed or over-the-counter drugs a day 1 Yes Without wanting to, I have lost or gained 10 pounds in the last six months 0 No I am not always physically able to shop, cook and/or feed myself 0 No Nutrition Protocols Good Risk Protocol 0 No interventions needed Moderate Risk Protocol Electronic Signature(s) Signed: 07/19/2018 5:40:20 PM By: Gretta Cool, BSN, RN, CWS, Kim RN, BSN Entered By: Gretta Cool, BSN, RN, CWS, Kim on 07/19/2018 09:09:44

## 2018-07-21 NOTE — Progress Notes (Signed)
ADD, DINAPOLI ([id number]) Visit Report for 07/19/2018 Chief Complaint Document Details Patient Name: Paul Cross, Paul Cross. Date of Service: 07/19/2018 8:45 AM Medical Record Number: [id number] Patient Account Number: [id number] Date of Birth/Sex: 06/26/1917 (85 y.o. M) Treating RN: Harold Barban Primary Care Provider: Caryl Bis, ERIC Other Clinician: Referring Provider: Caryl Bis, ERIC Treating Provider/Extender: Melburn Hake, Eliab Closson Weeks in Treatment: 0 Information Obtained from: Patient Chief Complaint Gluteal skin breakdown Electronic Signature(s) Signed: 07/19/2018 9:52:35 PM By: Worthy Keeler PA-C Entered By: Worthy Keeler on 07/19/2018 09:22:04 Paul Cross ([id number]) -------------------------------------------------------------------------------- HPI Details Patient Name: Paul Cross Date of Service: 07/19/2018 8:45 AM Medical Record Number: [id number] Patient Account Number: [id number] Date of Birth/Sex: 1918/01/30 (85 y.o. M) Treating RN: Harold Barban Primary Care Provider: Caryl Bis, ERIC Other Clinician: Referring Provider: Caryl Bis, ERIC Treating Provider/Extender: Melburn Hake, Addysyn Fern Weeks in Treatment: 0 History of Present Illness HPI Description: 07/19/18 on evaluation today patient presents for initial evaluation our clinic concerning an issue that he's been having with his gluteal region currently. He states that this has been present for months. Fortunately there does not appear to be any evidence of infection and in fact he really has no true open wound. With that being said he does have an area that bothers him when he sits on it for too long that he has been using a story cream on and unfortunately this does not seem to be helping to improve the situation much at all. He does have a pillow that he has for his chair and he does have a lift chair as well. He's also currently sleeping in bed at night which is good news. As far as the  area of concern is concerned he really has not had anything draining from it but states that he feels a "scab" at times which will fall off after some point but always seems to recur. Again he tells me roughly this has been present for about a year. He does have a history of COPD as well as dependence on oxygen and hypertension. Due to his respiratory issues he is not able to be as active as he once was he is currently 85 years old. Electronic Signature(s) Signed: 07/19/2018 9:52:35 PM By: Worthy Keeler PA-C Entered By: Worthy Keeler on 07/19/2018 15:36:33 Paul Cross ([id number]) -------------------------------------------------------------------------------- Physical Exam Details Patient Name: Paul Cross Date of Service: 07/19/2018 8:45 AM Medical Record Number: [id number] Patient Account Number: [id number] Date of Birth/Sex: 16-May-1918 (85 y.o. M) Treating RN: Harold Barban Primary Care Provider: Caryl Bis, ERIC Other Clinician: Referring Provider: Caryl Bis, ERIC Treating Provider/Extender: STONE III, Jaycion Treml Weeks in Treatment: 0 Constitutional patient is hypertensive.. pulse regular and within target range for patient.Marland Kitchen respirations regular, non-labored and within target range for patient.Marland Kitchen temperature within target range for patient.. Well-nourished and well-hydrated in no acute distress. Eyes conjunctiva clear no eyelid edema noted. pupils equal round and reactive to light and accommodation. Ears, Nose, Mouth, and Throat no gross abnormality of ear auricles or external auditory canals. normal hearing noted during conversation. mucus membranes moist. Respiratory normal breathing without difficulty. clear to auscultation bilaterally. Cardiovascular regular rate and rhythm with normal S1, S2. no clubbing, cyanosis, significant edema, <3 sec cap refill. Gastrointestinal (GI) soft, non-tender, non-distended, +BS. no ventral hernia  noted. Musculoskeletal normal gait and posture. no significant deformity or arthritic changes, no loss or range of motion, no clubbing. Psychiatric this patient is able to make decisions and demonstrates good insight into disease process. Alert and Oriented x  3. pleasant and cooperative. Notes Upon evaluation today patient did not have any open wound at this time which is excellent news. With that being said he did have a somewhat erythematous rash that did not appear to be just pressure related in the gluteal region. This was actually on the inner aspect of the left and right gluteal region. Subsequently my concern here is actually a fungal skin infection. Electronic Signature(s) Signed: 07/19/2018 9:52:35 PM By: Worthy Keeler PA-C Entered By: Worthy Keeler on 07/19/2018 15:49:37 Paul Cross ([id number]) -------------------------------------------------------------------------------- Physician Orders Details Patient Name: Paul Cross Date of Service: 07/19/2018 8:45 AM Medical Record Number: [id number] Patient Account Number: [id number] Date of Birth/Sex: 1917-11-13 (85 y.o. M) Treating RN: Harold Barban Primary Care Provider: Caryl Bis, ERIC Other Clinician: Referring Provider: Caryl Bis, ERIC Treating Provider/Extender: Melburn Hake, Ambyr Qadri Weeks in Treatment: 0 Verbal / Phone Orders: No Diagnosis Coding ICD-10 Coding Code Description L98.8 Other specified disorders of the skin and subcutaneous tissue J44.9 Chronic obstructive pulmonary disease, unspecified Z99.81 Dependence on supplemental oxygen I10 Essential (primary) hypertension Follow-up Appointments o Other: - May follow-up as needed. Discharge From [facility] Services o Discharge from [place] Patient Medications Allergies: Benedryl Notifications Medication Indication Start End nystatin 07/19/2018 DOSE topical 100,000 unit/gram cream - cream topical applied in a thin film to the  affected region 2 times a day for 2 months Electronic Signature(s) Signed: 07/19/2018 11:09:38 AM By: Worthy Keeler PA-C Entered By: Worthy Keeler on 07/19/2018 11:09:38 Paul Cross ([id number]) -------------------------------------------------------------------------------- Problem List Details Patient Name: Paul Cross. Date of Service: 07/19/2018 8:45 AM Medical Record Number: [id number] Patient Account Number: [id number] Date of Birth/Sex: 21-Oct-1917 (85 y.o. M) Treating RN: Harold Barban Primary Care Provider: Caryl Bis, ERIC Other Clinician: Referring Provider: Caryl Bis, ERIC Treating Provider/Extender: Melburn Hake, Shannen Vernon Weeks in Treatment: 0 Active Problems ICD-10 Evaluated Encounter Code Description Active Date Today Diagnosis L98.8 Other specified disorders of the skin and subcutaneous 07/19/2018 No Yes tissue B35.4 Tinea corporis 07/19/2018 No Yes J44.9 Chronic obstructive pulmonary disease, unspecified 07/19/2018 No Yes Z99.81 Dependence on supplemental oxygen 07/19/2018 No Yes I10 Essential (primary) hypertension 07/19/2018 No Yes Inactive Problems Resolved Problems Electronic Signature(s) Signed: 07/19/2018 9:52:35 PM By: Worthy Keeler PA-C Entered By: Worthy Keeler on 07/19/2018 10:09:03 Paul Cross ([id number]) -------------------------------------------------------------------------------- Progress Note Details Patient Name: Paul Cross. Date of Service: 07/19/2018 8:45 AM Medical Record Number: [id number] Patient Account Number: [id number] Date of Birth/Sex: Oct 09, 1917 (85 y.o. M) Treating RN: Harold Barban Primary Care Provider: Caryl Bis, ERIC Other Clinician: Referring Provider: Caryl Bis, ERIC Treating Provider/Extender: Melburn Hake, Dusan Lipford Weeks in Treatment: 0 Subjective Chief Complaint Information obtained from Patient Gluteal skin breakdown History of Present Illness (HPI) 07/19/18 on evaluation today  patient presents for initial evaluation our clinic concerning an issue that he's been having with his gluteal region currently. He states that this has been present for months. Fortunately there does not appear to be any evidence of infection and in fact he really has no true open wound. With that being said he does have an area that bothers him when he sits on it for too long that he has been using a story cream on and unfortunately this does not seem to be helping to improve the situation much at all. He does have a pillow that he has for his chair and he does have a lift chair as well. He's also currently sleeping in bed at night which is good news. As far as the  area of concern is concerned he really has not had anything draining from it but states that he feels a "scab" at times which will fall off after some point but always seems to recur. Again he tells me roughly this has been present for about a year. He does have a history of COPD as well as dependence on oxygen and hypertension. Due to his respiratory issues he is not able to be as active as he once was he is currently 85 years old. Wound History Patient presents with 1 open wound that has been present for approximately 1 year. Patient has been treating wound in the following manner: Triamcinolome. The wound has been healed in the past but has re-opened. Laboratory tests have not been performed in the last month. Patient reportedly has not tested positive for an antibiotic resistant organism. Patient reportedly has tested positive for osteomyelitis. Patient reportedly has had testing performed to evaluate circulation in the legs. Patient History Information obtained from Patient, Chart. Allergies Benedryl Social History Former smoker, Marital Status - Widowed, Alcohol Use - Never, Drug Use - No History, Caffeine Use - Daily. Medical History Eyes Denies history of Cataracts, Glaucoma, Optic Neuritis Ear/Nose/Mouth/Throat Denies  history of Chronic sinus problems/congestion, Middle ear problems Hematologic/Lymphatic Denies history of Anemia, Hemophilia, Human Immunodeficiency Virus, Lymphedema, Sickle Cell Disease Respiratory Patient has history of Chronic Obstructive Pulmonary Disease (COPD) - O2 Denies history of Aspiration, Asthma, Pneumothorax, Sleep Apnea, Tuberculosis Cardiovascular Patient has history of Hypertension Denies history of Angina, Arrhythmia, Congestive Heart Failure, Coronary Artery Disease, Deep Vein Thrombosis, Mcgough, Maleek L. ([id number]) Hypotension, Myocardial Infarction, Peripheral Arterial Disease, Peripheral Venous Disease, Phlebitis, Vasculitis Gastrointestinal Denies history of Cirrhosis , Colitis, Crohn s, Hepatitis A, Hepatitis B, Hepatitis C Endocrine Denies history of Type I Diabetes, Type II Diabetes Genitourinary Denies history of End Stage Renal Disease Immunological Denies history of Lupus Erythematosus, Raynaud s, Scleroderma Integumentary (Skin) Denies history of History of Burn, History of pressure wounds Musculoskeletal Denies history of Gout, Rheumatoid Arthritis, Osteoarthritis, Osteomyelitis Neurologic Patient has history of Dementia - mild Denies history of Neuropathy, Quadriplegia, Paraplegia Oncologic Denies history of Received Chemotherapy, Received Radiation Psychiatric Denies history of Anorexia/bulimia, Confinement Anxiety Hospitalization/Surgery History - 01/01/2018, [facility], breathing. Review of Systems (ROS) Constitutional Symptoms (General Health) The patient has no complaints or symptoms. Eyes The patient has no complaints or symptoms. Ear/Nose/Mouth/Throat The patient has no complaints or symptoms. Hematologic/Lymphatic The patient has no complaints or symptoms. Respiratory Complains or has symptoms of Shortness of Breath - O2 2L 24-7. Denies complaints or symptoms of Chronic or frequent coughs. Cardiovascular The patient has no complaints  or symptoms. Gastrointestinal The patient has no complaints or symptoms. Endocrine The patient has no complaints or symptoms. Genitourinary The patient has no complaints or symptoms. Immunological The patient has no complaints or symptoms. Integumentary (Skin) Complains or has symptoms of Wounds. Denies complaints or symptoms of Bleeding or bruising tendency, Breakdown, Swelling. Musculoskeletal The patient has no complaints or symptoms. Neurologic The patient has no complaints or symptoms. Oncologic The patient has no complaints or symptoms. Psychiatric The patient has no complaints or symptoms. Paul Cross, Paul Cross ([id number]) Objective Constitutional patient is hypertensive.. pulse regular and within target range for patient.Marland Kitchen respirations regular, non-labored and within target range for patient.Marland Kitchen temperature within target range for patient.. Well-nourished and well-hydrated in no acute distress. Vitals Time Taken: 8:54 AM, Height: 72 in, Source: Stated, Weight: 194.2 lbs, Source: Measured, BMI: 26.3, Temperature: 97.8 F, Pulse: 67 bpm, Respiratory Rate: 16 breaths/min,  Blood Pressure: 145/65 mmHg. Eyes conjunctiva clear no eyelid edema noted. pupils equal round and reactive to light and accommodation. Ears, Nose, Mouth, and Throat no gross abnormality of ear auricles or external auditory canals. normal hearing noted during conversation. mucus membranes moist. Respiratory normal breathing without difficulty. clear to auscultation bilaterally. Cardiovascular regular rate and rhythm with normal S1, S2. no clubbing, cyanosis, significant edema, Gastrointestinal (GI) soft, non-tender, non-distended, +BS. no ventral hernia noted. Musculoskeletal normal gait and posture. no significant deformity or arthritic changes, no loss or range of motion, no clubbing. Psychiatric this patient is able to make decisions and demonstrates good insight into disease process. Alert and  Oriented x 3. pleasant and cooperative. General Notes: Upon evaluation today patient did not have any open wound at this time which is excellent news. With that being said he did have a somewhat erythematous rash that did not appear to be just pressure related in the gluteal region. This was actually on the inner aspect of the left and right gluteal region. Subsequently my concern here is actually a fungal skin infection. Assessment Active Problems ICD-10 Other specified disorders of the skin and subcutaneous tissue Tinea corporis Chronic obstructive pulmonary disease, unspecified Dependence on supplemental oxygen Essential (primary) hypertension Paul Cross, Paul L. ([id number]) Plan Follow-up Appointments: Other: - May follow-up as needed. Discharge From [facility]: Discharge from [place] The following medication(s) was prescribed: nystatin topical 100,000 unit/gram cream cream topical applied in a thin film to the affected region 2 times a day for 2 months starting 07/19/2018 I'm in a recommend at this point that we go ahead and initiate treatment with an antifungal cream, nystatin. The patient is in agreement with that plan. I did give him refills on this as I'd like for him to use it for two months. Hopefully this will allow the area to heal and if indeed it is a fungal infection any steroids given would likely just make this worse anyway. He is in agreement with this plan. I do not think he requires a specific follow-up although we did have an extensive conversation about offloading and ensuring that nothing worsens in that regard. He's in agreement with that plan as well. Subsequently I will see him back as needed if anything changes or worsens. Electronic Signature(s) Signed: 07/19/2018 9:52:35 PM By: Worthy Keeler PA-C Entered By: Worthy Keeler on 07/19/2018 15:50:40 Paul Cross  ([id number]) -------------------------------------------------------------------------------- ROS/PFSH Details Patient Name: Paul Cross Date of Service: 07/19/2018 8:45 AM Medical Record Number: [id number] Patient Account Number: [id number] Date of Birth/Sex: [birth year] (85 y.o. M) Treating RN: Cornell Barman Primary Care Provider: Caryl Bis, ERIC Other Clinician: Referring Provider: Caryl Bis, ERIC Treating Provider/Extender: Melburn Hake, Andry Bogden Weeks in Treatment: 0 Information Obtained From Patient Chart Wound History Do you currently have one or more open woundso Yes How many open wounds do you currently haveo 1 Approximately how long have you had your woundso 1 year How have you been treating your wound(s) until nowo Triamcinolome Has your wound(s) ever healed and then re-openedo Yes Have you had any lab work done in the past montho No Have you tested positive for an antibiotic resistant organism (MRSA, VRE)o No Have you tested positive for osteomyelitis (bone infection)o Yes Have you had any tests for circulation on your legso Yes Hematologic/Lymphatic Complaints and Symptoms: No Complaints or Symptoms Complaints and Symptoms: Negative for: Bleeding / Clotting Disorders; Human Immunodeficiency Virus Medical History: Negative for: Anemia; Hemophilia; Human Immunodeficiency Virus; Lymphedema; Sickle Cell Disease Respiratory  Complaints and Symptoms: Positive for: Shortness of Breath - O2 2L 24-7 Negative for: Chronic or frequent coughs Medical History: Positive for: Chronic Obstructive Pulmonary Disease (COPD) - O2 Negative for: Aspiration; Asthma; Pneumothorax; Sleep Apnea; Tuberculosis Integumentary (Skin) Complaints and Symptoms: Positive for: Wounds Negative for: Bleeding or bruising tendency; Breakdown; Swelling Medical History: Negative for: History of Burn; History of pressure wounds Constitutional Symptoms (General Health) Complaints and Symptoms: No  Complaints or Symptoms Paul Cross, Paul L. ([id number]) Eyes Complaints and Symptoms: No Complaints or Symptoms Medical History: Negative for: Cataracts; Glaucoma; Optic Neuritis Ear/Nose/Mouth/Throat Complaints and Symptoms: No Complaints or Symptoms Medical History: Negative for: Chronic sinus problems/congestion; Middle ear problems Cardiovascular Complaints and Symptoms: No Complaints or Symptoms Medical History: Positive for: Hypertension Negative for: Angina; Arrhythmia; Congestive Heart Failure; Coronary Artery Disease; Deep Vein Thrombosis; Hypotension; Myocardial Infarction; Peripheral Arterial Disease; Peripheral Venous Disease; Phlebitis; Vasculitis Gastrointestinal Complaints and Symptoms: No Complaints or Symptoms Medical History: Negative for: Cirrhosis ; Colitis; Crohnos; Hepatitis A; Hepatitis B; Hepatitis C Endocrine Complaints and Symptoms: No Complaints or Symptoms Medical History: Negative for: Type I Diabetes; Type II Diabetes Genitourinary Complaints and Symptoms: No Complaints or Symptoms Medical History: Negative for: End Stage Renal Disease Immunological Complaints and Symptoms: No Complaints or Symptoms Medical History: Negative for: Lupus Erythematosus; Raynaudos; Scleroderma Musculoskeletal Paul Cross, Paul Cross. ([id number]) Complaints and Symptoms: No Complaints or Symptoms Medical History: Negative for: Gout; Rheumatoid Arthritis; Osteoarthritis; Osteomyelitis Neurologic Complaints and Symptoms: No Complaints or Symptoms Medical History: Positive for: Dementia - mild Negative for: Neuropathy; Quadriplegia; Paraplegia Oncologic Complaints and Symptoms: No Complaints or Symptoms Medical History: Negative for: Received Chemotherapy; Received Radiation Psychiatric Complaints and Symptoms: No Complaints or Symptoms Medical History: Negative for: Anorexia/bulimia; Confinement Anxiety Immunizations Pneumococcal Vaccine: Received  Pneumococcal Vaccination: Yes Implantable Devices Hospitalization / Surgery History Name of Hospital Purpose of Hospitalization/[place] Date [place] breathing 01/01/2018 Family and Social History Former smoker; Marital Status - Widowed; Alcohol Use: Never; Drug Use: No History; Caffeine Use: Daily; Advanced Directives: No; Patient does not want information on Advanced Directives; Living Will: No; Medical Power of Attorney: No Electronic Signature(s) Signed: 07/19/2018 5:40:20 PM By: Gretta Cool, BSN, RN, CWS, Kim RN, BSN Signed: 07/19/2018 9:52:35 PM By: Worthy Keeler PA-C Entered By: Gretta Cool, BSN, RN, CWS, Kim on 07/19/2018 09:07:12 Paul Cross ([id number]) -------------------------------------------------------------------------------- [place] Details Patient Name: Paul Cross. Date of Service: 07/19/2018 Medical Record Number: [id number] Patient Account Number: [id number] Date of Birth/Sex: 10-09-1917 (85 y.o. M) Treating RN: Harold Barban Primary Care Provider: Caryl Bis, ERIC Other Clinician: Referring Provider: Caryl Bis, ERIC Treating Provider/Extender: Melburn Hake, Jarmarcus Wambold Weeks in Treatment: 0 Diagnosis Coding ICD-10 Codes Code Description L98.8 Other specified disorders of the skin and subcutaneous tissue B35.4 Tinea corporis J44.9 Chronic obstructive pulmonary disease, unspecified Z99.81 Dependence on supplemental oxygen I10 Essential (primary) hypertension Facility Procedures CPT4 Code: [id number] Description: [phone] - WOUND CARE VISIT-LEV 2 EST PT Modifier: Quantity: 1 Physician Procedures CPT4 Code: [id number] Description: WC PHYS LEVEL 3 o NEW PT ICD-10 Diagnosis Description L98.8 Other specified disorders of the skin and subcutaneous B35.4 Tinea corporis J44.9 Chronic obstructive pulmonary disease, unspecified Z99.81 Dependence on supplemental oxygen Modifier: tissue Quantity: 1 Electronic Signature(s) Signed: 07/19/2018 9:52:35 PM By: Worthy Keeler  PA-C Entered By: Worthy Keeler on 07/19/2018 15:50:55

## 2018-08-15 ENCOUNTER — Ambulatory Visit (INDEPENDENT_AMBULATORY_CARE_PROVIDER_SITE_OTHER): Payer: Medicare Other | Admitting: Pulmonary Disease

## 2018-08-15 ENCOUNTER — Encounter: Payer: Self-pay | Admitting: Pulmonary Disease

## 2018-08-15 VITALS — BP 130/60 | HR 64 | Ht 72.0 in | Wt 193.6 lb

## 2018-08-15 DIAGNOSIS — J9611 Chronic respiratory failure with hypoxia: Secondary | ICD-10-CM | POA: Diagnosis not present

## 2018-08-15 DIAGNOSIS — J849 Interstitial pulmonary disease, unspecified: Secondary | ICD-10-CM | POA: Diagnosis not present

## 2018-08-15 DIAGNOSIS — J432 Centrilobular emphysema: Secondary | ICD-10-CM | POA: Diagnosis not present

## 2018-08-15 DIAGNOSIS — R0602 Shortness of breath: Secondary | ICD-10-CM | POA: Diagnosis not present

## 2018-08-15 NOTE — Patient Instructions (Signed)
Chronic respiratory failure with hypoxemia: Keep using oxygen as you are doing  Shortness of breath: I think it is important to stay as physically active as possible and to continue participating in pulmonary rehab  Centrilobular emphysema/COPD: Continue Symbicort Continue Spiriva Practice good hand hygiene Use albuterol prior to exercise  Chronic fibrotic lung disease: This process does not appear to be progressing, if you feel more shortness of breath however we can always get another CT scan of your chest if you desire.  At this time I do not think you need to have another CT scan  Feeling unsteady: I will reach out to Dr. Caryl Bis about this to see if there is something called vestibular rehab available in [place].  We will see you back in 4 to 6 months or sooner if needed

## 2018-08-15 NOTE — Progress Notes (Signed)
Synopsis: Referred in August 2019 for Dyspnea, hypoxemia.  He smoked 1 pack of cigarettes daily for at least 50 years, quit around age 85.  He worked as a Dietitian over the years.  Served in [employer] War [employer] in [place] and in [place].  Subjective:   [name] GENDER: male DOB: May 22, 1918, MRN: [id number]   HPI  Chief Complaint  Patient presents with  . Follow-up    still short of breath when walking   Bug says that he has been feeling more unsteady while walking recently.  Specifically he says when he stands from a seated position he will feel more dizzy.  He has talked to his primary care doctor at the [place] about this and he plans to talk to his primary care doctor in [place] about it.  He has been going to pulmonary rehab twice a week and he says that this is going well.  He is still taking Symbicort and Spiriva.  He does not use his albuterol nebulizer very often.  He has not been sick with bronchitis or pneumonia since last visit.  Past Medical History:  Diagnosis Date  . Hyperlipidemia   . Hypertension   . Hypothyroidism    approximately 2009      Review of Systems  Constitutional: Positive for weight loss. Negative for fever.  HENT: Negative for congestion, ear pain, nosebleeds and sore throat.   Eyes: Negative for redness.  Respiratory: Positive for cough and shortness of breath. Negative for wheezing.   Cardiovascular: Positive for leg swelling. Negative for palpitations and PND.  Gastrointestinal: Negative for nausea and vomiting.  Genitourinary: Negative for dysuria.  Skin: Positive for rash.  Neurological: Negative for headaches.  Endo/Heme/Allergies: Bruises/bleeds easily.  Psychiatric/Behavioral: Negative for depression. The patient is nervous/anxious.       Objective:  Physical Exam   Vitals:   08/15/18 0856  BP: 130/60  Pulse: 64  SpO2: 91%  Weight: 193 lb 9.6 oz (87.8 kg)  Height: 6' (1.829 m)   2L   Gen: elderly male, well  appearing HENT: OP clear, TM's clear, neck supple PULM: Crackles bases B, normal percussion CV: RRR, no mgr, trace edema GI: BS+, soft, nontender Derm: no cyanosis or rash Psyche: normal mood and affect     CBC    Component Value Date/Time   WBC 6.8 01/15/2018 0844   RBC 4.30 (L) 01/15/2018 0844   HGB 14.2 01/15/2018 0844   HCT 41.4 01/15/2018 0844   PLT 220 01/15/2018 0844   MCV 96.2 01/15/2018 0844   MCH 32.9 01/15/2018 0844   MCHC 34.2 01/15/2018 0844   RDW 13.6 01/15/2018 0844   LYMPHSABS 1.7 01/15/2018 0844   MONOABS 0.8 01/15/2018 0844   EOSABS 0.2 01/15/2018 0844   BASOSABS 0.1 01/15/2018 0844     Chest imaging: July 2019 CT chest > images independently reviewed showing significant centrilobular emphysema and paraseptal emphysema and an upper lobe predominant distribution.  There is also upper lobe predominant honeycombing and a peripheral based distribution with interstitial reticulation, specifically intralobular septal thickening.  Again the fibrotic changes are in an upper lobe predominant fashion though they are notable in the bases as well.  There is also an area of consolidation in the left upper lobe.  There is a very large bulla in the right lower lobe. October 2019 high-resolution CT chest images independently reviewed showing significant emphysema bilaterally, fibrotic changes in the bases, likely honeycombing, large bulla unchanged, scattered nodules.  Pulmonary infiltrate  has cleared up.  PFT:  Labs:  Path:  Echo: July 2019 Echo: LVEF 60-65%, mod LVH, RV dilated, RVSP 35-40   Heart Catheterization:        Assessment & Plan:   Interstitial pulmonary disease (HCC)  Chronic respiratory failure with hypoxia (HCC)  Centrilobular emphysema (HCC)  Shortness of breath  Discussion: From a respiratory standpoint I do not see a significant progression in symptoms since the last visit.  I did tell him today that his lungs will wear out as well  everything else in his body as he ages but I do not think that this is a rapidly progressive process.  He had a lot of questions about his lung disease today and I explained to him that we could get another CT scan of his chest if he wanted, however I do not think that anything that is going on in his lungs is a rapidly progressive process.  We ultimately decided to not undergo any more CT scan.  Plan: Chronic respiratory failure with hypoxemia: Keep using oxygen as you are doing  Shortness of breath: I think it is important to stay as physically active as possible and to continue participating in pulmonary rehab  Centrilobular emphysema/COPD: Continue Symbicort Continue Spiriva Practice good hand hygiene Use albuterol prior to exercise  Chronic fibrotic lung disease: This process does not appear to be progressing, if you feel more shortness of breath however we can always get another CT scan of your chest if you desire.  At this time I do not think you need to have another CT scan  Feeling unsteady: I will reach out to Dr. Caryl Bis about this to see if there is something called vestibular rehab available in [place].  We will see you back in 4 to 6 months or sooner if needed  Greater than 50% of this 25-minute visit was spent face-to-face   Current Outpatient Medications:  .  acetaminophen (TYLENOL) 325 MG tablet, Take 975 mg by mouth 3 (three) times daily as needed for mild pain, moderate pain, fever or headache., Disp: , Rfl:  .  amLODipine (NORVASC) 5 MG tablet, Take 5 mg by mouth daily., Disp: , Rfl:  .  aspirin EC 81 MG tablet, Take 81 mg by mouth daily., Disp: , Rfl:  .  atorvastatin (LIPITOR) 40 MG tablet, Take 20 mg by mouth at bedtime., Disp: , Rfl:  .  budesonide-formoterol (SYMBICORT) 160-4.5 MCG/ACT inhaler, Inhale 2 puffs into the lungs 2 (two) times daily., Disp: , Rfl:  .  cholecalciferol (VITAMIN D) 1000 units tablet, Take 1,000 Units by mouth daily., Disp: , Rfl:   .  hydrocortisone cream 1 %, Apply 1 application topically 2 (two) times daily. FOR TAILBONE, Disp: , Rfl:  .  levothyroxine (SYNTHROID, LEVOTHROID) 137 MCG tablet, Take 137 mcg by mouth daily before breakfast. , Disp: , Rfl:  .  Multiple Vitamin (MULTIVITAMIN) capsule, Take 1 capsule by mouth daily., Disp: , Rfl:  .  tamsulosin (FLOMAX) 0.4 MG CAPS capsule, Take 2 capsules (0.8 mg total) by mouth daily. (Patient taking differently: Take 0.4 mg by mouth daily. ), Disp: 60 capsule, Rfl: 2 .  tiotropium (SPIRIVA HANDIHALER) 18 MCG inhalation capsule, Place 1 capsule (18 mcg total) into inhaler and inhale daily., Disp: 30 capsule, Rfl: 11 .  Tiotropium Bromide Monohydrate (SPIRIVA RESPIMAT) 2.5 MCG/ACT AERS, Inhale 2 puffs into the lungs daily., Disp: 1 Inhaler, Rfl: 11 .  vitamin B-12 (CYANOCOBALAMIN) 1000 MCG tablet, Take 1,000 mcg by mouth  daily., Disp: , Rfl:  No current facility-administered medications for this visit.   Facility-Administered Medications Ordered in Other Visits:  .  albuterol (PROVENTIL) (2.5 MG/3ML) 0.083% nebulizer solution 2.5 mg, 2.5 mg, Nebulization, Once, Juanito Doom, MD

## 2018-08-16 NOTE — Telephone Encounter (Signed)
BQ - please clarify that you are okay with referring him to [facility] pulmonary rehab. Thanks!

## 2018-08-19 ENCOUNTER — Telehealth: Payer: Self-pay | Admitting: Family Medicine

## 2018-08-19 DIAGNOSIS — R2689 Other abnormalities of gait and mobility: Secondary | ICD-10-CM

## 2018-08-19 NOTE — Telephone Encounter (Signed)
-----   Message from Juanito Doom, MD sent at 08/16/2018  3:18 PM EST ----- Sure if you know who to send him to, thanks. ----- Message ----- From: Leone Haven, MD Sent: 08/16/2018   3:06 PM EST To: Juanito Doom, MD  Hi,  There is vestibular rehab here. Would you like for me to arrange this for him?  Randall Hiss  ----- Message ----- From: Juanito Doom, MD Sent: 08/15/2018   9:25 AM EST To: Leone Haven, MD  Hi He reports feeling unsteady, do you know if there is something like vestibular rehab available in [place]? Thanks, Aaron Edelman

## 2018-08-19 NOTE — Telephone Encounter (Signed)
See message below. Referral for vestibular rehab placed.  Please let the patient know that I received a message from his pulmonologist regarding feeling unsteady.  I have placed a referral for physical therapy for this.  Please check with the patient to determine what issues he is having with his balance.  Thanks.

## 2018-08-20 NOTE — Telephone Encounter (Signed)
This patient has a referral placed for physical therapy and his daughter states she would like the referal at [facility]  And the fax number is [phone].  Thanks.  Gae Bon, CMA

## 2018-08-27 NOTE — Progress Notes (Signed)
YANCEY, PEDLEY ([id number]) Visit Report for 07/19/2018 Allergy List Details Patient Name: Paul Cross, Paul Cross. Date of Service: 07/19/2018 8:45 AM Medical Record Number: [id number] Patient Account Number: [id number] Date of Birth/Sex: [birth year] (85 y.o. M) Treating RN: Cornell Barman Primary Care Yazlin Ekblad: Caryl Bis, ERIC Other Clinician: Referring Dilynn Munroe: Caryl Bis, ERIC Treating Rashaud Ybarbo/Extender: Melburn Hake, Paul Cross Weeks in Treatment: 0 Allergies Active Allergies Benedryl Allergy Notes Electronic Signature(s) Signed: 07/19/2018 5:40:20 PM By: Gretta Cool, BSN, RN, CWS, Kim RN, BSN Entered By: Gretta Cool, BSN, RN, CWS, Kim on 07/19/2018 09:01:13 Paul Cross ([id number]) -------------------------------------------------------------------------------- Arrival Information Details Patient Name: Paul Cross Date of Service: 07/19/2018 8:45 AM Medical Record Number: [id number] Patient Account Number: [id number] Date of Birth/Sex: 1917-12-23 (85 y.o. M) Treating RN: Harold Barban Primary Care Priscillia Fouch: Caryl Bis, ERIC Other Clinician: Referring Winnona Wargo: Caryl Bis, ERIC Treating Stacey Maura/Extender: Melburn Hake, Paul Cross Weeks in Treatment: 0 Visit Information Patient Arrived: Walker Arrival Time: 08:52 Accompanied By: daughter Transfer Assistance: None Patient Identification Verified: Yes Secondary Verification Process Completed: Yes Electronic Signature(s) Signed: 07/19/2018 11:23:59 AM By: Lorine Bears RCP, RRT, CHT Entered By: Lorine Bears on 07/19/2018 08:53:04 Paul Cross ([id number]) -------------------------------------------------------------------------------- Clinic Level of Care Assessment Details Patient Name: Paul Cross. Date of Service: 07/19/2018 8:45 AM Medical Record Number: [id number] Patient Account Number: [id number] Date of Birth/Sex: 11-Jan-1918 (85 y.o. M) Treating RN: Harold Barban Primary  Care Dove Gresham: Caryl Bis, ERIC Other Clinician: Referring Ilona Colley: Caryl Bis, ERIC Treating Deyra Perdomo/Extender: Melburn Hake, Paul Cross Weeks in Treatment: 0 Clinic Level of Care Assessment Items TOOL 4 Quantity Score []  - Use when only an EandM is performed on FOLLOW-UP visit 0 ASSESSMENTS - Nursing Assessment / Reassessment X - Reassessment of Co-morbidities (includes updates in patient status) 1 10 X- 1 5 Reassessment of Adherence to Treatment Plan ASSESSMENTS - Wound and Skin Assessment / Reassessment X - Simple Wound Assessment / Reassessment - one wound 1 5 []  - 0 Complex Wound Assessment / Reassessment - multiple wounds []  - 0 Dermatologic / Skin Assessment (not related to wound area) ASSESSMENTS - Focused Assessment []  - Circumferential Edema Measurements - multi extremities 0 []  - 0 Nutritional Assessment / Counseling / Intervention []  - 0 Lower Extremity Assessment (monofilament, tuning fork, pulses) []  - 0 Peripheral Arterial Disease Assessment (using hand held doppler) ASSESSMENTS - Ostomy and/or Continence Assessment and Care []  - Incontinence Assessment and Management 0 []  - 0 Ostomy Care Assessment and Management (repouching, etc.) PROCESS - Coordination of Care []  - Simple Patient / Family Education for ongoing care 0 []  - 0 Complex (extensive) Patient / Family Education for ongoing care X- 1 10 Staff obtains Programmer, systems, Records, Test Results / Process Orders []  - 0 Staff telephones HHA, Nursing Homes / Clarify orders / etc []  - 0 Routine Transfer to another Facility (non-emergent condition) []  - 0 Routine Hospital Admission (non-emergent condition) []  - 0 New Admissions / Biomedical engineer / Ordering NPWT, Apligraf, etc. []  - 0 Emergency Hospital Admission (emergent condition) X- 1 10 Simple Discharge Coordination Paul Cross, Paul L. ([id number]) []  - 0 Complex (extensive) Discharge Coordination PROCESS - Special Needs []  - Pediatric / Minor  Patient Management 0 []  - 0 Isolation Patient Management []  - 0 Hearing / Language / Visual special needs []  - 0 Assessment of Community assistance (transportation, D/C planning, etc.) []  - 0 Additional assistance / Altered mentation []  - 0 Support Surface(s) Assessment (bed, cushion, seat, etc.) INTERVENTIONS - Wound Cleansing / Measurement X - Simple Wound Cleansing - one wound 1 5 []  -  0 Complex Wound Cleansing - multiple wounds X- 1 5 Wound Imaging (photographs - any number of wounds) []  - 0 Wound Tracing (instead of photographs) X- 1 5 Simple Wound Measurement - one wound []  - 0 Complex Wound Measurement - multiple wounds INTERVENTIONS - Wound Dressings X - Small Wound Dressing one or multiple wounds 1 10 []  - 0 Medium Wound Dressing one or multiple wounds []  - 0 Large Wound Dressing one or multiple wounds []  - 0 Application of Medications - topical []  - 0 Application of Medications - injection INTERVENTIONS - Miscellaneous []  - External ear exam 0 []  - 0 Specimen Collection (cultures, biopsies, blood, body fluids, etc.) []  - 0 Specimen(s) / Culture(s) sent or taken to Lab for analysis []  - 0 Patient Transfer (multiple staff / Civil Service fast streamer / Similar devices) []  - 0 Simple Staple / Suture removal (25 or less) []  - 0 Complex Staple / Suture removal (26 or more) []  - 0 Hypo / Hyperglycemic Management (close monitor of Blood Glucose) []  - 0 Ankle / Brachial Index (ABI) - do not check if billed separately X- 1 5 Vital Signs Paul Cross, Paul L. ([id number]) Has the patient been seen at the hospital within the last three years: Yes Total Score: 70 Level Of Care: New/Established - Level 2 Electronic Signature(s) Signed: 08/27/2018 10:53:34 AM By: Harold Barban Entered By: Harold Barban on 07/19/2018 09:27:31 Paul Cross ([id number]) -------------------------------------------------------------------------------- Encounter Discharge Information  Details Patient Name: Paul Cross. Date of Service: 07/19/2018 8:45 AM Medical Record Number: [id number] Patient Account Number: [id number] Date of Birth/Sex: 1917-09-15 (85 y.o. M) Treating RN: Cornell Barman Primary Care Vencil Basnett: Caryl Bis, ERIC Other Clinician: Referring Jennyfer Nickolson: Caryl Bis, ERIC Treating Elyce Zollinger/Extender: Melburn Hake, Paul Cross Weeks in Treatment: 0 Encounter Discharge Information Items Discharge Condition: Stable Ambulatory Status: [place] Discharge Destination: Home Transportation: Private Auto Accompanied By: daughter Schedule Follow-up Appointment: Yes Clinical Summary of Care: Electronic Signature(s) Signed: 07/19/2018 5:40:20 PM By: Gretta Cool, BSN, RN, CWS, Kim RN, BSN Entered By: Gretta Cool, BSN, RN, CWS, Kim on 07/19/2018 09:48:16 Paul Cross ([id number]) -------------------------------------------------------------------------------- Lower Extremity Assessment Details Patient Name: Paul Cross, Paul Cross. Date of Service: 07/19/2018 8:45 AM Medical Record Number: [id number] Patient Account Number: [id number] Date of Birth/Sex: [birth year] (85 y.o. M) Treating RN: Cornell Barman Primary Care Oluwatoni Rotunno: Caryl Bis, ERIC Other Clinician: Referring Shanty Ginty: Caryl Bis, ERIC Treating Lillyauna Jenkinson/Extender: Sharalyn Ink in Treatment: 0 Electronic Signature(s) Signed: 07/19/2018 5:40:20 PM By: Gretta Cool, BSN, RN, CWS, Kim RN, BSN Entered By: Gretta Cool, BSN, RN, CWS, Kim on 07/19/2018 09:13:50 Paul Cross ([id number]) -------------------------------------------------------------------------------- Multi Wound Chart Details Patient Name: Paul Cross Date of Service: 07/19/2018 8:45 AM Medical Record Number: [id number] Patient Account Number: [id number] Date of Birth/Sex: [birth year] (85 y.o. M) Treating RN: Harold Barban Primary Care Syra Sirmons: Caryl Bis, ERIC Other Clinician: Referring Bryam Taborda: Caryl Bis, ERIC Treating Quinterius Gaida/Extender:  Paul Cross, Paul Cross Weeks in Treatment: 0 Vital Signs Height(in): 72 Pulse(bpm): 67 Weight(lbs): 194.2 Blood Pressure(mmHg): 145/65 Body Mass Index(BMI): 26 Temperature(F): 97.8 Respiratory Rate 16 (breaths/min): Wound Assessments Treatment Notes Electronic Signature(s) Signed: 08/27/2018 10:53:34 AM By: Harold Barban Entered By: Harold Barban on 07/19/2018 09:24:26 Paul Cross ([id number]) -------------------------------------------------------------------------------- [place] Details Patient Name: Paul Cross. Date of Service: 07/19/2018 8:45 AM Medical Record Number: [id number] Patient Account Number: [id number] Date of Birth/Sex: 1917/12/29 (85 y.o. M) Treating RN: Harold Barban Primary Care Aryav Wimberly: Caryl Bis, ERIC Other Clinician: Referring Terie Lear: Caryl Bis, ERIC Treating Myya Meenach/Extender: Melburn Hake, Paul Cross Weeks in Treatment: 0 Active Inactive Electronic Signature(s) Signed: 08/27/2018 10:53:34  AM By: Harold Barban Entered By: Harold Barban on 07/19/2018 12:29:44 Paul Cross ([id number]) -------------------------------------------------------------------------------- Pain Assessment Details Patient Name: Paul Cross. Date of Service: 07/19/2018 8:45 AM Medical Record Number: [id number] Patient Account Number: [id number] Date of Birth/Sex: July 26, 1917 (85 y.o. M) Treating RN: Harold Barban Primary Care Wanda Rideout: Caryl Bis, ERIC Other Clinician: Referring Jazzma Neidhardt: Caryl Bis, ERIC Treating Chastelyn Athens/Extender: Melburn Hake, Paul Cross Weeks in Treatment: 0 Active Problems Location of Pain Severity and Description of Pain Patient Has Paino Yes Site Locations Rate the pain. Current Pain Level: 5 Pain Management and Medication Current Pain Management: Electronic Signature(s) Signed: 07/19/2018 11:23:59 AM By: Lorine Bears RCP, RRT, CHT Signed: 08/27/2018 10:53:34 AM By: Harold Barban Entered By: Lorine Bears on 07/19/2018 08:53:49 Paul Cross ([id number]) -------------------------------------------------------------------------------- Patient/Caregiver Education Details Patient Name: Paul Cross Date of Service: 07/19/2018 8:45 AM Medical Record Number: [id number] Patient Account Number: [id number] Date of Birth/Gender: 08-01-1917 (85 y.o. M) Treating RN: Harold Barban Primary Care Physician: Caryl Bis, ERIC Other Clinician: Referring Physician: Caryl Bis, ERIC Treating Physician/Extender: Sharalyn Ink in Treatment: 0 Education Assessment Education Provided To: Patient Education Topics Provided Welcome To [facility]: Handouts: Welcome To [facility] Methods: Demonstration, Explain/Verbal Responses: State content correctly Wound/Skin Impairment: Handouts: Caring for Your Ulcer Methods: Demonstration, Explain/Verbal Responses: State content correctly Electronic Signature(s) Signed: 08/27/2018 10:53:34 AM By: Harold Barban Entered By: Harold Barban on 07/19/2018 09:24:53 Paul Cross ([id number]) -------------------------------------------------------------------------------- [place] Details Patient Name: Paul Cross. Date of Service: 07/19/2018 8:45 AM Medical Record Number: [id number] Patient Account Number: [id number] Date of Birth/Sex: [birth year] (85 y.o. M) Treating RN: Harold Barban Primary Care Irem Stoneham: Caryl Bis, ERIC Other Clinician: Referring Arlisa Leclere: Caryl Bis, ERIC Treating Jaleiah Asay/Extender: Melburn Hake, Paul Cross Weeks in Treatment: 0 Vital Signs Time Taken: 08:54 Temperature (F): 97.8 Height (in): 72 Pulse (bpm): 67 Source: Stated Respiratory Rate (breaths/min): 16 Weight (lbs): 194.2 Blood Pressure (mmHg): 145/65 Source: Measured Reference Range: 80 - 120 mg / dl Body Mass Index (BMI): 26.3 Electronic Signature(s) Signed: 07/19/2018  11:23:59 AM By: Lorine Bears RCP, RRT, CHT Entered By: Lorine Bears on 07/19/2018 08:57:24

## 2018-09-06 ENCOUNTER — Ambulatory Visit: Payer: Self-pay | Admitting: *Deleted

## 2018-09-06 ENCOUNTER — Encounter (HOSPITAL_COMMUNITY): Payer: Self-pay

## 2018-09-06 ENCOUNTER — Emergency Department: Payer: Medicare Other

## 2018-09-06 ENCOUNTER — Other Ambulatory Visit: Payer: Self-pay

## 2018-09-06 ENCOUNTER — Encounter: Payer: Self-pay | Admitting: Emergency Medicine

## 2018-09-06 ENCOUNTER — Observation Stay (HOSPITAL_COMMUNITY)
Admission: AD | Admit: 2018-09-06 | Discharge: 2018-09-07 | Disposition: A | Discharge status: Skilled Nursing Facility | Payer: Medicare Other | Source: Other Acute Inpatient Hospital | Attending: Cardiology | Admitting: Cardiology

## 2018-09-06 ENCOUNTER — Emergency Department (EMERGENCY_DEPARTMENT_HOSPITAL)
Admission: EM | Admit: 2018-09-06 | Discharge: 2018-09-06 | Disposition: A | Discharge status: Other Acute Inpatient Hospital | Payer: Medicare Other | Source: Home / Self Care | Attending: Emergency Medicine | Admitting: Emergency Medicine

## 2018-09-06 ENCOUNTER — Ambulatory Visit (HOSPITAL_COMMUNITY)
Admission: AD | Disposition: A | Discharge status: Skilled Nursing Facility | Payer: Self-pay | Source: Other Acute Inpatient Hospital | Attending: Cardiology

## 2018-09-06 DIAGNOSIS — Z823 Family history of stroke: Secondary | ICD-10-CM | POA: Diagnosis not present

## 2018-09-06 DIAGNOSIS — R2689 Other abnormalities of gait and mobility: Secondary | ICD-10-CM | POA: Insufficient documentation

## 2018-09-06 DIAGNOSIS — Z79899 Other long term (current) drug therapy: Secondary | ICD-10-CM | POA: Insufficient documentation

## 2018-09-06 DIAGNOSIS — R0902 Hypoxemia: Secondary | ICD-10-CM

## 2018-09-06 DIAGNOSIS — I10 Essential (primary) hypertension: Secondary | ICD-10-CM

## 2018-09-06 DIAGNOSIS — R55 Syncope and collapse: Secondary | ICD-10-CM

## 2018-09-06 DIAGNOSIS — E785 Hyperlipidemia, unspecified: Secondary | ICD-10-CM | POA: Diagnosis not present

## 2018-09-06 DIAGNOSIS — Z888 Allergy status to other drugs, medicaments and biological substances status: Secondary | ICD-10-CM | POA: Diagnosis not present

## 2018-09-06 DIAGNOSIS — I214 Non-ST elevation (NSTEMI) myocardial infarction: Secondary | ICD-10-CM | POA: Diagnosis not present

## 2018-09-06 DIAGNOSIS — J849 Interstitial pulmonary disease, unspecified: Secondary | ICD-10-CM | POA: Diagnosis not present

## 2018-09-06 DIAGNOSIS — Z8249 Family history of ischemic heart disease and other diseases of the circulatory system: Secondary | ICD-10-CM | POA: Insufficient documentation

## 2018-09-06 DIAGNOSIS — Z7951 Long term (current) use of inhaled steroids: Secondary | ICD-10-CM | POA: Diagnosis not present

## 2018-09-06 DIAGNOSIS — Z79890 Hormone replacement therapy (postmenopausal): Secondary | ICD-10-CM | POA: Insufficient documentation

## 2018-09-06 DIAGNOSIS — E039 Hypothyroidism, unspecified: Secondary | ICD-10-CM | POA: Diagnosis not present

## 2018-09-06 DIAGNOSIS — Z87891 Personal history of nicotine dependence: Secondary | ICD-10-CM | POA: Diagnosis not present

## 2018-09-06 DIAGNOSIS — Z7982 Long term (current) use of aspirin: Secondary | ICD-10-CM

## 2018-09-06 DIAGNOSIS — R079 Chest pain, unspecified: Secondary | ICD-10-CM | POA: Insufficient documentation

## 2018-09-06 DIAGNOSIS — I441 Atrioventricular block, second degree: Principal | ICD-10-CM | POA: Diagnosis present

## 2018-09-06 DIAGNOSIS — I272 Pulmonary hypertension, unspecified: Secondary | ICD-10-CM | POA: Insufficient documentation

## 2018-09-06 DIAGNOSIS — R531 Weakness: Secondary | ICD-10-CM | POA: Insufficient documentation

## 2018-09-06 DIAGNOSIS — N179 Acute kidney failure, unspecified: Secondary | ICD-10-CM | POA: Diagnosis not present

## 2018-09-06 DIAGNOSIS — I442 Atrioventricular block, complete: Secondary | ICD-10-CM | POA: Diagnosis not present

## 2018-09-06 DIAGNOSIS — Z9981 Dependence on supplemental oxygen: Secondary | ICD-10-CM | POA: Diagnosis not present

## 2018-09-06 DIAGNOSIS — J9610 Chronic respiratory failure, unspecified whether with hypoxia or hypercapnia: Secondary | ICD-10-CM | POA: Insufficient documentation

## 2018-09-06 DIAGNOSIS — Z95818 Presence of other cardiac implants and grafts: Secondary | ICD-10-CM

## 2018-09-06 DIAGNOSIS — J9621 Acute and chronic respiratory failure with hypoxia: Secondary | ICD-10-CM | POA: Diagnosis not present

## 2018-09-06 DIAGNOSIS — R001 Bradycardia, unspecified: Secondary | ICD-10-CM | POA: Insufficient documentation

## 2018-09-06 DIAGNOSIS — J9611 Chronic respiratory failure with hypoxia: Secondary | ICD-10-CM

## 2018-09-06 HISTORY — PX: PACEMAKER IMPLANT: EP1218

## 2018-09-06 LAB — CBC WITH DIFFERENTIAL/PLATELET
Abs Immature Granulocytes: 0.04 10*3/uL (ref 0.00–0.07)
BASOS ABS: 0 10*3/uL (ref 0.0–0.1)
Basophils Relative: 0 %
EOS ABS: 0.1 10*3/uL (ref 0.0–0.5)
EOS PCT: 1 %
HEMATOCRIT: 38.3 % — AB (ref 39.0–52.0)
Hemoglobin: 12.6 g/dL — ABNORMAL LOW (ref 13.0–17.0)
Immature Granulocytes: 1 %
LYMPHS ABS: 1.4 10*3/uL (ref 0.7–4.0)
Lymphocytes Relative: 19 %
MCH: 33 pg (ref 26.0–34.0)
MCHC: 32.9 g/dL (ref 30.0–36.0)
MCV: 100.3 fL — AB (ref 80.0–100.0)
Monocytes Absolute: 0.8 10*3/uL (ref 0.1–1.0)
Monocytes Relative: 11 %
NRBC: 0 % (ref 0.0–0.2)
Neutro Abs: 5 10*3/uL (ref 1.7–7.7)
Neutrophils Relative %: 68 %
Platelets: 144 10*3/uL — ABNORMAL LOW (ref 150–400)
RBC: 3.82 MIL/uL — ABNORMAL LOW (ref 4.22–5.81)
RDW: 13.2 % (ref 11.5–15.5)
WBC: 7.2 10*3/uL (ref 4.0–10.5)

## 2018-09-06 LAB — COMPREHENSIVE METABOLIC PANEL
ALK PHOS: 35 U/L — AB (ref 38–126)
ALT: 16 U/L (ref 0–44)
AST: 18 U/L (ref 15–41)
Albumin: 3.9 g/dL (ref 3.5–5.0)
Anion gap: 8 (ref 5–15)
BILIRUBIN TOTAL: 0.9 mg/dL (ref 0.3–1.2)
BUN: 47 mg/dL — ABNORMAL HIGH (ref 8–23)
CALCIUM: 9.5 mg/dL (ref 8.9–10.3)
CO2: 24 mmol/L (ref 22–32)
CREATININE: 1.61 mg/dL — AB (ref 0.61–1.24)
Chloride: 104 mmol/L (ref 98–111)
GFR calc Af Amer: 41 mL/min — ABNORMAL LOW (ref >60)
GFR, EST NON AFRICAN AMERICAN: 35 mL/min — AB (ref >60)
Glucose, Bld: 121 mg/dL — ABNORMAL HIGH (ref 70–99)
Potassium: 5 mmol/L (ref 3.5–5.1)
Sodium: 136 mmol/L (ref 135–145)
Total Protein: 6.8 g/dL (ref 6.5–8.1)

## 2018-09-06 LAB — TROPONIN I: Troponin I: 0.03 ng/mL (ref <0.03)

## 2018-09-06 SURGERY — PACEMAKER IMPLANT

## 2018-09-06 MED ORDER — CHLORHEXIDINE GLUCONATE 4 % EX LIQD
60.0000 mL | Freq: Once | CUTANEOUS | Status: DC
  Filled 2018-09-06: qty 60

## 2018-09-06 MED ORDER — UMECLIDINIUM BROMIDE 62.5 MCG/INH IN AEPB
1.0000 | INHALATION_SPRAY | Freq: Every day | RESPIRATORY_TRACT | Status: DC
  Administered 2018-09-07: 1 via RESPIRATORY_TRACT
  Filled 2018-09-06: qty 7

## 2018-09-06 MED ORDER — HYDROCORTISONE 1 % EX CREA
1.0000 "application " | TOPICAL_CREAM | Freq: Two times a day (BID) | CUTANEOUS | Status: DC
  Administered 2018-09-06 – 2018-09-07 (×2): 1 via TOPICAL
  Filled 2018-09-06: qty 28

## 2018-09-06 MED ORDER — SODIUM CHLORIDE 0.9% FLUSH: 3.0000 mL | INTRAVENOUS | Status: DC | PRN

## 2018-09-06 MED ORDER — ACETAMINOPHEN 325 MG PO TABS: 325.0000 mg | ORAL_TABLET | ORAL | Status: DC | PRN

## 2018-09-06 MED ORDER — SODIUM CHLORIDE 0.9 % IV SOLN
INTRAVENOUS | Status: AC
  Filled 2018-09-06: qty 2

## 2018-09-06 MED ORDER — TAMSULOSIN HCL 0.4 MG PO CAPS: 0.4000 mg | ORAL_CAPSULE | Freq: Every day | ORAL | Status: DC

## 2018-09-06 MED ORDER — ATROPINE SULFATE 1 MG/ML IJ SOLN: 0.5000 mg | Freq: Once | INTRAMUSCULAR | Status: DC

## 2018-09-06 MED ORDER — ONDANSETRON HCL 4 MG/2ML IJ SOLN: 4.0000 mg | Freq: Four times a day (QID) | INTRAMUSCULAR | Status: DC | PRN

## 2018-09-06 MED ORDER — FLUTICASONE FUROATE-VILANTEROL 200-25 MCG/INH IN AEPB
1.0000 | INHALATION_SPRAY | Freq: Every day | RESPIRATORY_TRACT | Status: DC
  Administered 2018-09-07: 1 via RESPIRATORY_TRACT
  Filled 2018-09-06: qty 28

## 2018-09-06 MED ORDER — SODIUM CHLORIDE 0.9 % IV SOLN
80.0000 mg | INTRAVENOUS | Status: AC
  Administered 2018-09-06: 80 mg

## 2018-09-06 MED ORDER — LIDOCAINE HCL 1 % IJ SOLN
INTRAMUSCULAR | Status: AC
  Filled 2018-09-06: qty 60

## 2018-09-06 MED ORDER — SODIUM CHLORIDE 0.9 % IV SOLN: 250.0000 mL | INTRAVENOUS | Status: DC

## 2018-09-06 MED ORDER — CEFAZOLIN SODIUM-DEXTROSE 2-4 GM/100ML-% IV SOLN
INTRAVENOUS | Status: AC
  Filled 2018-09-06: qty 100

## 2018-09-06 MED ORDER — SODIUM CHLORIDE 0.9 % IV SOLN: INTRAVENOUS | Status: DC

## 2018-09-06 MED ORDER — VITAMIN D 25 MCG (1000 UNIT) PO TABS
1000.0000 [IU] | ORAL_TABLET | Freq: Every day | ORAL | Status: DC
  Administered 2018-09-07: 1000 [IU] via ORAL
  Filled 2018-09-06: qty 1

## 2018-09-06 MED ORDER — ATROPINE SULFATE 1 MG/10ML IJ SOSY
0.5000 mg | PREFILLED_SYRINGE | Freq: Once | INTRAMUSCULAR | Status: AC
  Administered 2018-09-06: 0.5 mg via INTRAVENOUS

## 2018-09-06 MED ORDER — HEPARIN (PORCINE) IN NACL 1000-0.9 UT/500ML-% IV SOLN
INTRAVENOUS | Status: AC
  Filled 2018-09-06: qty 500

## 2018-09-06 MED ORDER — TIOTROPIUM BROMIDE MONOHYDRATE 18 MCG IN CAPS: 18.0000 ug | ORAL_CAPSULE | Freq: Every day | RESPIRATORY_TRACT | Status: DC

## 2018-09-06 MED ORDER — VITAMIN B-12 1000 MCG PO TABS
1000.0000 ug | ORAL_TABLET | Freq: Every day | ORAL | Status: DC
  Administered 2018-09-07: 1000 ug via ORAL
  Filled 2018-09-06: qty 1

## 2018-09-06 MED ORDER — NITROGLYCERIN 0.4 MG SL SUBL: 0.4000 mg | SUBLINGUAL_TABLET | SUBLINGUAL | Status: DC | PRN

## 2018-09-06 MED ORDER — AMLODIPINE BESYLATE 2.5 MG PO TABS
5.0000 mg | ORAL_TABLET | Freq: Every day | ORAL | Status: DC
  Administered 2018-09-06 – 2018-09-07 (×2): 5 mg via ORAL
  Filled 2018-09-06 (×2): qty 2

## 2018-09-06 MED ORDER — CEFAZOLIN SODIUM-DEXTROSE 2-4 GM/100ML-% IV SOLN
2.0000 g | INTRAVENOUS | Status: AC
  Administered 2018-09-06: 2 g via INTRAVENOUS
  Filled 2018-09-06: qty 100

## 2018-09-06 MED ORDER — LIDOCAINE HCL (PF) 1 % IJ SOLN
INTRAMUSCULAR | Status: DC | PRN
  Administered 2018-09-06: 60 mL

## 2018-09-06 MED ORDER — MULTIVITAMINS PO CAPS: 1.0000 | ORAL_CAPSULE | Freq: Every day | ORAL | Status: DC

## 2018-09-06 MED ORDER — ASPIRIN EC 81 MG PO TBEC
81.0000 mg | DELAYED_RELEASE_TABLET | Freq: Every day | ORAL | Status: DC
  Administered 2018-09-07: 81 mg via ORAL
  Filled 2018-09-06: qty 1

## 2018-09-06 MED ORDER — UMECLIDINIUM BROMIDE 62.5 MCG/INH IN AEPB
1.0000 | INHALATION_SPRAY | Freq: Every day | RESPIRATORY_TRACT | Status: DC
  Filled 2018-09-06: qty 7

## 2018-09-06 MED ORDER — ASPIRIN EC 81 MG PO TBEC: 81.0000 mg | DELAYED_RELEASE_TABLET | Freq: Every day | ORAL | Status: DC

## 2018-09-06 MED ORDER — ADULT MULTIVITAMIN W/MINERALS CH
1.0000 | ORAL_TABLET | Freq: Every day | ORAL | Status: DC
  Administered 2018-09-07: 1 via ORAL
  Filled 2018-09-06: qty 1

## 2018-09-06 MED ORDER — CEFAZOLIN SODIUM-DEXTROSE 1-4 GM/50ML-% IV SOLN
1.0000 g | Freq: Four times a day (QID) | INTRAVENOUS | Status: AC
  Administered 2018-09-06 – 2018-09-07 (×2): 1 g via INTRAVENOUS
  Filled 2018-09-06 (×3): qty 50

## 2018-09-06 MED ORDER — LEVOTHYROXINE SODIUM 137 MCG PO TABS: 137.0000 ug | ORAL_TABLET | Freq: Every day | ORAL | Status: DC

## 2018-09-06 MED ORDER — HEPARIN (PORCINE) IN NACL 1000-0.9 UT/500ML-% IV SOLN
INTRAVENOUS | Status: DC | PRN
  Administered 2018-09-06: 500 mL

## 2018-09-06 MED ORDER — ACETAMINOPHEN 325 MG PO TABS: 975.0000 mg | ORAL_TABLET | Freq: Three times a day (TID) | ORAL | Status: DC | PRN

## 2018-09-06 MED ORDER — ATROPINE SULFATE 1 MG/10ML IJ SOSY
PREFILLED_SYRINGE | INTRAMUSCULAR | Status: AC
  Filled 2018-09-06: qty 20

## 2018-09-06 MED ORDER — SODIUM CHLORIDE 0.9% FLUSH: 3.0000 mL | Freq: Two times a day (BID) | INTRAVENOUS | Status: DC

## 2018-09-06 MED ORDER — ACETAMINOPHEN 325 MG PO TABS
650.0000 mg | ORAL_TABLET | ORAL | Status: DC | PRN
  Administered 2018-09-06: 650 mg via ORAL
  Filled 2018-09-06: qty 2

## 2018-09-06 MED ORDER — TAMSULOSIN HCL 0.4 MG PO CAPS
0.4000 mg | ORAL_CAPSULE | Freq: Every day | ORAL | Status: DC
  Administered 2018-09-06: 0.4 mg via ORAL
  Filled 2018-09-06: qty 1

## 2018-09-06 MED ORDER — LEVOTHYROXINE SODIUM 25 MCG PO TABS
137.0000 ug | ORAL_TABLET | Freq: Every day | ORAL | Status: DC
  Administered 2018-09-07: 137 ug via ORAL
  Filled 2018-09-06: qty 1

## 2018-09-06 MED ORDER — TIOTROPIUM BROMIDE MONOHYDRATE 2.5 MCG/ACT IN AERS: 2.0000 | INHALATION_SPRAY | Freq: Every day | RESPIRATORY_TRACT | Status: DC

## 2018-09-06 SURGICAL SUPPLY — 7 items
CABLE SURGICAL S-101-97-12 (CABLE) ×6 IMPLANT
LEAD TENDRIL MRI 52CM LPA1200M (Lead) ×3 IMPLANT
LEAD TENDRIL MRI 58CM LPA1200M (Lead) ×3 IMPLANT
PACEMAKER ASSURITY DR-RF (Pacemaker) ×3 IMPLANT
PAD PRO RADIOLUCENT 2001M-C (PAD) ×3 IMPLANT
SHEATH CLASSIC 8F (SHEATH) ×6 IMPLANT
TRAY PACEMAKER INSERTION (PACKS) ×3 IMPLANT

## 2018-09-06 NOTE — ED Notes (Signed)
Pt denies CP/SHOB/dizziness at this time.pt st feeling "dizzy only when walking" Pt c/o a headache that started yesterday.

## 2018-09-06 NOTE — ED Notes (Signed)
EMTALA reviewed by charge RN 

## 2018-09-06 NOTE — Consult Note (Addendum)
Cardiology Consultation:   [name]; [id number]; 1918/03/18   Admit date: 09/06/2018 Date of Consult: 09/06/2018  Primary Care Provider: Leone Haven, MD Primary Cardiologist: new to [facility] - consult by Rockey Situ   Patient Profile:   Paul Cross is a 85 y.o. male with a hx of ILD, hypertension, hyperlipidemia, pulmonary hypertension, reported COPD versus interstitial lung disease, prior tobacco abuse and hypothyroidism who is being seen today for the evaluation of high-grade AV block with syncope at the request of Dr. Cinda Quest.  History of Present Illness:   [name] has no previously known cardiac history.  He was admitted to the hospital in 12/2017 for shortness of breath, weakness, and hypoxia.  Review of EKGs shows left bundle in the ER with follow-up EKG showing right bundle with first-degree AV block.  He was seen by outside cardiology group who recommended echocardiogram which showed EF 60 to 65%, mild to moderate LVH, grade 1 diastolic dysfunction, mildly dilated RV with normal RV systolic function, PASP 35 to 40 mmHg.  He has not followed up with cardiology since.  Patient was in his usual state of health up until 3 days ago when he developed sudden onset dizziness and generalized malaise/fatigue.  He reported to his daughter he just "did not feel well."  He was unable to elaborate any further on this to her or myself in the ED today.  On 09/05/2018 he was walking in his house and suffered a syncopal episode hitting his shoulder on the door jam.  He did not hit his head.  There was no loss of bowel or bladder function.  No seizure-like activity.  He was able to get himself back up on his feet and notified his daughter.  In this setting, he was brought to the ED this morning where he was noted to be in high-grade AV block.  Review of medications indicates he is not on any rate limiting therapies.  Labs showed a potassium of 5.0, TSH pending, troponin  0.03, Hgb 12.6, PLT 144, serum creatinine 1.61 with a baseline of 1-1.2.  In the ER, he was briefly transcutaneous pacer though did not tolerate this.  Initial BP was 90/50 which has subsequently improved to the 140s over 80s.  Given this, transcutaneous pacing was discontinued.  Case has been reviewed by interventional cardiology and electrophysiology at [facility] without the recommendation to proceed with venous temp wire at this time.  It has been recommended the patient be transferred to [facility] for [facility] implantation later today.  Patient's last meal was a light breakfast around 8 AM this morning.  Imaging nonacute as below.  Patient had received 2 doses of atropine with current heart rate in the 20s bpm.    Past Medical History:  Diagnosis Date  . Hyperlipidemia   . Hypertension   . Hypothyroidism    approximately 2009    Past Surgical History:  Procedure Laterality Date  . CHOLECYSTECTOMY N/A 04/16/2015   Procedure: LAPAROSCOPIC CHOLECYSTECTOMY;  Surgeon: Clayburn Pert, MD;  Location: [facility] ORS;  Service: General;  Laterality: N/A;  . FEMORAL ARTERY REPAIR       Home Meds: Prior to Admission medications   Medication Sig Start Date End Date Taking? Authorizing Provider  acetaminophen (TYLENOL) 325 MG tablet Take 975 mg by mouth 3 (three) times daily as needed for mild pain, moderate pain, fever or headache.   Yes [provider]  amLODipine (NORVASC) 10 MG tablet Take 5  mg by mouth daily.    Yes [provider]  aspirin EC 81 MG tablet Take 81 mg by mouth daily.   Yes [provider]  budesonide-formoterol (SYMBICORT) 160-4.5 MCG/ACT inhaler Inhale 2 puffs into the lungs 2 (two) times daily.   Yes [provider]  cholecalciferol (VITAMIN D) 1000 units tablet Take 1,000 Units by mouth daily.   Yes [provider]  hydrocortisone cream 1 % Apply 1 application topically 2 (two) times daily. FOR TAILBONE   Yes [provider]   levothyroxine (SYNTHROID, LEVOTHROID) 137 MCG tablet Take 137 mcg by mouth daily before breakfast.    Yes [provider]  Multiple Vitamin (MULTIVITAMIN) capsule Take 1 capsule by mouth daily.   Yes [provider]  tamsulosin (FLOMAX) 0.4 MG CAPS capsule Take 2 capsules (0.8 mg total) by mouth daily. Patient taking differently: Take 0.4 mg by mouth at bedtime.  07/11/18  Yes Leone Haven, MD  tiotropium (SPIRIVA HANDIHALER) 18 MCG inhalation capsule Place 1 capsule (18 mcg total) into inhaler and inhale daily. 02/01/18 02/01/19 Yes Young, Clinton D, MD  Tiotropium Bromide Monohydrate (SPIRIVA RESPIMAT) 2.5 MCG/ACT AERS Inhale 2 puffs into the lungs daily. 03/28/18  Yes Juanito Doom, MD  vitamin B-12 (CYANOCOBALAMIN) 1000 MCG tablet Take 1,000 mcg by mouth daily.   Yes [provider]    Inpatient Medications: Scheduled Meds:  Continuous Infusions:  PRN Meds:   Allergies:   Allergies  Allergen Reactions  . Diphenhydramine Hcl Other (See Comments)    Reaction:  Unknown   . Simvastatin Other (See Comments)    Reaction:  Unknown     Social History:   Social History   Socioeconomic History  . Marital status: Widowed    Spouse name: Not on file  . Number of children: Not on file  . Years of education: Not on file  . Highest education level: Not on file  Occupational History  . Not on file  Social Needs  . Financial resource strain: Not on file  . Food insecurity:    Worry: Not on file    Inability: Not on file  . Transportation needs:    Medical: Not on file    Non-medical: Not on file  Tobacco Use  . Smoking status: Former Research scientist (life sciences)  . Smokeless tobacco: Former Network engineer and Sexual Activity  . Alcohol use: No  . Drug use: No  . Sexual activity: Not on file  Lifestyle  . Physical activity:    Days per week: Not on file    Minutes per session: Not on file  . Stress: Not on file  Relationships  . Social connections:    Talks  on phone: Not on file    Gets together: Not on file    Attends religious service: Not on file    Active member of club or organization: Not on file    Attends meetings of clubs or organizations: Not on file    Relationship status: Not on file  . Intimate partner violence:    Fear of current or ex partner: Not on file    Emotionally abused: Not on file    Physically abused: Not on file    Forced sexual activity: Not on file  Other Topics Concern  . Not on file  Social History Narrative  . Not on file     Family History:   Family History  Problem Relation Age of Onset  . Cancer Sister  breast  . Diabetes Brother   . Heart disease Brother   . Stroke Sister   . Depression Daughter     ROS:  Review of Systems  Constitutional: Positive for malaise/fatigue. Negative for chills, diaphoresis, fever and weight loss.  HENT: Negative for congestion.   Eyes: Negative for discharge and redness.  Respiratory: Negative for cough, hemoptysis, sputum production, shortness of breath and wheezing.   Cardiovascular: Negative for chest pain, palpitations, orthopnea, claudication, leg swelling and PND.  Gastrointestinal: Negative for abdominal pain, blood in stool, heartburn, melena, nausea and vomiting.  Genitourinary: Negative for hematuria.  Musculoskeletal: Negative for falls and myalgias.  Skin: Negative for rash.  Neurological: Positive for dizziness, loss of consciousness and weakness. Negative for tingling, tremors, sensory change, speech change, focal weakness, seizures and headaches.  Endo/Heme/Allergies: Does not bruise/bleed easily.  Psychiatric/Behavioral: Negative for substance abuse. The patient is not nervous/anxious.   All other systems reviewed and are negative.     Physical Exam/Data:   Vitals:   09/06/18 1108 09/06/18 1115 09/06/18 1147 09/06/18 1200  BP: 140/88  130/70 (!) 158/86  Pulse: (!) 29 (!) 28  (!) 29  Resp: (!) 22 13  19   SpO2: 100% 99%  97%  Weight:       Height:       No intake or output data in the 24 hours ending 09/06/18 1212 Filed Weights   09/06/18 1008  Weight: 86.2 kg   Body mass index is 25.77 kg/m.   Physical Exam: General: Elderly appearing, well developed, well nourished, in no acute distress. Head: Normocephalic, atraumatic, sclera non-icteric, no xanthomas, nares without discharge.  Hard of hearing.  Neck: Negative for carotid bruits. JVD not elevated. Lungs: Clear bilaterally to auscultation without wheezes, rales, or rhonchi. Breathing is unlabored. Heart: Markedly bradycardic with S1 S2. No murmurs, rubs, or gallops appreciated. Abdomen: Soft, non-tender, non-distended with normoactive bowel sounds. No hepatomegaly. No rebound/guarding. No obvious abdominal masses. Msk:  Strength and tone appear normal for age. Extremities: No clubbing or cyanosis.  Trace bilateral pretibial edema edema. Distal pedal pulses are 2+ and equal bilaterally. Neuro: Alert and oriented X 3. No facial asymmetry. No focal deficit. Moves all extremities spontaneously. Psych:  Responds to questions appropriately with a normal affect.   EKG:  The EKG was personally reviewed and demonstrates: High-grade AV block, 27 bpm, RBBB Telemetry:  Telemetry was personally reviewed and demonstrates: High-grade AV block, 20s bpm  Weights: Filed Weights   09/06/18 1008  Weight: 86.2 kg    Relevant CV Studies: 2D echo 01/02/2018: Study Conclusions  - Procedure narrative: Transthoracic echocardiography. Image   quality was suboptimal. - Left ventricle: The cavity size was normal. Wall thickness was   increased increased in a pattern of mild to moderate LVH.   Systolic function was normal. The estimated ejection fraction was   in the range of 60% to 65%. Doppler parameters are consistent   with abnormal left ventricular relaxation (grade 1 diastolic   dysfunction). - Right ventricle: The cavity size was mildly dilated. Systolic   function was  normal. - Pulmonary arteries: Systolic pressure was mildly to moderately   increased, in the range of 35 mm Hg to 40 mm Hg plus central   venous/right atrial pressure.   Laboratory Data:  Chemistry Recent Labs  Lab 09/06/18 1011  NA 136  K 5.0  CL 104  CO2 24  GLUCOSE 121*  BUN 47*  CREATININE 1.61*  CALCIUM 9.5  GFRNONAA 35*  GFRAA 41*  ANIONGAP 8    Recent Labs  Lab 09/06/18 1011  PROT 6.8  ALBUMIN 3.9  AST 18  ALT 16  ALKPHOS 35*  BILITOT 0.9   Hematology Recent Labs  Lab 09/06/18 1011  WBC 7.2  RBC 3.82*  HGB 12.6*  HCT 38.3*  MCV 100.3*  MCH 33.0  MCHC 32.9  RDW 13.2  PLT 144*   Cardiac Enzymes Recent Labs  Lab 09/06/18 1011  TROPONINI 0.03*   No results for input(s): TROPIPOC in the last 168 hours.  BNPNo results for input(s): BNP, PROBNP in the last 168 hours.  DDimer No results for input(s): DDIMER in the last 168 hours.  Radiology/Studies:  Ct Head Wo Contrast  Result Date: 09/06/2018 IMPRESSION: No acute abnormality head or cervical spine. Atrophy and chronic microvascular ischemic change. Atherosclerosis. Cervical spondylosis. Electronically Signed   By: Inge Rise M.D.   On: 09/06/2018 11:31   Ct Cervical Spine Wo Contrast  Result Date: 09/06/2018 IMPRESSION: No acute abnormality head or cervical spine. Atrophy and chronic microvascular ischemic change. Atherosclerosis. Cervical spondylosis. Electronically Signed   By: Inge Rise M.D.   On: 09/06/2018 11:31   Dg Chest Portable 1 View  Result Date: 09/06/2018 IMPRESSION: No acute abnormality. Chronic interstitial lung disease. Atherosclerosis. Electronically Signed   By: Inge Rise M.D.   On: 09/06/2018 10:42    Assessment and Plan:   1.  High-grade AV block with syncope: -Did not tolerate transcutaneous pacing -BP stable -After review with interventional cardiology and EP it has been recommended to not proceed with any stent prior -Pacer pads in place -Status  post 2 doses of atropine -Emergent transport to [facility] for evaluation by EP for pacemaker -TSH pending -Not on any rate limiting medications -Potassium 5.0 -Obtain echo -N.p.o.  2.  AKI: -Likely ATN secondary to dehydration -Gentle IV fluids following PPM implantation  3.  Hypertension: -Hold medications  4.  Hyperlipidemia: -Continue statin  5. ILD: -Outpatient follow up   For questions or updates, please contact [place] Please consult [email or web address] for contact info under Cardiology/STEMI.   Signed, Christell Faith, PA-C [facility] Pager: [phone] 09/06/2018, 12:12 PM

## 2018-09-06 NOTE — ED Provider Notes (Addendum)
[facility] Emergency Department Provider Note   ____________________________________________   First MD Initiated Contact with Patient 09/06/18 1010     (approximate)  I have reviewed the triage vital signs and the nursing notes.   HISTORY  Chief Complaint Dizziness    HPI Paul Cross is a 85 y.o. male patient reports lightheadedness and weakness with exertion since Monday 4 days ago.  He denies any chest pain tightness shortness of breath more than his usual baseline from his chronic lung disease.  He did fall a day or 2 ago.  Does not remember when not sure if he hit his head.  He does complain of a headache that is focal in his right frontal parietal area         Past Medical History:  Diagnosis Date  . Hyperlipidemia   . Hypertension   . Hypothyroidism    approximately 2009    Patient Active Problem List   Diagnosis Date Noted  . BPH (benign prostatic hyperplasia) 07/11/2018  . Pressure injury of skin of left buttock 07/11/2018  . Elevated glucose 02/08/2018  . Proteinuria 02/08/2018  . Anxiety and depression 02/08/2018  . Pulmonary fibrosis ([place]) 02/08/2018  . Hypertension 02/08/2018  . Hypothyroidism 02/08/2018  . Shingles 02/08/2018  . Adrenal adenoma, right 02/08/2018  . Abdominal aortic aneurysm (AAA) 3.0 cm to 5.5 cm in diameter in male ([place]) 02/08/2018  . Respiratory failure ([place]) 01/01/2018  . Aftercare following surgery 05/04/2015    Past Surgical History:  Procedure Laterality Date  . CHOLECYSTECTOMY N/A 04/16/2015   Procedure: LAPAROSCOPIC CHOLECYSTECTOMY;  Surgeon: Clayburn Pert, MD;  Location: [facility] ORS;  Service: General;  Laterality: N/A;  . FEMORAL ARTERY REPAIR      Prior to Admission medications   Medication Sig Start Date End Date Taking? Authorizing Provider  acetaminophen (TYLENOL) 325 MG tablet Take 975 mg by mouth 3 (three) times daily as needed for mild pain, moderate pain, fever or headache.     [provider]  amLODipine (NORVASC) 5 MG tablet Take 5 mg by mouth daily.    [provider]  aspirin EC 81 MG tablet Take 81 mg by mouth daily.    [provider]  atorvastatin (LIPITOR) 40 MG tablet Take 20 mg by mouth at bedtime.    [provider]  budesonide-formoterol (SYMBICORT) 160-4.5 MCG/ACT inhaler Inhale 2 puffs into the lungs 2 (two) times daily.    [provider]  cholecalciferol (VITAMIN D) 1000 units tablet Take 1,000 Units by mouth daily.    [provider]  hydrocortisone cream 1 % Apply 1 application topically 2 (two) times daily. FOR TAILBONE    [provider]  levothyroxine (SYNTHROID, LEVOTHROID) 137 MCG tablet Take 137 mcg by mouth daily before breakfast.     [provider]  Multiple Vitamin (MULTIVITAMIN) capsule Take 1 capsule by mouth daily.    [provider]  tamsulosin (FLOMAX) 0.4 MG CAPS capsule Take 2 capsules (0.8 mg total) by mouth daily. Patient taking differently: Take 0.4 mg by mouth daily.  07/11/18   Leone Haven, MD  tiotropium (SPIRIVA HANDIHALER) 18 MCG inhalation capsule Place 1 capsule (18 mcg total) into inhaler and inhale daily. 02/01/18 02/01/19  Baird Lyons D, MD  Tiotropium Bromide Monohydrate (SPIRIVA RESPIMAT) 2.5 MCG/ACT AERS Inhale 2 puffs into the lungs daily. 03/28/18   Juanito Doom, MD  vitamin B-12 (CYANOCOBALAMIN) 1000 MCG tablet Take 1,000 mcg by mouth daily.  [provider]    Allergies Diphenhydramine hcl and Simvastatin  Family History  Problem Relation Age of Onset  . Cancer Sister        breast  . Diabetes Brother   . Heart disease Brother   . Stroke Sister   . Depression Daughter     Social History Social History   Tobacco Use  . Smoking status: Former Research scientist (life sciences)  . Smokeless tobacco: Former Network engineer Use Topics  . Alcohol use: No  . Drug use: No    Review of Systems  Constitutional: No fever/chills  Eyes: No visual changes. ENT: No sore throat. Cardiovascular: Denies chest pain. Respiratory: Denies shortness of breath more than his baseline. Gastrointestinal: No abdominal pain.  No nausea, no vomiting.  No diarrhea.  No constipation. Genitourinary: Negative for dysuria. Musculoskeletal: Negative for back pain. Skin: Negative for rash. Neurological: Negative for headaches, focal weakness ____________________________________________   PHYSICAL EXAM:  VITAL SIGNS: ED Triage Vitals  Enc Vitals Group     BP --      Pulse Rate 09/06/18 1007 (!) 27     Resp 09/06/18 1007 19     Temp --      Temp src --      SpO2 09/06/18 1007 98 %     Weight 09/06/18 1008 190 lb (86.2 kg)     Height 09/06/18 1008 6' (1.829 m)     Head Circumference --      Peak Flow --      Pain Score 09/06/18 1008 0     Pain Loc --      Pain Edu? --      Excl. in [place]? --    Constitutional: Alert and oriented. Well appearing and in no acute distress. Eyes: Conjunctivae are normal.  Head: Atraumatic. Nose: No congestion/rhinnorhea. Mouth/Throat: Mucous membranes are moist.  Oropharynx non-erythematous. Neck: No stridor.   Cardiovascular: Slow rate, regular rhythm. Grossly normal heart sounds.  Good peripheral circulation. Respiratory: Normal respiratory effort.  No retractions. Lungs CTAB. Gastrointestinal: Soft and nontender. No distention. No abdominal bruits. No CVA tenderness. Musculoskeletal: No lower extremity tenderness trace bilateral edema.  Neurologic:  Normal speech and language. No gross focal neurologic deficits are appreciated.  Skin:  Skin is warm, dry and intact. No rash noted. Psychiatric: Mood and affect are normal. Speech and behavior are normal.  ____________________________________________   LABS (all labs ordered are listed, but only abnormal results are displayed)  Labs Reviewed  TROPONIN I - Abnormal; Notable for the following components:      Result Value   Troponin I 0.03  (*)    All other components within normal limits  COMPREHENSIVE METABOLIC PANEL - Abnormal; Notable for the following components:   Glucose, Bld 121 (*)    BUN 47 (*)    Creatinine, Ser 1.61 (*)    Alkaline Phosphatase 35 (*)    GFR calc non Af Amer 35 (*)    GFR calc Af Amer 41 (*)    All other components within normal limits  CBC WITH DIFFERENTIAL/PLATELET - Abnormal; Notable for the following components:   RBC 3.82 (*)    Hemoglobin 12.6 (*)    HCT 38.3 (*)    MCV 100.3 (*)    Platelets 144 (*)    All other components within normal limits   ____________________________________________  EKG  EKG read interpreted by me shows sinus bradycardia at a rate of 27 with first and second-degree AV block second-degree type II.  He appears to be dropping to QRSs out of 3.  There are no acute ST-T changes ____________________________________________  RADIOLOGY  ED MD interpretation: CT head neck read by radiology is negative for acute disease rest x-ray read by radiology reviewed by me is also negative for acute disease  Official radiology report(s): Ct Head Wo Contrast  Result Date: 09/06/2018 CLINICAL DATA:  Dizziness today and yesterday. Bradycardia today. Status post fall yesterday. Initial encounter. EXAM: CT HEAD WITHOUT CONTRAST CT CERVICAL SPINE WITHOUT CONTRAST TECHNIQUE: Multidetector CT imaging of the head and cervical spine was performed following the standard protocol without intravenous contrast. Multiplanar CT image reconstructions of the cervical spine were also generated. COMPARISON:  Brain MRI and head CT 04/18/2017. FINDINGS: CT HEAD FINDINGS Brain: No evidence of acute infarction, hemorrhage, hydrocephalus, extra-axial collection or mass lesion/mass effect. Atrophy and chronic microvascular ischemic change are noted. Vascular: Atherosclerosis is identified. Skull: Intact.  No focal lesion. Sinuses/Orbits: Negative. Other: None. CT CERVICAL SPINE FINDINGS Alignment: There is  reversal of the normal cervical lordosis. Mild facet mediated anterolisthesis C3 on C4 and C4 on C5 noted. Skull base and vertebrae: No acute fracture. No primary bone lesion or focal pathologic process. Soft tissues and spinal canal: No prevertebral fluid or swelling. No visible canal hematoma. Disc levels: Loss of disc space height is worst at C5-6, C6-7 and C7-T1. Scattered facet arthropathy noted. Upper chest: Apical scar is present. Other: Atherosclerosis noted. IMPRESSION: No acute abnormality head or cervical spine. Atrophy and chronic microvascular ischemic change. Atherosclerosis. Cervical spondylosis. Electronically Signed   By: Inge Rise M.D.   On: 09/06/2018 11:31   Ct Cervical Spine Wo Contrast  Result Date: 09/06/2018 CLINICAL DATA:  Dizziness today and yesterday. Bradycardia today. Status post fall yesterday. Initial encounter. EXAM: CT HEAD WITHOUT CONTRAST CT CERVICAL SPINE WITHOUT CONTRAST TECHNIQUE: Multidetector CT imaging of the head and cervical spine was performed following the standard protocol without intravenous contrast. Multiplanar CT image reconstructions of the cervical spine were also generated. COMPARISON:  Brain MRI and head CT 04/18/2017. FINDINGS: CT HEAD FINDINGS Brain: No evidence of acute infarction, hemorrhage, hydrocephalus, extra-axial collection or mass lesion/mass effect. Atrophy and chronic microvascular ischemic change are noted. Vascular: Atherosclerosis is identified. Skull: Intact.  No focal lesion. Sinuses/Orbits: Negative. Other: None. CT CERVICAL SPINE FINDINGS Alignment: There is reversal of the normal cervical lordosis. Mild facet mediated anterolisthesis C3 on C4 and C4 on C5 noted. Skull base and vertebrae: No acute fracture. No primary bone lesion or focal pathologic process. Soft tissues and spinal canal: No prevertebral fluid or swelling. No visible canal hematoma. Disc levels: Loss of disc space height is worst at C5-6, C6-7 and C7-T1. Scattered  facet arthropathy noted. Upper chest: Apical scar is present. Other: Atherosclerosis noted. IMPRESSION: No acute abnormality head or cervical spine. Atrophy and chronic microvascular ischemic change. Atherosclerosis. Cervical spondylosis. Electronically Signed   By: Inge Rise M.D.   On: 09/06/2018 11:31   Dg Chest Portable 1 View  Result Date: 09/06/2018 CLINICAL DATA:  Onset dizziness yesterday.  Status post fall. EXAM: PORTABLE CHEST 1 VIEW COMPARISON:  CT chest 03/20/2018. Single-view of the chest 01/15/2018 and 01/01/2018. FINDINGS: Changes of chronic interstitial lung disease are again seen. No superimposed pneumonia or pulmonary edema. Heart size is upper normal. Atherosclerosis noted. No pneumothorax or pleural effusion. Defibrillator pads are in place. No acute bony abnormality. IMPRESSION: No acute abnormality. Chronic interstitial lung disease. Atherosclerosis. Electronically Signed   By: Inge Rise M.D.   On:  09/06/2018 10:42    ____________________________________________   PROCEDURES  Procedure(s) performed (including Critical Care):  Procedures   ____________________________________________   INITIAL IMPRESSION / ASSESSMENT AND PLAN / ED COURSE Patient's blood pressure at 2751 is 700 systolic with a manual cuff.  I discussed the patient with Dr. Fletcher Anon he is going to come in and see the patient.  [facility] sees the patient and arrange his transfer.  I am doing the [place] for them.  As I understand it Dr. Curt Bears accepted the patient             ____________________________________________   FINAL CLINICAL IMPRESSION(S) / ED DIAGNOSES  Final diagnoses:  Symptomatic bradycardia     ED Discharge Orders    None       Note:  This document was prepared using Dragon voice recognition software and may include unintentional dictation errors.    Nena Polio, MD 09/06/18 1138    Nena Polio, MD 09/06/18 1254     Nena Polio, MD 09/06/18 [phone]

## 2018-09-06 NOTE — ED Notes (Signed)
Cardiology at bedside.

## 2018-09-06 NOTE — H&P (Signed)
Paul Cross has presented today for surgery, with the diagnosis of second degree AV block.  The various methods of treatment have been discussed with the patient and family. After consideration of risks, benefits and other options for treatment, the patient has consented to  Procedure(s): Pacemaker implant as a surgical intervention .  Risks include but not limited to bleeding, tamponade, infection, pneumothorax, among others. The patient's history has been reviewed, patient examined, no change in status, stable for surgery.  I have reviewed the patient's chart and labs.  Questions were answered to the patient's satisfaction.    Paul Emanuele Curt Bears, MD 09/06/2018 3:35 PM

## 2018-09-06 NOTE — Progress Notes (Signed)
Received pt from [facility] alert and oriented , with no complaint of discomfort at this time.  SL in right and left AC ,bedside monitor on, pacing pads on and attached to Paul Cross. Dr Curt Bears in to see pt, consent signed.  Pt to procedure area via stretcher.

## 2018-09-06 NOTE — ED Triage Notes (Signed)
Pt c/o dizziness since yesterday. Fall yesterday from dizziness.  Brady rhythm on arrival.  Placed on pads and EKG as soon as arrived.

## 2018-09-06 NOTE — ED Notes (Signed)
X-ray at bedside

## 2018-09-06 NOTE — Telephone Encounter (Signed)
Daughter is calling with concerns about father- he had fall yesterday- unsteady on his feet- some dizziness. She has had him push fluids but his BP is still low- advised ED due to symptoms.  Reason for Disposition . [8] Fall in systolic BP > 20 mm Hg from normal AND [2] dizzy, lightheaded, or weak  Answer Assessment - Initial Assessment Questions 1. BLOOD PRESSURE: "What is the blood pressure?" "Did you take at least two measurements 5 minutes apart?"     132/40, 126/45 2. ONSET: "When did you take your blood pressure?"     8:30 this morning 3. HOW: "How did you obtain the blood pressure?" (e.g., visiting nurse, automatic home BP monitor)     Automatic cuff 4. HISTORY: "Do you have a history of low blood pressure?" "What is your blood pressure normally?"     Usually normal- 140/80 5. MEDICATIONS: "Are you taking any medications for blood pressure?" If yes: "Have they been changed recently?"     Was taking small dose- did stop after fall yesterday- today no dose 6. PULSE RATE: "Do you know what your pulse rate is?"      32- this morning 7. OTHER SYMPTOMS: "Have you been sick recently?" "Have you had a recent injury?"     no 8. PREGNANCY: "Is there any chance you are pregnant?" "When was your last menstrual period?"     n/a  Protocols used: LOW BLOOD PRESSURE-A-AH

## 2018-09-06 NOTE — Plan of Care (Signed)

## 2018-09-06 NOTE — H&P (Signed)
Cardiology Admission History and Physical:   [name] MRN: [id number]; DOB: [birth year]   Admission date: 09/06/2018  Primary Care Provider: Leone Haven, MD Primary Cardiologist: No primary care provider on file.  Primary Electrophysiologist:  None   Chief Complaint:  Weakness, fall  Patient Profile:   Paul Cross is a 85 y.o. male with history of ILD, hypertension, hyperlipidemia, and pulmonary hypertension, and reported COPD versus interstitial lung disease who presented to the hospital today with high-grade AV block.  He was transferred to [facility] for pacemaker insertion.  Referred by Dr. Rockey Situ.  History of Present Illness:   Paul Cross patient was in his usual state of health until 4 days ago when he developed dizziness, malaise, and weakness.  He was walking from the kitchen to the den when he fell and hit his shoulder on the door.  He did lose consciousness.  He was able to get back on his feet notify his family.  He was brought to the emergency room via EMS who found heart rates in the 20s.  EKG shows 3-1 AV block.  He is currently not on any medications at this time to induce bradycardia.   Past Medical History:  Diagnosis Date   Hyperlipidemia    Hypertension    Hypothyroidism    approximately 2009    Past Surgical History:  Procedure Laterality Date   CHOLECYSTECTOMY N/A 04/16/2015   Procedure: LAPAROSCOPIC CHOLECYSTECTOMY;  Surgeon: Clayburn Pert, MD;  Location: [facility] ORS;  Service: General;  Laterality: N/A;   FEMORAL ARTERY REPAIR       Medications Prior to Admission: Prior to Admission medications   Medication Sig Start Date End Date Taking? Authorizing Provider  acetaminophen (TYLENOL) 325 MG tablet Take 975 mg by mouth 3 (three) times daily as needed for mild pain, moderate pain, fever or headache.    [provider]  amLODipine (NORVASC) 10 MG tablet Take 5 mg by mouth daily.     [provider]  aspirin EC 81 MG tablet Take 81 mg by mouth daily.    [provider]  budesonide-formoterol (SYMBICORT) 160-4.5 MCG/ACT inhaler Inhale 2 puffs into the lungs 2 (two) times daily.    [provider]  cholecalciferol (VITAMIN D) 1000 units tablet Take 1,000 Units by mouth daily.    [provider]  hydrocortisone cream 1 % Apply 1 application topically 2 (two) times daily. FOR TAILBONE    [provider]  levothyroxine (SYNTHROID, LEVOTHROID) 137 MCG tablet Take 137 mcg by mouth daily before breakfast.     [provider]  Multiple Vitamin (MULTIVITAMIN) capsule Take 1 capsule by mouth daily.    [provider]  tamsulosin (FLOMAX) 0.4 MG CAPS capsule Take 2 capsules (0.8 mg total) by mouth daily. Patient taking differently: Take 0.4 mg by mouth at bedtime.  07/11/18   Leone Haven, MD  tiotropium (SPIRIVA HANDIHALER) 18 MCG inhalation capsule Place 1 capsule (18 mcg total) into inhaler and inhale daily. 02/01/18 02/01/19  Baird Lyons D, MD  Tiotropium Bromide Monohydrate (SPIRIVA RESPIMAT) 2.5 MCG/ACT AERS Inhale 2 puffs into the lungs daily. 03/28/18   Juanito Doom, MD  vitamin B-12 (CYANOCOBALAMIN) 1000 MCG tablet Take 1,000 mcg by mouth daily.    [provider]     Allergies:    Allergies  Allergen Reactions   Diphenhydramine Hcl Other (See Comments)    Reaction:  Unknown    Simvastatin Other (See Comments)  Reaction:  Unknown     Social History:   Social History   Socioeconomic History   Marital status: Widowed    Spouse name: Not on file   Number of children: Not on file   Years of education: Not on file   Highest education level: Not on file  Occupational History   Not on file  Social Needs   Financial resource strain: Not on file   Food insecurity:    Worry: Not on file    Inability: Not on file   Transportation needs:    Medical: Not on file    Non-medical: Not on  file  Tobacco Use   Smoking status: Former Smoker   Smokeless tobacco: Former Systems developer  Substance and Sexual Activity   Alcohol use: No   Drug use: No   Sexual activity: Not on file  Lifestyle   Physical activity:    Days per week: Not on file    Minutes per session: Not on file   Stress: Not on file  Relationships   Social connections:    Talks on phone: Not on file    Gets together: Not on file    Attends religious service: Not on file    Active member of club or organization: Not on file    Attends meetings of clubs or organizations: Not on file    Relationship status: Not on file   Intimate partner violence:    Fear of current or ex partner: Not on file    Emotionally abused: Not on file    Physically abused: Not on file    Forced sexual activity: Not on file  Other Topics Concern   Not on file  Social History Narrative   Not on file    Family History:   The patient's family history includes Cancer in his sister; Depression in his daughter; Diabetes in his brother; Heart disease in his brother; Stroke in his sister.    ROS:  Please see the history of present illness.  All other ROS reviewed and negative.     Physical Exam/Data:  There were no vitals filed for this visit. No intake or output data in the 24 hours ending 09/06/18 1505 Last 3 Weights 09/06/2018 08/15/2018 07/11/2018  Weight (lbs) 190 lb 193 lb 9.6 oz 193 lb 3.2 oz  Weight (kg) 86.183 kg 87.816 kg 87.635 kg     There is no height or weight on file to calculate BMI.  General:  Well nourished, well developed, in no acute distress HEENT: normal Lymph: no adenopathy Neck: no JVD Endocrine:  No thryomegaly Vascular: No carotid bruits; FA pulses 2+ bilaterally without bruits  Cardiac: Bradycardic, regular, no murmurs Lungs:  clear to auscultation bilaterally, no wheezing, rhonchi or rales  Abd: soft, nontender, no hepatomegaly  Ext: no edema Musculoskeletal:  No deformities, BUE and BLE strength  normal and equal Skin: warm and dry  Neuro:  CNs 2-12 intact, no focal abnormalities noted Psych:  Normal affect    EKG:  The ECG that was done was personally reviewed and demonstrates high-grade second-degree AV block  Relevant CV Studies: TTE 12/2017 - Left ventricle: The cavity size was normal. Wall thickness was   increased increased in a pattern of mild to moderate LVH.   Systolic function was normal. The estimated ejection fraction was   in the range of 60% to 65%. Doppler parameters are consistent   with abnormal left ventricular relaxation (grade 1 diastolic   dysfunction). - Right  ventricle: The cavity size was mildly dilated. Systolic   function was normal. - Pulmonary arteries: Systolic pressure was mildly to moderately   increased, in the range of 35 mm Hg to 40 mm Hg plus central   venous/right atrial pressure.  Laboratory Data:  Chemistry Recent Labs  Lab 09/06/18 1011  NA 136  K 5.0  CL 104  CO2 24  GLUCOSE 121*  BUN 47*  CREATININE 1.61*  CALCIUM 9.5  GFRNONAA 35*  GFRAA 41*  ANIONGAP 8    Recent Labs  Lab 09/06/18 1011  PROT 6.8  ALBUMIN 3.9  AST 18  ALT 16  ALKPHOS 35*  BILITOT 0.9   Hematology Recent Labs  Lab 09/06/18 1011  WBC 7.2  RBC 3.82*  HGB 12.6*  HCT 38.3*  MCV 100.3*  MCH 33.0  MCHC 32.9  RDW 13.2  PLT 144*   Cardiac Enzymes Recent Labs  Lab 09/06/18 1011  TROPONINI 0.03*   No results for input(s): TROPIPOC in the last 168 hours.  BNPNo results for input(s): BNP, PROBNP in the last 168 hours.  DDimer No results for input(s): DDIMER in the last 168 hours.  Radiology/Studies:  Ct Head Wo Contrast  Result Date: 09/06/2018 CLINICAL DATA:  Dizziness today and yesterday. Bradycardia today. Status post fall yesterday. Initial encounter. EXAM: CT HEAD WITHOUT CONTRAST CT CERVICAL SPINE WITHOUT CONTRAST TECHNIQUE: Multidetector CT imaging of the head and cervical spine was performed following the standard protocol without  intravenous contrast. Multiplanar CT image reconstructions of the cervical spine were also generated. COMPARISON:  Brain MRI and head CT 04/18/2017. FINDINGS: CT HEAD FINDINGS Brain: No evidence of acute infarction, hemorrhage, hydrocephalus, extra-axial collection or mass lesion/mass effect. Atrophy and chronic microvascular ischemic change are noted. Vascular: Atherosclerosis is identified. Skull: Intact.  No focal lesion. Sinuses/Orbits: Negative. Other: None. CT CERVICAL SPINE FINDINGS Alignment: There is reversal of the normal cervical lordosis. Mild facet mediated anterolisthesis C3 on C4 and C4 on C5 noted. Skull base and vertebrae: No acute fracture. No primary bone lesion or focal pathologic process. Soft tissues and spinal canal: No prevertebral fluid or swelling. No visible canal hematoma. Disc levels: Loss of disc space height is worst at C5-6, C6-7 and C7-T1. Scattered facet arthropathy noted. Upper chest: Apical scar is present. Other: Atherosclerosis noted. IMPRESSION: No acute abnormality head or cervical spine. Atrophy and chronic microvascular ischemic change. Atherosclerosis. Cervical spondylosis. Electronically Signed   By: Inge Rise M.D.   On: 09/06/2018 11:31   Ct Cervical Spine Wo Contrast  Result Date: 09/06/2018 CLINICAL DATA:  Dizziness today and yesterday. Bradycardia today. Status post fall yesterday. Initial encounter. EXAM: CT HEAD WITHOUT CONTRAST CT CERVICAL SPINE WITHOUT CONTRAST TECHNIQUE: Multidetector CT imaging of the head and cervical spine was performed following the standard protocol without intravenous contrast. Multiplanar CT image reconstructions of the cervical spine were also generated. COMPARISON:  Brain MRI and head CT 04/18/2017. FINDINGS: CT HEAD FINDINGS Brain: No evidence of acute infarction, hemorrhage, hydrocephalus, extra-axial collection or mass lesion/mass effect. Atrophy and chronic microvascular ischemic change are noted. Vascular: Atherosclerosis  is identified. Skull: Intact.  No focal lesion. Sinuses/Orbits: Negative. Other: None. CT CERVICAL SPINE FINDINGS Alignment: There is reversal of the normal cervical lordosis. Mild facet mediated anterolisthesis C3 on C4 and C4 on C5 noted. Skull base and vertebrae: No acute fracture. No primary bone lesion or focal pathologic process. Soft tissues and spinal canal: No prevertebral fluid or swelling. No visible canal hematoma. Disc levels: Loss of disc  space height is worst at C5-6, C6-7 and C7-T1. Scattered facet arthropathy noted. Upper chest: Apical scar is present. Other: Atherosclerosis noted. IMPRESSION: No acute abnormality head or cervical spine. Atrophy and chronic microvascular ischemic change. Atherosclerosis. Cervical spondylosis. Electronically Signed   By: Inge Rise M.D.   On: 09/06/2018 11:31   Dg Chest Portable 1 View  Result Date: 09/06/2018 CLINICAL DATA:  Onset dizziness yesterday.  Status post fall. EXAM: PORTABLE CHEST 1 VIEW COMPARISON:  CT chest 03/20/2018. Single-view of the chest 01/15/2018 and 01/01/2018. FINDINGS: Changes of chronic interstitial lung disease are again seen. No superimposed pneumonia or pulmonary edema. Heart size is upper normal. Atherosclerosis noted. No pneumothorax or pleural effusion. Defibrillator pads are in place. No acute bony abnormality. IMPRESSION: No acute abnormality. Chronic interstitial lung disease. Atherosclerosis. Electronically Signed   By: Inge Rise M.D.   On: 09/06/2018 10:42    Assessment and Plan:   1. Second-degree AV block with 3-1 conduction: Has been having symptoms of dizziness with an episode of syncope yesterday.  Currently not on any rate controlling medications.  Due to that, we Paul Cross plan for pacemaker implant.  Risks and benefits were discussed include bleeding, infection, tamponade, pneumothorax.  The patient understands the risks and is agreed to the procedure. 2. Acute renal failure: Likely due to low cardiac  output from heart block.  This Paul Cross likely improve after pacemaker implantation. 3. Hypertension: Holding antihypertensives until pacemaker implantation 4. Interstitial lung disease: On home oxygen 2 L.  Severity of Illness: The appropriate patient status for this patient is OBSERVATION. Observation status is judged to be reasonable and necessary in order to provide the required intensity of service to ensure the patient's safety. The patient's presenting symptoms, physical exam findings, and initial radiographic and laboratory data in the context of their medical condition is felt to place them at decreased risk for further clinical deterioration. Furthermore, it is anticipated that the patient Paul Cross be medically stable for discharge from the hospital within 2 midnights of admission. The following factors support the patient status of observation.   " The patient's presenting symptoms include syncope. " The physical exam findings include bradycardia. " The initial radiographic and laboratory data are second-degree heart block with 3-1 conduction.     For questions or updates, please contact [place] Please consult [email or web address] for contact info under        Signed, Katai Marsico Meredith Leeds, MD  09/06/2018 3:05 PM

## 2018-09-06 NOTE — ED Notes (Signed)
[facility] at bedsidee.

## 2018-09-07 ENCOUNTER — Encounter (HOSPITAL_COMMUNITY): Payer: Self-pay | Admitting: Cardiology

## 2018-09-07 ENCOUNTER — Other Ambulatory Visit: Payer: Self-pay

## 2018-09-07 ENCOUNTER — Observation Stay (HOSPITAL_COMMUNITY): Payer: Medicare Other

## 2018-09-07 DIAGNOSIS — R55 Syncope and collapse: Secondary | ICD-10-CM

## 2018-09-07 DIAGNOSIS — J849 Interstitial pulmonary disease, unspecified: Secondary | ICD-10-CM | POA: Diagnosis not present

## 2018-09-07 DIAGNOSIS — I442 Atrioventricular block, complete: Secondary | ICD-10-CM | POA: Diagnosis not present

## 2018-09-07 DIAGNOSIS — I441 Atrioventricular block, second degree: Secondary | ICD-10-CM | POA: Diagnosis not present

## 2018-09-07 DIAGNOSIS — N179 Acute kidney failure, unspecified: Secondary | ICD-10-CM | POA: Diagnosis not present

## 2018-09-07 DIAGNOSIS — J9610 Chronic respiratory failure, unspecified whether with hypoxia or hypercapnia: Secondary | ICD-10-CM | POA: Diagnosis not present

## 2018-09-07 LAB — BASIC METABOLIC PANEL
Anion gap: 10 (ref 5–15)
BUN: 31 mg/dL — ABNORMAL HIGH (ref 8–23)
CO2: 25 mmol/L (ref 22–32)
Calcium: 9.9 mg/dL (ref 8.9–10.3)
Chloride: 105 mmol/L (ref 98–111)
Creatinine, Ser: 1.33 mg/dL — ABNORMAL HIGH (ref 0.61–1.24)
GFR calc Af Amer: 51 mL/min — ABNORMAL LOW (ref >60)
GFR calc non Af Amer: 44 mL/min — ABNORMAL LOW (ref >60)
GLUCOSE: 109 mg/dL — AB (ref 70–99)
Potassium: 4.7 mmol/L (ref 3.5–5.1)
Sodium: 140 mmol/L (ref 135–145)

## 2018-09-07 MED ORDER — TAMSULOSIN HCL 0.4 MG PO CAPS: 0.4000 mg | ORAL_CAPSULE | Freq: Every day | ORAL | Status: AC

## 2018-09-07 MED FILL — Lidocaine HCl Local Inj 1%: INTRAMUSCULAR | Qty: 60 | Status: AC

## 2018-09-07 NOTE — NC FL2 (Signed)
[place] MEDICAID FL2 LEVEL OF CARE SCREENING TOOL     IDENTIFICATION  Patient Name: Paul Cross Birthdate: 1918/03/29 Sex: male Admission Date (Current Location): 09/06/2018  [facility] and [place] Number:  Engineering geologist and Address:  The [place], [place] [address] [place], [place] [address]      Provider Number: [id number]  Attending Physician Name and Address:  Constance Haw, MD  Relative Name and Phone Number:       Current Level of Care: Hospital Recommended Level of Care: [place] Prior Approval Number:    Date Approved/Denied:   PASRR Number: [id number]  Discharge Plan: SNF    Current Diagnoses: Patient Active Problem List   Diagnosis Date Noted  . Heart block AV complete ([place]) 09/06/2018  . AKI (acute kidney injury) ([place]) 09/06/2018  . Syncope and collapse 09/06/2018  . Complete heart block ([place]) 09/06/2018  . Second degree AV block 09/06/2018  . BPH (benign prostatic hyperplasia) 07/11/2018  . Pressure injury of skin of left buttock 07/11/2018  . Elevated glucose 02/08/2018  . Proteinuria 02/08/2018  . Anxiety and depression 02/08/2018  . Pulmonary fibrosis ([place]) 02/08/2018  . Hypertension 02/08/2018  . Hypothyroidism 02/08/2018  . Shingles 02/08/2018  . Adrenal adenoma, right 02/08/2018  . Abdominal aortic aneurysm (AAA) 3.0 cm to 5.5 cm in diameter in male ([place]) 02/08/2018  . Respiratory failure ([place]) 01/01/2018  . Aftercare following surgery 05/04/2015    Orientation RESPIRATION BLADDER Height & Weight     Self, Time, Situation, Place  O2(Nasal Canula 2 L) Continent Weight: 192 lb (87.1 kg) Height:  6' (182.9 cm)  BEHAVIORAL SYMPTOMS/MOOD NEUROLOGICAL BOWEL NUTRITION STATUS  (None) (None) Continent Diet(Heart healthy)  AMBULATORY STATUS COMMUNICATION OF NEEDS Skin   Limited Assist Verbally Normal                       Personal Care Assistance Level of Assistance             Functional Limitations Info  Sight, Hearing, Speech Sight Info: Adequate Hearing Info: Adequate Speech Info: Adequate    SPECIAL CARE FACTORS FREQUENCY  PT (By licensed PT)     PT Frequency: 5 x week              Contractures Contractures Info: Not present    Additional Factors Info  Code Status, Allergies Code Status Info: Full Allergies Info: Diphenhydramine Hcl, Simvastatin.           Current Medications (09/07/2018):  This is the current hospital active medication list Current Facility-Administered Medications  Medication Dose Route Frequency Provider Last Rate Last Dose  . acetaminophen (TYLENOL) tablet 650 mg  650 mg Oral Q4H PRN Baldwin Jamaica, PA-C   650 mg at 09/06/18 1831  . amLODipine (NORVASC) tablet 5 mg  5 mg Oral Daily Camnitz, Will Hassell Done, MD   5 mg at 09/07/18 1056  . aspirin EC tablet 81 mg  81 mg Oral Daily Constance Haw, MD   81 mg at 09/07/18 1056  . ceFAZolin (ANCEF) IVPB 1 g/50 mL premix  1 g Intravenous Q6H Camnitz, Will Hassell Done, MD 100 mL/hr at 09/07/18 0325 1 g at 09/07/18 0325  . cholecalciferol (VITAMIN D3) tablet 1,000 Units  1,000 Units Oral Daily Constance Haw, MD   1,000 Units at 09/07/18 1056  . fluticasone furoate-vilanterol (BREO ELLIPTA) 200-25 MCG/INH 1 puff  1 puff Inhalation Daily Camnitz, Ocie Doyne, MD  1 puff at 09/07/18 0736  . hydrocortisone cream 1 % 1 application  1 application Topical BID Constance Haw, MD   1 application at 06/00/45 1100  . levothyroxine (SYNTHROID, LEVOTHROID) tablet 137 mcg  137 mcg Oral QAC breakfast Constance Haw, MD   137 mcg at 09/07/18 0554  . multivitamin with minerals tablet 1 tablet  1 tablet Oral Daily Camnitz, Ocie Doyne, MD   1 tablet at 09/07/18 1056  . nitroGLYCERIN (NITROSTAT) SL tablet 0.4 mg  0.4 mg Sublingual Q5 Min x 3 PRN Baldwin Jamaica, PA-C      . ondansetron [facility]) injection 4 mg  4 mg Intravenous Q6H PRN Camnitz, Will Hassell Done, MD      . tamsulosin  (FLOMAX) capsule 0.4 mg  0.4 mg Oral QPC supper Baldwin Jamaica, PA-C   0.4 mg at 09/06/18 1831  . umeclidinium bromide (INCRUSE ELLIPTA) 62.5 MCG/INH 1 puff  1 puff Inhalation Daily Constance Haw, MD   1 puff at 09/07/18 0736  . vitamin B-12 (CYANOCOBALAMIN) tablet 1,000 mcg  1,000 mcg Oral Daily Constance Haw, MD   1,000 mcg at 09/07/18 1056   Facility-Administered Medications Ordered in Other Encounters  Medication Dose Route Frequency Provider Last Rate Last Dose  . albuterol (PROVENTIL) (2.5 MG/3ML) 0.083% nebulizer solution 2.5 mg  2.5 mg Nebulization Once Juanito Doom, MD         Discharge Medications: Please see discharge summary for a list of discharge medications.  Relevant Imaging Results:  Relevant Lab Results:   Additional Information SS#: [id number]  Candie Chroman, LCSW

## 2018-09-07 NOTE — TOC Transition Note (Signed)
Transition of Care [facility]) - CM/SW Discharge Note   Patient Details  Name: Paul Cross MRN: [id number] Date of Birth: [birth year]  Transition of [facility]) CM/SW Contact:  Candie Chroman, LCSW Phone Number: 09/07/2018, 5:10 PM   Clinical Narrative: CSW facilitated patient discharge including contacting patient family and facility to confirm patient discharge plans. Clinical information faxed to facility and family agreeable with plan. CSW arranged ambulance transport via [facility] to [facility]. RN to call report prior to discharge [phone]).  CSW will sign off for now as social work intervention is no longer needed. Please consult [place] again if new needs arise.  Final next level of care: Skilled Nursing Facility Barriers to Discharge: Barriers Resolved   Patient Goals and CMS Choice   CMS Medicare.gov Compare Post Acute Care list provided to:: Patient(Daughter at bedside.) Choice offered to / list presented to : Patient, Adult Children  Discharge Placement PASRR number recieved: 09/07/18            Patient chooses bed at: [facility] [place] Patient to be transferred to facility by: [place] Name of family member notified: [name] Patient and family notified of of transfer: 09/07/18  Discharge Plan and Services     Post Acute Care Choice: [place]          DME Arranged: N/A(Patient reports no equipment needs.)         Social Determinants of Health (SDOH) Interventions     Readmission Risk Interventions No flowsheet data found.

## 2018-09-07 NOTE — Progress Notes (Signed)
Peripheral IV infiltrated upon flushing. IV consult placed.

## 2018-09-07 NOTE — Evaluation (Signed)
Physical Therapy Evaluation Patient Details Name: Paul Cross MRN: [id number] DOB: 1918-04-08 Today's Date: 09/07/2018   History of Present Illness  Pt is a 85 y.o. male admitted 09/06/18 after fall with dizziness, malaise and weakness. Found to have bradycardia with 2nd degree AV block; s/p pacekmaker implant 3/19. PMH includes HTN, HLD.    Clinical Impression  Pt presents with an overall decrease in functional mobility secondary to above. PTA, pt mod indep with rollator, lives alone with support from daughter; has been limited by increased weakness the past week. Today, pt requiring increased assist for ambulation and ADLs; requires minA to prevent multiple LOB while walking; at high risk for falls. SpO2 down to 81-84% on 3L O2 [place] (RN aware). Discussed recommendation for SNF-level therapies; pt and daughter agreeable. Will follow acutely to address established goals.    Follow Up Recommendations SNF;Supervision for mobility/OOB    Equipment Recommendations  None recommended by PT    Recommendations for Other Services       Precautions / Restrictions Precautions Precautions: Fall;ICD/Pacemaker Restrictions Weight Bearing Restrictions: No      Mobility  Bed Mobility Overal bed mobility: Modified Independent             General bed mobility comments: Increased time and effort; DOE 4/4 requiring seated rest. SpO2 down to 81-84% on 3L O2 [place]  Transfers Overall transfer level: Needs assistance Equipment used: Rolling walker (2 wheeled) Transfers: Sit to/from Stand Sit to Stand: Min assist;Mod assist         General transfer comment: MinA to assist trunk elevation, cues for correct hand placement on RW. Min-modA to prevent posterior LOB and steady RW   Ambulation/Gait Ambulation/Gait assistance: Min Web designer (Feet): 12 Feet Assistive device: Rolling walker (2 wheeled) Gait Pattern/deviations: Step-to pattern;Leaning posteriorly;Trunk flexed Gait  velocity: Decreased Gait velocity interpretation: <1.8 ft/sec, indicate of risk for recurrent falls General Gait Details: Initial marching ~10-15 sec at EOB with RW, then required prolonged seated rest. Slow, unsteady gait additional amb 12' with RW and intermittent minA to prevent LOB due to posterior lean; DOE 3/4. Unable to get reliable pulse ox read on 4L O2 [place]  Stairs            Wheelchair Mobility    Modified Rankin (Stroke Patients Only)       Balance Overall balance assessment: Needs assistance   Sitting balance-Leahy Scale: Fair       Standing balance-Leahy Scale: Poor                               Pertinent Vitals/Pain Pain Assessment: No/denies pain    Home Living Family/patient expects to be discharged to:: Private residence Living Arrangements: Alone Available Help at Discharge: Family;Available PRN/intermittently Type of Home: House Home Access: Stairs to enter Entrance Stairs-Rails: Right Entrance Stairs-Number of Steps: 3 Home Layout: One level Home Equipment: Walker - 2 wheels;Cane - single point;Walker - 4 wheels;Shower seat;Grab bars - tub/shower;Grab bars - toilet Additional Comments: Daughter could stay with pt first couple days if needed. She lives in [place], pt lives in [place]    Prior Function Level of Independence: Needs assistance   Gait / Transfers Assistance Needed: Indep with household ambulation; uses rollator ambulating outdoors. Daughter drives  ADL's / Homemaking Assistance Needed: Daughter does houehold tasks and prepares meals. Checks on pt throughout the week  Comments: Wears 2L O2 [place] baseline     Hand Dominance  Extremity/Trunk Assessment   Upper Extremity Assessment Upper Extremity Assessment: Overall WFL for tasks assessed    Lower Extremity Assessment Lower Extremity Assessment: Generalized weakness    Cervical / Trunk Assessment Cervical / Trunk Assessment: Kyphotic  Communication    Communication: No difficulties  Cognition Arousal/Alertness: Awake/alert Behavior During Therapy: WFL for tasks assessed/performed Overall Cognitive Status: Within Functional Limits for tasks assessed                                        General Comments General comments (skin integrity, edema, etc.): Daughter present and supportive    Exercises     Assessment/Plan    PT Assessment Patient needs continued PT services  PT Problem List Decreased strength;Decreased activity tolerance;Decreased balance;Decreased mobility;Decreased knowledge of precautions;Cardiopulmonary status limiting activity       PT Treatment Interventions DME instruction;Gait training;Stair training;Functional mobility training;Therapeutic activities;Therapeutic exercise;Balance training;Patient/family education    PT Goals (Current goals can be found in the Care Plan section)  Acute Rehab PT Goals Patient Stated Goal: Get strength back PT Goal Formulation: With patient/family Time For Goal Achievement: 09/21/18 Potential to Achieve Goals: Good    Frequency Min 3X/week   Barriers to discharge Decreased caregiver support      Co-evaluation               AM-PAC PT "6 Clicks" Mobility  Outcome Measure Help needed turning from your back to your side while in a flat bed without using bedrails?: None Help needed moving from lying on your back to sitting on the side of a flat bed without using bedrails?: None Help needed moving to and from a bed to a chair (including a wheelchair)?: A Little Help needed standing up from a chair using your arms (e.g., wheelchair or bedside chair)?: A Little Help needed to walk in hospital room?: A Lot Help needed climbing 3-5 steps with a railing? : Total 6 Click Score: 17    End of Session Equipment Utilized During Treatment: Gait belt;Oxygen Activity Tolerance: Patient tolerated treatment well;Patient limited by fatigue Patient left: in  bed;with call bell/phone within reach;with family/visitor present Nurse Communication: Mobility status PT Visit Diagnosis: Other abnormalities of gait and mobility (R26.89);Muscle weakness (generalized) (M62.81)    Time: 1026-1050 PT Time Calculation (min) (ACUTE ONLY): 24 min   Charges:   PT Evaluation $PT Eval Moderate Complexity: 1 Mod PT Treatments $Gait Training: 8-22 mins      Mabeline Caras, PT, DPT Acute Rehabilitation Services  Pager [phone] Office [phone]  Derry Lory 09/07/2018, 11:03 AM

## 2018-09-07 NOTE — Progress Notes (Signed)
Orthopedic Tech Progress Note Patient Details:  Paul Cross 05/14/1918 [id number] RN said patient has on arm sling [name], male   DOB: Apr 01, 1918, 85 y.o.   MRN: [id number]   Janit Pagan 09/07/2018, 8:42 AM

## 2018-09-07 NOTE — Discharge Instructions (Signed)
° ° °  Supplemental Discharge Instructions for  Pacemaker/Defibrillator Patients     Activity No heavy lifting or vigorous activity with your left/right arm for 6 to 8 weeks.  Do not raise your left/right arm above your head for one week.  Gradually raise your affected arm as drawn below.              09/10/2018                 01-Oct-2018               09/12/2018              09/13/2018 __  NO DRIVING until cleared to at your wound check visit .  WOUND CARE - Keep the wound area clean and dry.  Do not get this area wet, no showers untili cleared to at your wound check visit . - The tape/steri-strips on your wound will fall off; do not pull them off.  No bandage is needed on the site.  DO  NOT apply any creams, oils, or ointments to the wound area. - If you notice any drainage or discharge from the wound, any swelling or bruising at the site, or you develop a fever > 101? F after you are discharged home, call the office at once.  Special Instructions - You are still able to use cellular telephones; use the ear opposite the side where you have your pacemaker/defibrillator.  Avoid carrying your cellular phone near your device. - When traveling through airports, show security personnel your identification card to avoid being screened in the metal detectors.  Ask the security personnel to use the hand wand. - Avoid arc welding equipment, MRI testing (magnetic resonance imaging), TENS units (transcutaneous nerve stimulators).  Call the office for questions about other devices. - Avoid electrical appliances that are in poor condition or are not properly grounded. - Microwave ovens are safe to be near or to operate.

## 2018-09-07 NOTE — Discharge Summary (Addendum)
ELECTROPHYSIOLOGY PROCEDURE DISCHARGE SUMMARY    [name],  MRN: [id number], DOB/AGE: 10/12/1917 85 y.o.  Admit date: 09/06/2018 Discharge date: 09/07/2018  Primary Care Physician: Leone Haven, MD  Primary Cardiologist: New to Dr. Rockey Situ this admission Electrophysiologist: new to Dr. Curt Bears, this admission  Primary Discharge Diagnosis:  1. Symptomatic bradycardia, advanced high degree AVBlock  Secondary Discharge Diagnosis:  1. ILD on home O2 2. Chronic respiratory failure 3. Emphysema     Follows with Dr. [place] Bells 4. HTN  Allergies  Allergen Reactions   Diphenhydramine Hcl Other (See Comments)    Reaction:  Unknown    Simvastatin Other (See Comments)    Reaction:  Unknown      Procedures This Admission:  1.  Implantation of a SJM dual chamber PPM on 09/06/2018 by Dr Curt Bears.  The patient received [facility] model [id number] (serial number  [id number]) right atrial lead and a [employer] model [id number] (serial number  [id number]) right ventricular lead, St Jude Medical Assurity MRI  model [id number] (serial number  [id number] ) pacemaker  There were no immediate post procedure complications. 2.  CXR on 09/07/2018 demonstrated no pneumothorax status post device implantation.   Brief HPI: Paul Cross is a 85 y.o. male was initially seen at [facility] ER with 3 days of dizziness and generalized malaise/fatigue, On 09/05/2018 he was walking in his house and suffered a syncopal episode hitting his shoulder on the door jam.  He did not hit his head, he was able to get himself up and called his daughter.  Yesterday he was brought to the ED this morning where he was noted to be in high-grade AV block, no reversible causes were found and [facility] to [facility] PPM implant.   Hospital Course:  The patient was admitted and underwent implantation of a PPM with details as outlined above.  He was monitored on telemetry overnight which demonstrated SR/V  pacing.  Left chest was without hematoma or ecchymosis.  CXR was obtained and demonstrated no pneumothorax status post device implantation.   He and his daughter are requested to send a remote transmission today.   Wound care, arm mobility, and restrictions were reviewed with the patient.  The patient prior to his hospitalization had been feeling weak for days and not doing much if anything in the way of physical activity, he has had rapid deconditioning and today felt quite weak, suspect 2/2 to this.  He is otherwise feeling much better.  PT evaluation was done and recommended SNF for rehab.  In d/w the patient and his daughter in current environment of Covid 44, facility placement event short term carries exposure risk.  Both the patient and daughter report understanding, though feel he is unsafe to be home even with the help of his daughter and prefer he go to SNF The patient was examined by Dr. Curt Bears and considered stable for discharge to SNF once bed is available.   Remote transmission was sent from here, our device clinic [facility] [place] get, verbally from SJM rep via telephone looks OK  He is chronically  On home O2, reported 2L at rest and 4L with exercise/exertion per the patient/daughter.  This has been ordered for SNF.   Supplemental Discharge Instructions for  Pacemaker/Defibrillator Patients     Activity No heavy lifting or vigorous activity with your left/right arm for 6 to 8 weeks.  Do not raise your left/right arm above your head for one week.  Gradually  raise your affected arm as drawn below.              09/10/2018                 09-29-2018               09/12/2018              09/13/2018 __  NO DRIVING until cleared to at your wound check visit .  WOUND CARE - Keep the wound area clean and dry.  Do not get this area wet, no showers untili cleared to at your wound check visit . - The tape/steri-strips on your wound Rhianon Zabawa fall off; do not pull them off.  No bandage is needed on the site.   DO  NOT apply any creams, oils, or ointments to the wound area. - If you notice any drainage or discharge from the wound, any swelling or bruising at the site, or you develop a fever > 101? F after you are discharged home, call the office at once.  Special Instructions - You are still able to use cellular telephones; use the ear opposite the side where you have your pacemaker/defibrillator.  Avoid carrying your cellular phone near your device. - When traveling through airports, show security personnel your identification card to avoid being screened in the metal detectors.  Ask the security personnel to use the hand wand. - Avoid arc welding equipment, MRI testing (magnetic resonance imaging), TENS units (transcutaneous nerve stimulators).  Call the office for questions about other devices. - Avoid electrical appliances that are in poor condition or are not properly grounded. - Microwave ovens are safe to be near or to operate.    Physical Exam: Vitals:   09/06/18 2028 09/07/18 0207 09/07/18 0739 09/07/18 1011  BP: (!) 120/103   135/72  Pulse: 68   79  Resp: 18   19  Temp: 98.6 F (37 C)   97.8 F (36.6 C)  TempSrc: Oral   Oral  SpO2: 95%  95% 90%  Weight:  87.1 kg    Height:        GEN- The patient is well appearing, alert and oriented x 3 today.   HEENT: normocephalic, atraumatic; sclera clear, conjunctiva pink; hearing intact; oropharynx clear; neck supple, no JVP Lungs- CTA b/l, normal work of breathing.  No wheezes, rales, rhonchi Heart- RRR, no murmurs, rubs or gallops, PMI not laterally displaced GI- soft, non-tender, non-distended Extremities- no clubbing, cyanosis, or edema MS- no significant deformity or atrophy Skin- warm and dry, no rash or lesion, left chest without hematoma/ecchymosis Psych- euthymic mood, full affect Neuro- no gross deficits   Labs:   Lab Results  Component Value Date   WBC 7.2 09/06/2018   HGB 12.6 (L) 09/06/2018   HCT 38.3 (L) 09/06/2018    MCV 100.3 (H) 09/06/2018   PLT 144 (L) 09/06/2018    Recent Labs  Lab 09/06/18 1011 09/07/18 0753  NA 136 140  K 5.0 4.7  CL 104 105  CO2 24 25  BUN 47* 31*  CREATININE 1.61* 1.33*  CALCIUM 9.5 9.9  PROT 6.8  --   BILITOT 0.9  --   ALKPHOS 35*  --   ALT 16  --   AST 18  --   GLUCOSE 121* 109*    Discharge Medications:  Allergies as of 09/07/2018      Reactions   Diphenhydramine Hcl Other (See Comments)   Reaction:  Unknown  Simvastatin Other (See Comments)   Reaction:  Unknown       Medication List    TAKE these medications   acetaminophen 325 MG tablet Commonly known as:  TYLENOL Take 975 mg by mouth 3 (three) times daily as needed for mild pain, moderate pain, fever or headache.   amLODipine 10 MG tablet Commonly known as:  NORVASC Take 5 mg by mouth daily.   aspirin EC 81 MG tablet Take 81 mg by mouth daily.   budesonide-formoterol 160-4.5 MCG/ACT inhaler Commonly known as:  SYMBICORT Inhale 2 puffs into the lungs 2 (two) times daily.   cholecalciferol 1000 units tablet Commonly known as:  VITAMIN D Take 1,000 Units by mouth daily.   hydrocortisone cream 1 % Apply 1 application topically 2 (two) times daily. FOR TAILBONE   levothyroxine 137 MCG tablet Commonly known as:  SYNTHROID, LEVOTHROID Take 137 mcg by mouth daily before breakfast.   multivitamin capsule Take 1 capsule by mouth daily.   tamsulosin 0.4 MG Caps capsule Commonly known as:  FLOMAX Take 1 capsule (0.4 mg total) by mouth at bedtime.   tiotropium 18 MCG inhalation capsule Commonly known as:  Spiriva HandiHaler Place 1 capsule (18 mcg total) into inhaler and inhale daily.   Tiotropium Bromide Monohydrate 2.5 MCG/ACT Aers Commonly known as:  Spiriva Respimat Inhale 2 puffs into the lungs daily.   vitamin B-12 1000 MCG tablet Commonly known as:  CYANOCOBALAMIN Take 1,000 mcg by mouth daily.            Durable Medical Equipment  (From admission, onward)          Start     Ordered   09/07/18 0000  For home use only DME oxygen    Comments:  Patient on chronic home oxygen.  Reports typically 2L at rest and 4L at rehab/with exercise  Question Answer Comment  Mode or (Route) Nasal cannula   Liters per Minute 4   Oxygen delivery system Gas      09/07/18 1210          Disposition: Home  Discharge Instructions    Diet - low sodium heart healthy   Complete by:  As directed    For home use only DME oxygen   Complete by:  As directed    Patient on chronic home oxygen.  Reports typically 2L at rest and 4L at rehab/with exercise   Mode or (Route):  Nasal cannula   Liters per Minute:  4   Oxygen delivery system:  Gas   Increase activity slowly   Complete by:  As directed      Follow-up Information    [facility] [employer] Follow up.   Specialty:  Cardiology Why:  09/18/2018 @ 11;30AM, wound check visit Contact information: [address] [place] [phone]       Constance Haw, MD Follow up.   Specialty:  Cardiology Why:  12/11/2018 @ 2:00PM Contact information: [place] [address] [phone]           Duration of Discharge Encounter: Greater than 30 minutes including physician time.  Venetia Night, PA-C 09/07/2018 12:11 PM  I have seen and examined this patient with Tommye Standard.  Agree with above, note added to reflect my findings.  On exam, RRR, no murmurs, lungs clear.  She is now status post St. Jude pacemaker for 3:1 AV block.  Device functioning appropriately.  Chest  x-ray and interrogation without issue.  Plan for discharge today with follow-up in device clinic. Plan for a short stay at rehab prior to returning home.  [place] Skalicky M. Muad Noga MD 09/07/2018 1:06 PM

## 2018-09-07 NOTE — Progress Notes (Signed)
[place] and spoke with Nani RN to give report. Patient is dressed in bed, nasal canula on 3L. Peripheral iv removed, clean dry and intact, pressure and dressing applied. CCMD called.

## 2018-09-07 NOTE — TOC Initial Note (Addendum)
Transition of Care [facility]) - Initial/Assessment Note    Patient Details  Name: Paul Cross MRN: [id number] Date of Birth: 20-Oct-1917  Transition of [facility]) CM/SW Contact:    Candie Chroman, LCSW Phone Number: 09/07/2018, 2:41 PM  Clinical Narrative: CSW met with patient. Daughter at bedside. CSW introduced role and explained that PT recommendations would be discussed. Patient and his daughter agreeable to SNF placement. First preference is [facility] but they will not have a bed open up until late next week. Second preference is [facility]. Asked admissions coordinator to review referral. Patient is under Medicare observation. He has not had a 3 midnight inpatient stay in the last 30 days and not a [facility] participant. CSW received information on Monday about a COVID-19 Emergency Environmental manager. CSW provided patient's daughter with information on this program and notified her that CSW can work on placement but insurance coverage cannot be guaranteed. Patient and his daughter expressed understanding. They are also aware that due to COVID-19 visitor restrictions at [facility], daughter will not be able to visit. No further concerns. CSW encouraged patient and his daughter to contact CSW as needed. CSW will continue to follow patient and his daughter for support and facilitate discharge to SNF once bed available. If patient unable to obtain waiver, they prefer [place] services. Patient reports he does not need any equipment at home.               Expected Discharge Plan: Skilled Nursing Facility Barriers to Discharge: SNF Pending bed offer, Other (comment)(Medicare observation. No 3-midnight inpatient stay within the last 30 days. Not [facility]. Trying to COVID-19 Emergency Declaration Waiver.)   Patient Goals and CMS Choice   CMS Medicare.gov Compare Post Acute Care list provided to:: Patient(Daughter at bedside.)    Expected Discharge Plan and Services Expected Discharge Plan:  [place] Choice: [place] arrangements for the past 2 months: Single Family Home Expected Discharge Date: 09/07/18               DME Arranged: N/A(Patient reports no equipment needs.)        Prior Living Arrangements/Services Living arrangements for the past 2 months: Single Family Home Lives with:: Self Patient language and need for interpreter reviewed:: No Do you feel safe going back to the place where you live?: Yes      Need for Family Participation in Patient Care: Yes (Comment) Care giver support system in place?: No (comment)   Criminal Activity/Legal Involvement Pertinent to Current Situation/Hospitalization: No - Comment as needed  Activities of Daily Living Home Assistive Devices/Equipment: None ADL Screening (condition at time of admission) Patient's cognitive ability adequate to safely complete daily activities?: No Is the patient deaf or have difficulty hearing?: Yes Does the patient have difficulty seeing, even when wearing glasses/contacts?: No Does the patient have difficulty concentrating, remembering, or making decisions?: No Patient able to express need for assistance with ADLs?: No Does the patient have difficulty dressing or bathing?: No Independently performs ADLs?: Yes (appropriate for developmental age) Does the patient have difficulty walking or climbing stairs?: Yes Weakness of Legs: None Weakness of Arms/Hands: None  Permission Sought/Granted Permission sought to share information with : Facility Sport and exercise psychologist, Family Supports Permission granted to share information with : Yes, Verbal Permission Granted  Share Information with NAME: Paul Cross  Permission granted to share info w AGENCY: SNF's  Permission granted to share info w Relationship:  Daughter  Permission granted to share info w Contact Information: [phone]  Emotional Assessment Appearance:: Appears stated  age Attitude/Demeanor/Rapport: Engaged, Gracious Affect (typically observed): Accepting, Appropriate, Calm, Pleasant Orientation: : Oriented to Self, Oriented to Place, Oriented to  Time, Oriented to Situation Alcohol / Substance Use: Never Used Psych Involvement: No (comment)  Admission diagnosis:  Second degree AV block [I44.1] Patient Active Problem List   Diagnosis Date Noted  . Heart block AV complete ([place]) 09/06/2018  . AKI (acute kidney injury) ([place]) 09/06/2018  . Syncope and collapse 09/06/2018  . Complete heart block ([place]) 09/06/2018  . Second degree AV block 09/06/2018  . BPH (benign prostatic hyperplasia) 07/11/2018  . Pressure injury of skin of left buttock 07/11/2018  . Elevated glucose 02/08/2018  . Proteinuria 02/08/2018  . Anxiety and depression 02/08/2018  . Pulmonary fibrosis ([place]) 02/08/2018  . Hypertension 02/08/2018  . Hypothyroidism 02/08/2018  . Shingles 02/08/2018  . Adrenal adenoma, right 02/08/2018  . Abdominal aortic aneurysm (AAA) 3.0 cm to 5.5 cm in diameter in male ([place]) 02/08/2018  . Respiratory failure ([place]) 01/01/2018  . Aftercare following surgery 05/04/2015   PCP:  Leone Haven, MD Pharmacy:   [id number] [place] [place] [id number]: [phone]: [phone]    Social Determinants of Health (SDOH) Interventions    Readmission Risk Interventions No flowsheet data found.

## 2018-09-08 ENCOUNTER — Encounter: Payer: Self-pay | Admitting: Pulmonary Disease

## 2018-09-08 ENCOUNTER — Emergency Department: Payer: Medicare Other

## 2018-09-08 DIAGNOSIS — I2510 Atherosclerotic heart disease of native coronary artery without angina pectoris: Secondary | ICD-10-CM | POA: Diagnosis present

## 2018-09-08 DIAGNOSIS — I340 Nonrheumatic mitral (valve) insufficiency: Secondary | ICD-10-CM | POA: Diagnosis not present

## 2018-09-08 DIAGNOSIS — Z79899 Other long term (current) drug therapy: Secondary | ICD-10-CM

## 2018-09-08 DIAGNOSIS — I25119 Atherosclerotic heart disease of native coronary artery with unspecified angina pectoris: Secondary | ICD-10-CM

## 2018-09-08 DIAGNOSIS — Z950 Presence of cardiac pacemaker: Secondary | ICD-10-CM

## 2018-09-08 DIAGNOSIS — Z66 Do not resuscitate: Secondary | ICD-10-CM

## 2018-09-08 DIAGNOSIS — R0902 Hypoxemia: Secondary | ICD-10-CM

## 2018-09-08 DIAGNOSIS — R739 Hyperglycemia, unspecified: Secondary | ICD-10-CM | POA: Diagnosis present

## 2018-09-08 DIAGNOSIS — F05 Delirium due to known physiological condition: Secondary | ICD-10-CM | POA: Diagnosis present

## 2018-09-08 DIAGNOSIS — J849 Interstitial pulmonary disease, unspecified: Secondary | ICD-10-CM | POA: Diagnosis not present

## 2018-09-08 DIAGNOSIS — N183 Chronic kidney disease, stage 3 (moderate): Secondary | ICD-10-CM | POA: Diagnosis present

## 2018-09-08 DIAGNOSIS — F0390 Unspecified dementia without behavioral disturbance: Secondary | ICD-10-CM | POA: Diagnosis present

## 2018-09-08 DIAGNOSIS — R350 Frequency of micturition: Secondary | ICD-10-CM | POA: Diagnosis present

## 2018-09-08 DIAGNOSIS — E785 Hyperlipidemia, unspecified: Secondary | ICD-10-CM | POA: Diagnosis present

## 2018-09-08 DIAGNOSIS — I214 Non-ST elevation (NSTEMI) myocardial infarction: Principal | ICD-10-CM

## 2018-09-08 DIAGNOSIS — I255 Ischemic cardiomyopathy: Secondary | ICD-10-CM | POA: Diagnosis not present

## 2018-09-08 DIAGNOSIS — Z7951 Long term (current) use of inhaled steroids: Secondary | ICD-10-CM

## 2018-09-08 DIAGNOSIS — J9600 Acute respiratory failure, unspecified whether with hypoxia or hypercapnia: Secondary | ICD-10-CM | POA: Diagnosis present

## 2018-09-08 DIAGNOSIS — J189 Pneumonia, unspecified organism: Secondary | ICD-10-CM | POA: Diagnosis present

## 2018-09-08 DIAGNOSIS — J440 Chronic obstructive pulmonary disease with acute lower respiratory infection: Secondary | ICD-10-CM | POA: Diagnosis present

## 2018-09-08 DIAGNOSIS — N179 Acute kidney failure, unspecified: Secondary | ICD-10-CM | POA: Diagnosis present

## 2018-09-08 DIAGNOSIS — Z87891 Personal history of nicotine dependence: Secondary | ICD-10-CM

## 2018-09-08 DIAGNOSIS — E039 Hypothyroidism, unspecified: Secondary | ICD-10-CM | POA: Diagnosis present

## 2018-09-08 DIAGNOSIS — J9621 Acute and chronic respiratory failure with hypoxia: Secondary | ICD-10-CM | POA: Diagnosis present

## 2018-09-08 DIAGNOSIS — I5021 Acute systolic (congestive) heart failure: Secondary | ICD-10-CM | POA: Diagnosis present

## 2018-09-08 DIAGNOSIS — R7989 Other specified abnormal findings of blood chemistry: Secondary | ICD-10-CM

## 2018-09-08 DIAGNOSIS — I130 Hypertensive heart and chronic kidney disease with heart failure and stage 1 through stage 4 chronic kidney disease, or unspecified chronic kidney disease: Secondary | ICD-10-CM | POA: Diagnosis present

## 2018-09-08 DIAGNOSIS — R0609 Other forms of dyspnea: Secondary | ICD-10-CM | POA: Diagnosis not present

## 2018-09-08 DIAGNOSIS — E875 Hyperkalemia: Secondary | ICD-10-CM | POA: Diagnosis present

## 2018-09-08 DIAGNOSIS — Z823 Family history of stroke: Secondary | ICD-10-CM

## 2018-09-08 DIAGNOSIS — I272 Pulmonary hypertension, unspecified: Secondary | ICD-10-CM | POA: Diagnosis present

## 2018-09-08 DIAGNOSIS — R443 Hallucinations, unspecified: Secondary | ICD-10-CM | POA: Diagnosis present

## 2018-09-08 DIAGNOSIS — Z7982 Long term (current) use of aspirin: Secondary | ICD-10-CM

## 2018-09-08 DIAGNOSIS — Z9049 Acquired absence of other specified parts of digestive tract: Secondary | ICD-10-CM

## 2018-09-08 DIAGNOSIS — Z809 Family history of malignant neoplasm, unspecified: Secondary | ICD-10-CM

## 2018-09-08 DIAGNOSIS — J9602 Acute respiratory failure with hypercapnia: Secondary | ICD-10-CM | POA: Diagnosis not present

## 2018-09-08 DIAGNOSIS — G9341 Metabolic encephalopathy: Secondary | ICD-10-CM | POA: Diagnosis present

## 2018-09-08 DIAGNOSIS — I959 Hypotension, unspecified: Secondary | ICD-10-CM | POA: Diagnosis present

## 2018-09-08 DIAGNOSIS — I2699 Other pulmonary embolism without acute cor pulmonale: Secondary | ICD-10-CM

## 2018-09-08 DIAGNOSIS — Z833 Family history of diabetes mellitus: Secondary | ICD-10-CM

## 2018-09-08 DIAGNOSIS — N400 Enlarged prostate without lower urinary tract symptoms: Secondary | ICD-10-CM | POA: Diagnosis present

## 2018-09-08 DIAGNOSIS — Z515 Encounter for palliative care: Secondary | ICD-10-CM | POA: Diagnosis not present

## 2018-09-08 DIAGNOSIS — I361 Nonrheumatic tricuspid (valve) insufficiency: Secondary | ICD-10-CM | POA: Diagnosis not present

## 2018-09-08 DIAGNOSIS — Z818 Family history of other mental and behavioral disorders: Secondary | ICD-10-CM

## 2018-09-08 DIAGNOSIS — R410 Disorientation, unspecified: Secondary | ICD-10-CM | POA: Diagnosis not present

## 2018-09-08 DIAGNOSIS — I509 Heart failure, unspecified: Secondary | ICD-10-CM

## 2018-09-08 DIAGNOSIS — I442 Atrioventricular block, complete: Secondary | ICD-10-CM | POA: Diagnosis not present

## 2018-09-08 DIAGNOSIS — J9601 Acute respiratory failure with hypoxia: Secondary | ICD-10-CM | POA: Diagnosis not present

## 2018-09-08 DIAGNOSIS — R0600 Dyspnea, unspecified: Secondary | ICD-10-CM

## 2018-09-08 DIAGNOSIS — Z79890 Hormone replacement therapy (postmenopausal): Secondary | ICD-10-CM

## 2018-09-08 DIAGNOSIS — R778 Other specified abnormalities of plasma proteins: Secondary | ICD-10-CM

## 2018-09-08 DIAGNOSIS — Z8249 Family history of ischemic heart disease and other diseases of the circulatory system: Secondary | ICD-10-CM

## 2018-09-08 HISTORY — DX: Interstitial pulmonary disease, unspecified: J84.9

## 2018-09-08 LAB — CBC WITH DIFFERENTIAL/PLATELET
ABS IMMATURE GRANULOCYTES: 0.15 10*3/uL — AB (ref 0.00–0.07)
Basophils Absolute: 0 10*3/uL (ref 0.0–0.1)
Basophils Relative: 0 %
Eosinophils Absolute: 0.2 10*3/uL (ref 0.0–0.5)
Eosinophils Relative: 2 %
HCT: 40.5 % (ref 39.0–52.0)
Hemoglobin: 13.3 g/dL (ref 13.0–17.0)
Immature Granulocytes: 1 %
Lymphocytes Relative: 9 %
Lymphs Abs: 1.2 10*3/uL (ref 0.7–4.0)
MCH: 32.4 pg (ref 26.0–34.0)
MCHC: 32.8 g/dL (ref 30.0–36.0)
MCV: 98.8 fL (ref 80.0–100.0)
Monocytes Absolute: 1.3 10*3/uL — ABNORMAL HIGH (ref 0.1–1.0)
Monocytes Relative: 10 %
NEUTROS ABS: 10.4 10*3/uL — AB (ref 1.7–7.7)
Neutrophils Relative %: 78 %
Platelets: 168 10*3/uL (ref 150–400)
RBC: 4.1 MIL/uL — ABNORMAL LOW (ref 4.22–5.81)
RDW: 13.2 % (ref 11.5–15.5)
WBC: 13.3 10*3/uL — ABNORMAL HIGH (ref 4.0–10.5)
nRBC: 0.2 % (ref 0.0–0.2)

## 2018-09-08 LAB — COMPREHENSIVE METABOLIC PANEL
ALT: 102 U/L — ABNORMAL HIGH (ref 0–44)
AST: 236 U/L — ABNORMAL HIGH (ref 15–41)
Albumin: 4 g/dL (ref 3.5–5.0)
Alkaline Phosphatase: 44 U/L (ref 38–126)
Anion gap: 11 (ref 5–15)
BUN: 42 mg/dL — AB (ref 8–23)
CO2: 24 mmol/L (ref 22–32)
Calcium: 9.5 mg/dL (ref 8.9–10.3)
Chloride: 101 mmol/L (ref 98–111)
Creatinine, Ser: 1.83 mg/dL — ABNORMAL HIGH (ref 0.61–1.24)
GFR calc Af Amer: 35 mL/min — ABNORMAL LOW (ref >60)
GFR calc non Af Amer: 30 mL/min — ABNORMAL LOW (ref >60)
Glucose, Bld: 163 mg/dL — ABNORMAL HIGH (ref 70–99)
Potassium: 5.4 mmol/L — ABNORMAL HIGH (ref 3.5–5.1)
Sodium: 136 mmol/L (ref 135–145)
Total Bilirubin: 2.2 mg/dL — ABNORMAL HIGH (ref 0.3–1.2)
Total Protein: 7.6 g/dL (ref 6.5–8.1)

## 2018-09-08 LAB — URINALYSIS, COMPLETE (UACMP) WITH MICROSCOPIC
Bilirubin Urine: NEGATIVE
Glucose, UA: NEGATIVE mg/dL
Hgb urine dipstick: NEGATIVE
Ketones, ur: NEGATIVE mg/dL
Leukocytes,Ua: NEGATIVE
Nitrite: NEGATIVE
Protein, ur: 30 mg/dL — AB
Specific Gravity, Urine: 1.018 (ref 1.005–1.030)
pH: 5 (ref 5.0–8.0)

## 2018-09-08 LAB — BASIC METABOLIC PANEL
ANION GAP: 12 (ref 5–15)
BUN: 42 mg/dL — ABNORMAL HIGH (ref 8–23)
CO2: 24 mmol/L (ref 22–32)
Calcium: 8.9 mg/dL (ref 8.9–10.3)
Chloride: 105 mmol/L (ref 98–111)
Creatinine, Ser: 1.67 mg/dL — ABNORMAL HIGH (ref 0.61–1.24)
GFR calc Af Amer: 39 mL/min — ABNORMAL LOW (ref >60)
GFR calc non Af Amer: 34 mL/min — ABNORMAL LOW (ref >60)
Glucose, Bld: 127 mg/dL — ABNORMAL HIGH (ref 70–99)
Potassium: 4.5 mmol/L (ref 3.5–5.1)
Sodium: 141 mmol/L (ref 135–145)

## 2018-09-08 LAB — BRAIN NATRIURETIC PEPTIDE: B Natriuretic Peptide: 1535 pg/mL — ABNORMAL HIGH (ref 0.0–100.0)

## 2018-09-08 LAB — PHOSPHORUS: Phosphorus: 3.7 mg/dL (ref 2.5–4.6)

## 2018-09-08 LAB — MAGNESIUM: Magnesium: 2 mg/dL (ref 1.7–2.4)

## 2018-09-08 LAB — PROCALCITONIN: Procalcitonin: 0.86 ng/mL

## 2018-09-08 LAB — TROPONIN I
Troponin I: 17.44 ng/mL (ref <0.03)
Troponin I: 28.59 ng/mL (ref <0.03)
Troponin I: 55.99 ng/mL (ref <0.03)

## 2018-09-08 LAB — MRSA PCR SCREENING: MRSA by PCR: NEGATIVE

## 2018-09-08 LAB — PROTIME-INR
INR: 1.2 (ref 0.8–1.2)
PROTHROMBIN TIME: 15.4 s — AB (ref 11.4–15.2)

## 2018-09-08 LAB — GLUCOSE, CAPILLARY: Glucose-Capillary: 128 mg/dL — ABNORMAL HIGH (ref 70–99)

## 2018-09-08 LAB — APTT: aPTT: 36 seconds (ref 24–36)

## 2018-09-08 MED ORDER — VITAMIN D3 25 MCG (1000 UNIT) PO TABS
1000.0000 [IU] | ORAL_TABLET | Freq: Every day | ORAL | Status: DC
  Administered 2018-09-10: 1000 [IU] via ORAL
  Filled 2018-09-08 (×5): qty 1

## 2018-09-08 MED ORDER — SODIUM CHLORIDE 0.9% FLUSH: 3.0000 mL | INTRAVENOUS | Status: DC | PRN

## 2018-09-08 MED ORDER — ACETAMINOPHEN 325 MG PO TABS
975.0000 mg | ORAL_TABLET | Freq: Three times a day (TID) | ORAL | Status: DC | PRN
  Filled 2018-09-08: qty 3

## 2018-09-08 MED ORDER — ACETAMINOPHEN 650 MG RE SUPP: 650.0000 mg | Freq: Four times a day (QID) | RECTAL | Status: DC | PRN

## 2018-09-08 MED ORDER — VANCOMYCIN HCL 10 G IV SOLR: 1500.0000 mg | INTRAVENOUS | Status: DC

## 2018-09-08 MED ORDER — ONDANSETRON HCL 4 MG/2ML IJ SOLN
4.0000 mg | Freq: Four times a day (QID) | INTRAMUSCULAR | Status: DC | PRN
  Administered 2018-09-09: 4 mg via INTRAVENOUS
  Filled 2018-09-08: qty 2

## 2018-09-08 MED ORDER — TIOTROPIUM BROMIDE MONOHYDRATE 18 MCG IN CAPS
18.0000 ug | ORAL_CAPSULE | Freq: Every day | RESPIRATORY_TRACT | Status: DC
  Filled 2018-09-08: qty 5

## 2018-09-08 MED ORDER — FUROSEMIDE 10 MG/ML IJ SOLN
40.0000 mg | Freq: Once | INTRAMUSCULAR | Status: AC
  Administered 2018-09-08: 40 mg via INTRAVENOUS
  Filled 2018-09-08: qty 4

## 2018-09-08 MED ORDER — SODIUM CHLORIDE 0.9 % IV SOLN
250.0000 mL | INTRAVENOUS | Status: DC | PRN
  Administered 2018-09-10: 250 mL via INTRAVENOUS

## 2018-09-08 MED ORDER — SODIUM CHLORIDE 0.9% FLUSH
3.0000 mL | Freq: Two times a day (BID) | INTRAVENOUS | Status: DC
  Administered 2018-09-08 – 2018-09-10 (×6): 3 mL via INTRAVENOUS

## 2018-09-08 MED ORDER — MOMETASONE FURO-FORMOTEROL FUM 200-5 MCG/ACT IN AERO
2.0000 | INHALATION_SPRAY | Freq: Two times a day (BID) | RESPIRATORY_TRACT | Status: DC
  Administered 2018-09-08 – 2018-09-10 (×3): 2 via RESPIRATORY_TRACT
  Filled 2018-09-08: qty 8.8

## 2018-09-08 MED ORDER — AMLODIPINE BESYLATE 5 MG PO TABS
5.0000 mg | ORAL_TABLET | Freq: Every day | ORAL | Status: DC
  Administered 2018-09-08: 5 mg via ORAL
  Filled 2018-09-08: qty 1

## 2018-09-08 MED ORDER — SODIUM CHLORIDE 0.9 % IV SOLN
1.0000 g | Freq: Once | INTRAVENOUS | Status: AC
  Administered 2018-09-08: 1 g via INTRAVENOUS
  Filled 2018-09-08: qty 1

## 2018-09-08 MED ORDER — SODIUM CHLORIDE 0.9 % IV SOLN
1.0000 g | Freq: Two times a day (BID) | INTRAVENOUS | Status: DC
  Filled 2018-09-08 (×2): qty 1

## 2018-09-08 MED ORDER — VANCOMYCIN HCL 10 G IV SOLR
1750.0000 mg | Freq: Once | INTRAVENOUS | Status: DC
  Filled 2018-09-08: qty 1750

## 2018-09-08 MED ORDER — LEVOTHYROXINE SODIUM 137 MCG PO TABS
137.0000 ug | ORAL_TABLET | Freq: Every day | ORAL | Status: DC
  Administered 2018-09-10: 137 ug via ORAL
  Filled 2018-09-08 (×3): qty 1

## 2018-09-08 MED ORDER — VITAMIN B-12 1000 MCG PO TABS
1000.0000 ug | ORAL_TABLET | Freq: Every day | ORAL | Status: DC
  Administered 2018-09-10: 1000 ug via ORAL
  Filled 2018-09-08: qty 1

## 2018-09-08 MED ORDER — ASPIRIN EC 81 MG PO TBEC
81.0000 mg | DELAYED_RELEASE_TABLET | Freq: Every day | ORAL | Status: DC
  Administered 2018-09-08 – 2018-09-10 (×2): 81 mg via ORAL
  Filled 2018-09-08 (×2): qty 1

## 2018-09-08 MED ORDER — ACETAMINOPHEN 325 MG PO TABS
650.0000 mg | ORAL_TABLET | Freq: Four times a day (QID) | ORAL | Status: DC | PRN
  Administered 2018-09-10: 650 mg via ORAL

## 2018-09-08 MED ORDER — SODIUM CHLORIDE 0.9 % IV SOLN
1.0000 g | INTRAVENOUS | Status: DC
  Administered 2018-09-08 – 2018-09-10 (×3): 1 g via INTRAVENOUS
  Filled 2018-09-08 (×3): qty 1

## 2018-09-08 MED ORDER — ONDANSETRON HCL 4 MG PO TABS: 4.0000 mg | ORAL_TABLET | Freq: Four times a day (QID) | ORAL | Status: DC | PRN

## 2018-09-08 MED ORDER — ADULT MULTIVITAMIN W/MINERALS CH
1.0000 | ORAL_TABLET | Freq: Every day | ORAL | Status: DC
  Administered 2018-09-10: 1 via ORAL
  Filled 2018-09-08: qty 1

## 2018-09-08 MED ORDER — HEPARIN BOLUS VIA INFUSION
4000.0000 [IU] | Freq: Once | INTRAVENOUS | Status: AC
  Administered 2018-09-08: 4000 [IU] via INTRAVENOUS
  Filled 2018-09-08: qty 4000

## 2018-09-08 MED ORDER — SODIUM CHLORIDE 0.9 % IV SOLN
500.0000 mg | INTRAVENOUS | Status: DC
  Administered 2018-09-08 – 2018-09-10 (×3): 500 mg via INTRAVENOUS
  Filled 2018-09-08 (×3): qty 500

## 2018-09-08 MED ORDER — HEPARIN (PORCINE) 25000 UT/250ML-% IV SOLN
1000.0000 [IU]/h | INTRAVENOUS | Status: DC
  Administered 2018-09-08 – 2018-09-10 (×3): 1000 [IU]/h via INTRAVENOUS
  Filled 2018-09-08 (×3): qty 250

## 2018-09-08 MED ORDER — TAMSULOSIN HCL 0.4 MG PO CAPS
0.4000 mg | ORAL_CAPSULE | Freq: Every day | ORAL | Status: DC
  Administered 2018-09-08 – 2018-09-10 (×3): 0.4 mg via ORAL
  Filled 2018-09-08 (×3): qty 1

## 2018-09-08 NOTE — ED Triage Notes (Signed)
Pt presents via EMS from [facility] c/o SOB. S/p Pacemaker yesterday per report. Reports fever last PM per EMS.

## 2018-09-08 NOTE — ED Notes (Signed)
ED TO INPATIENT HANDOFF REPORT  ED Nurse Name and Phone #: Yong Channel RN [phone]  S Name/Age/Gender Paul Cross 85 y.o. male Room/Bed: ED07A/ED07A  Code Status   Code Status: Prior  Home/SNF/Other Rehab Patient oriented to: self, place, time and situation Is this baseline? Yes   Triage Complete: Triage complete  Chief Complaint low oxygen  Triage Note Pt presents via EMS from [facility] c/o SOB. S/p Pacemaker yesterday per report. Reports fever last PM per EMS.    Allergies Allergies  Allergen Reactions  . Diphenhydramine Hcl Other (See Comments)    Reaction:  Unknown   . Simvastatin Other (See Comments)    Reaction:  Unknown     Level of Care/Admitting Diagnosis ED Disposition    ED Disposition Condition [facility] Area: [place] [[id number]]  Level of Care: Stepdown [14]  Diagnosis: Acute respiratory failure ([place]) [518.81.ICD-9-CM]  Admitting Physician: Dustin Flock [[id number]]  Attending Physician: Dustin Flock [[id number]]  Estimated length of stay: past midnight tomorrow  Certification:: I certify this patient will need inpatient services for at least 2 midnights  PT Class (Do Not Modify): Inpatient [101]  PT Acc Code (Do Not Modify): Private [1]       B Medical/Surgery History Past Medical History:  Diagnosis Date  . Hyperlipidemia   . Hypertension   . Hypothyroidism    approximately 2009   Past Surgical History:  Procedure Laterality Date  . CHOLECYSTECTOMY N/A 04/16/2015   Procedure: LAPAROSCOPIC CHOLECYSTECTOMY;  Surgeon: Clayburn Pert, MD;  Location: [facility] ORS;  Service: General;  Laterality: N/A;  . FEMORAL ARTERY REPAIR    . PACEMAKER IMPLANT N/A 09/06/2018   Procedure: PACEMAKER IMPLANT;  Surgeon: Constance Haw, MD;  Location: [place] [facility];  Service: Cardiovascular;  Laterality: N/A;     A IV Location/Drains/Wounds Patient Lines/Drains/Airways Status   Active Line/Drains/Airways     Name:   Placement date:   Placement time:   Site:   Days:   Peripheral IV 08/19/2018 Right Antecubital   09/16/2018    1315    Antecubital   less than 1   Incision (Closed) 04/16/15 Abdomen Other (Comment)   04/16/15    1200     1241   Incision - 4 Ports Abdomen 1: Upper;Mid 2: Umbilicus 3: Left;Upper 4: Left;Lower   04/16/15    -     1241          Intake/Output Last 24 hours No intake or output data in the 24 hours ending 09/06/2018 1418  Labs/Imaging Results for orders placed or performed during the hospital encounter of 08/24/2018 (from the past 48 hour(s))  Comprehensive metabolic panel     Status: Abnormal   Collection Time: 08/20/2018 12:32 PM  Result Value Ref Range   Sodium 136 135 - 145 mmol/L   Potassium 5.4 (H) 3.5 - 5.1 mmol/L   Chloride 101 98 - 111 mmol/L   CO2 24 22 - 32 mmol/L   Glucose, Bld 163 (H) 70 - 99 mg/dL   BUN 42 (H) 8 - 23 mg/dL   Creatinine, Ser 1.83 (H) 0.61 - 1.24 mg/dL   Calcium 9.5 8.9 - 10.3 mg/dL   Total Protein 7.6 6.5 - 8.1 g/dL   Albumin 4.0 3.5 - 5.0 g/dL   AST 236 (H) 15 - 41 U/L   ALT 102 (H) 0 - 44 U/L   Alkaline Phosphatase 44 38 - 126 U/L   Total Bilirubin 2.2 (H)  0.3 - 1.2 mg/dL   GFR calc non Af Amer 30 (L) >60 mL/min   GFR calc Af Amer 35 (L) >60 mL/min   Anion gap 11 5 - 15    Comment: Performed at [facility], [address] [place], [place] [address]  CBC with Differential     Status: Abnormal   Collection Time: 09/02/2018 12:32 PM  Result Value Ref Range   WBC 13.3 (H) 4.0 - 10.5 K/uL   RBC 4.10 (L) 4.22 - 5.81 MIL/uL   Hemoglobin 13.3 13.0 - 17.0 g/dL   HCT 40.5 39.0 - 52.0 %   MCV 98.8 80.0 - 100.0 fL   MCH 32.4 26.0 - 34.0 pg   MCHC 32.8 30.0 - 36.0 g/dL   RDW 13.2 11.5 - 15.5 %   Platelets 168 150 - 400 K/uL   nRBC 0.2 0.0 - 0.2 %   Neutrophils Relative % 78 %   Neutro Abs 10.4 (H) 1.7 - 7.7 K/uL   Lymphocytes Relative 9 %   Lymphs Abs 1.2 0.7 - 4.0 K/uL   Monocytes Relative 10 %   Monocytes Absolute 1.3 (H)  0.1 - 1.0 K/uL   Eosinophils Relative 2 %   Eosinophils Absolute 0.2 0.0 - 0.5 K/uL   Basophils Relative 0 %   Basophils Absolute 0.0 0.0 - 0.1 K/uL   Immature Granulocytes 1 %   Abs Immature Granulocytes 0.15 (H) 0.00 - 0.07 K/uL    Comment: Performed at [facility], [address] [place], [place] [address]  Troponin I - Once     Status: Abnormal   Collection Time: 08/22/2018 12:32 PM  Result Value Ref Range   Troponin I 17.44 (HH) <0.03 ng/mL    Comment: CRITICAL RESULT CALLED TO, READ BACK BY AND VERIFIED WITH Thorn Demas @1308  08/28/2018 AKT Performed at [facility], [address] [place], [place] [address]   Brain natriuretic peptide     Status: Abnormal   Collection Time: 08/26/2018 12:32 PM  Result Value Ref Range   B Natriuretic Peptide 1,535.0 (H) 0.0 - 100.0 pg/mL    Comment: Performed at [facility], [address], [place], [place] [address]   Dg Chest 2 View  Result Date: 09/07/2018 CLINICAL DATA:  Pacemaker. EXAM: CHEST - 2 VIEW COMPARISON:  No recent prior. FINDINGS: Cardiac pacer lead tip over the right atrium and right ventricle. Cardiomegaly. Diffuse bilateral from interstitial prominence. Interstitial changes could be chronic however an acute process including interstitial edema and/or pneumonitis could present in this fashion. No pleural effusion or pneumothorax. Diffuse thoracic spine osteopenia degenerative change. IMPRESSION: Cardiac pacer with lead tip over the right atrium and right ventricle. Cardiomegaly. Diffuse bilateral pulmonary interstitial prominence. Interstitial changes could be from chronic interstitial disease however an acute process including interstitial edema and/or pneumonitis could present this fashion. No pneumothorax. Electronically Signed   By: Marcello Moores  Register   On: 09/07/2018 06:25   Dg Chest Portable 1 View  Result Date: 09/13/2018 CLINICAL DATA:  Fever last night. Shortness of breath. Patient status post  pacemaker placement 09/06/2018. EXAM: PORTABLE CHEST 1 VIEW COMPARISON:  Single-view of the chest 09/06/2018 and 01/01/2018. CT chest 03/20/2018. FINDINGS: Changes of interstitial lung disease are again seen. There is new airspace disease in the right upper lobe. Heart size is normal. Pacing device is in place with leads projecting in the right atrium and right ventricle. No pneumothorax or pleural effusion. IMPRESSION: Increased airspace opacity in the right upper lobe  most worrisome for pneumonia superimposed on chronic interstitial lung disease. Electronically Signed   By: Inge Rise M.D.   On: 09/10/2018 13:13    Pending Labs Unresulted Labs (From admission, onward)    Start     Ordered   08/19/2018 1414  Troponin I - Now Then Q6H  Now then every 6 hours,   STAT     09/14/2018 1413   09/07/2018 1405  Procalcitonin - Baseline  ONCE - STAT,   STAT     09/13/2018 1404   08/24/2018 1344  Urine culture  ONCE - STAT,   STAT     08/24/2018 1343   08/22/2018 1343  Urinalysis, Complete w Microscopic  ONCE - STAT,   STAT     09/12/2018 1343   09/10/2018 1341  Culture, blood (routine x 2)  BLOOD CULTURE X 2,   STAT     09/10/2018 1341   Signed and Held  CBC  Tomorrow morning,   R     Signed and Held   Signed and Held  Basic metabolic panel  Tomorrow morning,   R     Signed and Held          Vitals/Pain Today's Vitals   09/06/2018 1300 08/24/2018 1315 08/25/2018 1352 09/10/2018 1400  BP: 112/85   107/70  Pulse:   84 83  Resp:   (!) 31   Temp:  98 F (36.7 C)    TempSrc:  Rectal    SpO2:   96% 95%  PainSc:        Isolation Precautions No active isolations  Medications Medications  ceFEPIme (MAXIPIME) 1 g in sodium chloride 0.9 % 100 mL IVPB (1 g Intravenous New Bag/Given 09/04/2018 1415)  furosemide (LASIX) injection 40 mg (has no administration in time range)    Mobility walks with device High fall risk   Focused Assessments Cardiac Assessment Handoff:    Lab Results  Component Value Date    TROPONINI 17.44 ([place]) 09/13/2018   No results found for: DDIMER Does the Patient currently have chest pain? No     R Recommendations: See Admitting Provider Note  Report given to:   Additional Notes: na

## 2018-09-08 NOTE — Consult Note (Addendum)
Cardiology Consultation:   [name] MRN: [id number]; DOB: 1918-06-06  Admit date: 09/10/2018 Date of Consult: 09/05/2018  Primary Care Provider: Leone Haven, MD Primary Cardiologist: No primary care provider on file.  Physician requesting consult: Dr. Greig Right Reason for Consult:  Acute respiratory distress with hypoxia, high grade AV block, s/p pacer    Patient Profile:   Paul Cross is a 85 y.o. male with a hx of ILD, hypertension, hyperlipidemia, pulmonary hypertension, reported COPD versus interstitial lung disease, prior tobacco abuse and hypothyroidism , high-grade AV block with syncope, [facility] after recent discharge from [facility] in [place] yesterday with worsening shortness of breath, hypoxia   History of Present Illness:   Mr. Cirelli had recent pacemaker for high-grade AV block, was discharged to [facility] yesterday.  Family at the bedside reports that he started having slight shortness of breath with a cough prior to discharge Symptoms seem to get worse Had a very bad night per the family, agitated, trying to crawl out of bed Very short of breath this morning Decision made to send him to the emergency room  Saturations in the 16s, Was started on BiPAP for respiratory support Transferred to the ICU, weaned initially to nasal cannula 5-6 L but saturations in the high 80s Has been changed to high flow oxygen  He denies any symptoms, feels close to normal per the patient No chest pain, no arm pain jaw pain neck pain No near syncope Reports his stitches from the pacemaker sting a little bit but otherwise feels fine  Daughter at the bedside reports that past day or 2 he has not been eating or drinking well  Daughter reports that he typically uses 2 to 3 L at home nasal cannula oxygen on a regular basis Supplemental oxygen seem to increase summer 2019 Concern by pulmonary in [place]  concern for exposure to chemicals, used to work in a funeral home He does have prior smoking history  Markedly elevated troponin on arrival to the emergency room of 17, repeat up to 28 BNP 1500 Creatinine above his baseline at 1.8 BUN 42 potassium 5.4  Chest x-ray concerning for infiltrate right upper lobe Chronic interstitial lung disease   Past Medical History:  Diagnosis Date   Hyperlipidemia    Hypertension    Hypothyroidism    approximately 2009   ILD (interstitial lung disease) ([place])     Past Surgical History:  Procedure Laterality Date   CHOLECYSTECTOMY N/A 04/16/2015   Procedure: LAPAROSCOPIC CHOLECYSTECTOMY;  Surgeon: Clayburn Pert, MD;  Location: [facility];  Service: General;  Laterality: N/A;   FEMORAL ARTERY REPAIR     PACEMAKER IMPLANT N/A 09/06/2018   Procedure: PACEMAKER IMPLANT;  Surgeon: Constance Haw, MD;  Location: [facility];  Service: Cardiovascular;  Laterality: N/A;     Home Medications:  Prior to Admission medications   Medication Sig Start Date End Date Taking? Authorizing Provider  acetaminophen (TYLENOL) 325 MG tablet Take 975 mg by mouth 3 (three) times daily as needed for mild pain, moderate pain, fever or headache.   Yes [provider]  amLODipine (NORVASC) 10 MG tablet Take 5 mg by mouth daily.    Yes [provider]  aspirin EC 81 MG tablet Take 81 mg by mouth daily.   Yes [provider]  budesonide-formoterol (SYMBICORT) 160-4.5 MCG/ACT inhaler Inhale 2 puffs into the lungs 2 (two) times daily.   Yes [provider]  cholecalciferol (  VITAMIN D) 1000 units tablet Take 1,000 Units by mouth daily.   Yes [provider]  hydrocortisone cream 1 % Apply 1 application topically 2 (two) times daily. FOR TAILBONE   Yes [provider]  levothyroxine (SYNTHROID, LEVOTHROID) 137 MCG tablet Take 137 mcg by mouth daily before breakfast.    Yes [provider]  Multiple  Vitamin (MULTIVITAMIN) capsule Take 1 capsule by mouth daily.   Yes [provider]  tamsulosin (FLOMAX) 0.4 MG CAPS capsule Take 1 capsule (0.4 mg total) by mouth at bedtime. 09/07/18  Yes Baldwin Jamaica, PA-C  tiotropium (SPIRIVA HANDIHALER) 18 MCG inhalation capsule Place 1 capsule (18 mcg total) into inhaler and inhale daily. 02/01/18 02/01/19 Yes Young, Clinton D, MD  Tiotropium Bromide Monohydrate (SPIRIVA RESPIMAT) 2.5 MCG/ACT AERS Inhale 2 puffs into the lungs daily. 03/28/18  Yes Juanito Doom, MD  vitamin B-12 (CYANOCOBALAMIN) 1000 MCG tablet Take 1,000 mcg by mouth daily.   Yes [provider]    Inpatient Medications: Scheduled Meds:  amLODipine  5 mg Oral Daily   aspirin EC  81 mg Oral Daily   cholecalciferol  1,000 Units Oral Daily   heparin  4,000 Units Intravenous Once   [START ON 09/09/2018] levothyroxine  137 mcg Oral QAC breakfast   mometasone-formoterol  2 puff Inhalation BID   multivitamin with minerals  1 tablet Oral Daily   sodium chloride flush  3 mL Intravenous Q12H   tamsulosin  0.4 mg Oral QHS   tiotropium  18 mcg Inhalation Daily   vitamin B-12  1,000 mcg Oral Daily   Continuous Infusions:  sodium chloride     ceFEPime (MAXIPIME) IV     heparin     [START ON 09/10/2018] vancomycin     vancomycin     PRN Meds: sodium chloride, acetaminophen **OR** acetaminophen, acetaminophen, ondansetron **OR** ondansetron (ZOFRAN) IV, sodium chloride flush  Allergies:    Allergies  Allergen Reactions   Diphenhydramine Hcl Other (See Comments)    Reaction:  Unknown    Simvastatin Other (See Comments)    Reaction:  Unknown     Social History:   Social History   Socioeconomic History   Marital status: Widowed    Spouse name: Not on file   Number of children: Not on file   Years of education: Not on file   Highest education level: Not on file  Occupational History   Not on file  Social Needs   Financial resource  strain: Not on file   Food insecurity:    Worry: Not on file    Inability: Not on file   Transportation needs:    Medical: Not on file    Non-medical: Not on file  Tobacco Use   Smoking status: Former Smoker   Smokeless tobacco: Former Systems developer  Substance and Sexual Activity   Alcohol use: No   Drug use: No   Sexual activity: Not on file  Lifestyle   Physical activity:    Days per week: Not on file    Minutes per session: Not on file   Stress: Not on file  Relationships   Social connections:    Talks on phone: Not on file    Gets together: Not on file    Attends religious service: Not on file    Active member of club or organization: Not on file    Attends meetings of clubs or organizations: Not on file    Relationship status: Not on file  Intimate partner violence:    Fear of current or ex partner: Not on file    Emotionally abused: Not on file    Physically abused: Not on file    Forced sexual activity: Not on file  Other Topics Concern   Not on file  Social History Narrative   Not on file    Family History:    Family History  Problem Relation Age of Onset   Cancer Sister        breast   Diabetes Brother    Heart disease Brother    Stroke Sister    Depression Daughter      ROS:  Please see the history of present illness.  Review of Systems  Constitutional: Negative.   Respiratory: Positive for cough and shortness of breath.   Cardiovascular: Negative.   Gastrointestinal: Negative.   Musculoskeletal: Negative.   Neurological: Negative.   Psychiatric/Behavioral: Negative.   All other systems reviewed and are negative.     Physical Exam/Data:   Vitals:   08/24/2018 1418 09/10/2018 1419 09/01/2018 1420 08/24/2018 1514  BP:    126/78  Pulse: 82 84 90   Resp:    (!) 27  Temp:    97.8 F (36.6 C)  TempSrc:    Axillary  SpO2: 95% 97% 94% 92%  Weight:    86 kg  Height:    6' (1.829 m)   No intake or output data in the 24 hours ending  08/26/2018 1616 Last 3 Weights 09/02/2018 09/07/2018 09/06/2018  Weight (lbs) 189 lb 9.5 oz 192 lb 187 lb 3.2 oz  Weight (kg) 86 kg 87.091 kg 84.913 kg     Body mass index is 25.71 kg/m.  General:  Well nourished, well developed, moderate respiratory distress on high flow nasal cannula HEENT: normal Lymph: no adenopathy Neck: no JVD Endocrine:  No thryomegaly Vascular: No carotid bruits; FA pulses 2+ bilaterally without bruits  Cardiac:  normal S1, S2; RRR; no murmur  Lungs: Coarse breath sounds, bilateral rhonchi Abd: soft, nontender, no hepatomegaly  Ext: no edema Musculoskeletal:  No deformities, BUE and BLE strength normal and equal Skin: warm and dry  Neuro:  CNs 2-12 intact, no focal abnormalities noted Psych:  confusion  EKG:  The EKG was personally reviewed and demonstrates: Sinus rhythm atrial sensed V paced rhythm 87 bpm Telemetry:  Telemetry was personally reviewed and demonstrates: Normal sinus rhythm, paced  Relevant CV Studies: Echo pending  Laboratory Data:  Chemistry Recent Labs  Lab 09/06/18 1011 09/07/18 0753 09/07/2018 1232  NA 136 140 136  K 5.0 4.7 5.4*  CL 104 105 101  CO2 24 25 24   GLUCOSE 121* 109* 163*  BUN 47* 31* 42*  CREATININE 1.61* 1.33* 1.83*  CALCIUM 9.5 9.9 9.5  GFRNONAA 35* 44* 30*  GFRAA 41* 51* 35*  ANIONGAP 8 10 11     Recent Labs  Lab 09/06/18 1011 09/07/2018 1232  PROT 6.8 7.6  ALBUMIN 3.9 4.0  AST 18 236*  ALT 16 102*  ALKPHOS 35* 44  BILITOT 0.9 2.2*   Hematology Recent Labs  Lab 09/06/18 1011 08/27/2018 1232  WBC 7.2 13.3*  RBC 3.82* 4.10*  HGB 12.6* 13.3  HCT 38.3* 40.5  MCV 100.3* 98.8  MCH 33.0 32.4  MCHC 32.9 32.8  RDW 13.2 13.2  PLT 144* 168   Cardiac Enzymes Recent Labs  Lab 09/06/18 1011 09/03/2018 1232 08/22/2018 1410  TROPONINI 0.03* 17.44* 28.59*   No results for input(s): TROPIPOC in the  last 168 hours.  BNP Recent Labs  Lab 09/16/2018 1232  BNP 1,535.0*    DDimer No results for input(s): DDIMER  in the last 168 hours.  Radiology/Studies:  Dg Chest 2 View  Result Date: 09/07/2018 CLINICAL DATA:  Pacemaker. EXAM: CHEST - 2 VIEW COMPARISON:  No recent prior. FINDINGS: Cardiac pacer lead tip over the right atrium and right ventricle. Cardiomegaly. Diffuse bilateral from interstitial prominence. Interstitial changes could be chronic however an acute process including interstitial edema and/or pneumonitis could present in this fashion. No pleural effusion or pneumothorax. Diffuse thoracic spine osteopenia degenerative change. IMPRESSION: Cardiac pacer with lead tip over the right atrium and right ventricle. Cardiomegaly. Diffuse bilateral pulmonary interstitial prominence. Interstitial changes could be from chronic interstitial disease however an acute process including interstitial edema and/or pneumonitis could present this fashion. No pneumothorax. Electronically Signed   By: Marcello Moores  Register   On: 09/07/2018 06:25   Ct Head Wo Contrast  Result Date: 09/06/2018 CLINICAL DATA:  Dizziness today and yesterday. Bradycardia today. Status post fall yesterday. Initial encounter. EXAM: CT HEAD WITHOUT CONTRAST CT CERVICAL SPINE WITHOUT CONTRAST TECHNIQUE: Multidetector CT imaging of the head and cervical spine was performed following the standard protocol without intravenous contrast. Multiplanar CT image reconstructions of the cervical spine were also generated. COMPARISON:  Brain MRI and head CT 04/18/2017. FINDINGS: CT HEAD FINDINGS Brain: No evidence of acute infarction, hemorrhage, hydrocephalus, extra-axial collection or mass lesion/mass effect. Atrophy and chronic microvascular ischemic change are noted. Vascular: Atherosclerosis is identified. Skull: Intact.  No focal lesion. Sinuses/Orbits: Negative. Other: None. CT CERVICAL SPINE FINDINGS Alignment: There is reversal of the normal cervical lordosis. Mild facet mediated anterolisthesis C3 on C4 and C4 on C5 noted. Skull base and vertebrae: No acute  fracture. No primary bone lesion or focal pathologic process. Soft tissues and spinal canal: No prevertebral fluid or swelling. No visible canal hematoma. Disc levels: Loss of disc space height is worst at C5-6, C6-7 and C7-T1. Scattered facet arthropathy noted. Upper chest: Apical scar is present. Other: Atherosclerosis noted. IMPRESSION: No acute abnormality head or cervical spine. Atrophy and chronic microvascular ischemic change. Atherosclerosis. Cervical spondylosis. Electronically Signed   By: Inge Rise M.D.   On: 09/06/2018 11:31   Ct Cervical Spine Wo Contrast  Result Date: 09/06/2018 CLINICAL DATA:  Dizziness today and yesterday. Bradycardia today. Status post fall yesterday. Initial encounter. EXAM: CT HEAD WITHOUT CONTRAST CT CERVICAL SPINE WITHOUT CONTRAST TECHNIQUE: Multidetector CT imaging of the head and cervical spine was performed following the standard protocol without intravenous contrast. Multiplanar CT image reconstructions of the cervical spine were also generated. COMPARISON:  Brain MRI and head CT 04/18/2017. FINDINGS: CT HEAD FINDINGS Brain: No evidence of acute infarction, hemorrhage, hydrocephalus, extra-axial collection or mass lesion/mass effect. Atrophy and chronic microvascular ischemic change are noted. Vascular: Atherosclerosis is identified. Skull: Intact.  No focal lesion. Sinuses/Orbits: Negative. Other: None. CT CERVICAL SPINE FINDINGS Alignment: There is reversal of the normal cervical lordosis. Mild facet mediated anterolisthesis C3 on C4 and C4 on C5 noted. Skull base and vertebrae: No acute fracture. No primary bone lesion or focal pathologic process. Soft tissues and spinal canal: No prevertebral fluid or swelling. No visible canal hematoma. Disc levels: Loss of disc space height is worst at C5-6, C6-7 and C7-T1. Scattered facet arthropathy noted. Upper chest: Apical scar is present. Other: Atherosclerosis noted. IMPRESSION: No acute abnormality head or cervical  spine. Atrophy and chronic microvascular ischemic change. Atherosclerosis. Cervical spondylosis. Electronically Signed  By: Inge Rise M.D.   On: 09/06/2018 11:31   Dg Chest Portable 1 View  Result Date: 09/15/2018 CLINICAL DATA:  Fever last night. Shortness of breath. Patient status post pacemaker placement 09/06/2018. EXAM: PORTABLE CHEST 1 VIEW COMPARISON:  Single-view of the chest 09/06/2018 and 01/01/2018. CT chest 03/20/2018. FINDINGS: Changes of interstitial lung disease are again seen. There is new airspace disease in the right upper lobe. Heart size is normal. Pacing device is in place with leads projecting in the right atrium and right ventricle. No pneumothorax or pleural effusion. IMPRESSION: Increased airspace opacity in the right upper lobe most worrisome for pneumonia superimposed on chronic interstitial lung disease. Electronically Signed   By: Inge Rise M.D.   On: 09/02/2018 13:13   Dg Chest Portable 1 View  Result Date: 09/06/2018 CLINICAL DATA:  Onset dizziness yesterday.  Status post fall. EXAM: PORTABLE CHEST 1 VIEW COMPARISON:  CT chest 03/20/2018. Single-view of the chest 01/15/2018 and 01/01/2018. FINDINGS: Changes of chronic interstitial lung disease are again seen. No superimposed pneumonia or pulmonary edema. Heart size is upper normal. Atherosclerosis noted. No pneumothorax or pleural effusion. Defibrillator pads are in place. No acute bony abnormality. IMPRESSION: No acute abnormality. Chronic interstitial lung disease. Atherosclerosis. Electronically Signed   By: Inge Rise M.D.   On: 09/06/2018 10:42    Assessment and Plan:   1. Non-STEMI Risk factors include age, prior smoking history Prior CT scan October 2019 with left main and three-vessel coronary disease Currently asymptomatic EKG with no acute findings, paced rhythm -Discussed with ICU team. Consider heparin infusion -Could consider CT angiography if able to lay flat.  Currently is  inclined in bed on high flow nasal cannula -Echocardiogram pending -Poor candidate for cardiac catheterization in his current state given his underlying lung disease, hypoxia, age  50) acute on chronic respiratory distress Required BiPAP on arrival now high flow nasal cannula Pre-existing fibrotic lung disease seen on recent CT scan October 2019, followed by pulmonary -Unable to exclude hospital-acquired pneumonia Started on broad-spectrum antibiotics Elevated white count, Less likely heart failure symptoms given poor p.o. intake past 2 days -Certainly could be exacerbated by acute MI.  Echocardiogram pending  3) high-grade AV block, pacer Placed recently at [facility] with discharge yesterday to facility, rehab Appears to be pacing appropriately on EKG Echocardiogram pending He is not hypotensive  4) acute on chronic renal failure Mixed picture, family reports poor fluid intake past 2 days, appears prerenal  BNP elevated  -Possibly ATN Would hold any fluids or diuretics at this time, Echocardiogram pending  5) confusion Etiology unclear, possibly sundowning Urinalysis and culture pending Blood cultures pending Possibly exacerbated by recent hospitalization and hypoxia  Long discussion with patient's family at the bedside Discussed each issue and recent hospital course that [facility] in [place] Discussed additional testing may be needed Case discussed with respiratory  Total encounter time more than 110 minutes  Greater than 50% was spent in counseling and coordination of care with the patient   For questions or updates, please contact [place] Please consult [email or web address] for contact info under     Signed, Ida Rogue, MD  08/20/2018 4:16 PM

## 2018-09-08 NOTE — Progress Notes (Signed)
Pharmacy Antibiotic Note  Paul Cross is a 85 y.o. male admitted on 09/12/2018 with pneumonia.  Pharmacy has been consulted for Vancomycin and Cefepime dosing.  Plan: Vancomycin 1750mg  IV once loading dose  Vancomycin 1500 mg IV Q 48 hrs. Goal AUC 400-550. Expected AUC: 479.7 SCr used: 1.83  Cefepime 1g IV q12h, renally adjusted    Temp (24hrs), Avg:98 F (36.7 C), Min:97.9 F (36.6 C), Max:98 F (36.7 C)  Recent Labs  Lab 09/06/18 1011 09/07/18 0753 08/20/2018 1232  WBC 7.2  --  13.3*  CREATININE 1.61* 1.33* 1.83*    Estimated Creatinine Clearance: 24.7 mL/min (A) (by C-G formula based on SCr of 1.83 mg/dL (H)).    Allergies  Allergen Reactions  . Diphenhydramine Hcl Other (See Comments)    Reaction:  Unknown   . Simvastatin Other (See Comments)    Reaction:  Unknown     Antimicrobials this admission: Vancomycin 3/21 >>  Cefepime 3/21 >>   Microbiology results: 3/21 BCx: pending 3/21 MRSA PCR: pending  Thank you for allowing pharmacy to be a part of this patient's care.  Paulina Fusi, PharmD, BCPS 08/26/2018 2:51 PM

## 2018-09-08 NOTE — Progress Notes (Signed)
Pt having challenges maintaining oxygen saturations.  With excessive movement and verbalizations his sats very between 81-91%.

## 2018-09-08 NOTE — Progress Notes (Signed)
ANTICOAGULATION CONSULT NOTE - Initial Consult  Pharmacy Consult for Heparin Drip Indication: chest pain/ACS  Allergies  Allergen Reactions  . Diphenhydramine Hcl Other (See Comments)    Reaction:  Unknown   . Simvastatin Other (See Comments)    Reaction:  Unknown     Patient Measurements:   Heparin Dosing Weight: 84.9 kg  Vital Signs: Temp: 98 F (36.7 C) (03/21 1315) Temp Source: Rectal (03/21 1315) BP: 107/70 (03/21 1400) Pulse Rate: 90 (03/21 1420)  Labs: Recent Labs    09/06/18 1011 09/07/18 0753 08/25/2018 1232  HGB 12.6*  --  13.3  HCT 38.3*  --  40.5  PLT 144*  --  168  CREATININE 1.61* 1.33* 1.83*  TROPONINI 0.03*  --  17.44*    Estimated Creatinine Clearance: 24.7 mL/min (A) (by C-G formula based on SCr of 1.83 mg/dL (H)).   Medical History: Past Medical History:  Diagnosis Date  . Hyperlipidemia   . Hypertension   . Hypothyroidism    approximately 2009   Assessment: Patient is a 85yo male admitted with fever, patient had a pacemaker placed on 09/07/18. Pharmacy consulted for Heparin dosing for elevated troponin.  Goal of Therapy:  Heparin level 0.3-0.7 units/ml Monitor platelets by anticoagulation protocol: Yes   Plan:  Give 4000 units bolus x 1 Start heparin infusion at 1000 units/hr Check anti-Xa level in 8 hours and daily while on heparin Continue to monitor H&H and platelets  Paulina Fusi, PharmD, BCPS 09/10/2018 2:47 PM

## 2018-09-08 NOTE — ED Notes (Signed)
ICU to call back to receive report.

## 2018-09-08 NOTE — ED Provider Notes (Signed)
[facility] Emergency Department Provider Note   ____________________________________________   First MD Initiated Contact with Patient 09/18/2018 1228     (approximate)  I have reviewed the triage vital signs and the nursing notes.   HISTORY  Chief Complaint Shortness of Breath   HPI Paul Cross is a 85 y.o. male who recently had a pacemaker placed.  [facility] had a fever yesterday reportedly and today was short of breath.  He comes in with sats in the 80s.  Denies any cough only says he feels short of breath.        Past Medical History:  Diagnosis Date  . Hyperlipidemia   . Hypertension   . Hypothyroidism    approximately 2009  . ILD (interstitial lung disease) [facility])     Patient Active Problem List   Diagnosis Date Noted  . Acute respiratory failure ([place]) 09/13/2018  . Heart block AV complete ([place]) 09/06/2018  . AKI (acute kidney injury) ([place]) 09/06/2018  . Syncope and collapse 09/06/2018  . Complete heart block ([place]) 09/06/2018  . Second degree AV block 09/06/2018  . BPH (benign prostatic hyperplasia) 07/11/2018  . Pressure injury of skin of left buttock 07/11/2018  . Elevated glucose 02/08/2018  . Proteinuria 02/08/2018  . Anxiety and depression 02/08/2018  . Pulmonary fibrosis ([place]) 02/08/2018  . Hypertension 02/08/2018  . Hypothyroidism 02/08/2018  . Shingles 02/08/2018  . Adrenal adenoma, right 02/08/2018  . Abdominal aortic aneurysm (AAA) 3.0 cm to 5.5 cm in diameter in male ([place]) 02/08/2018  . Respiratory failure ([place]) 01/01/2018  . Aftercare following surgery 05/04/2015    Past Surgical History:  Procedure Laterality Date  . CHOLECYSTECTOMY N/A 04/16/2015   Procedure: LAPAROSCOPIC CHOLECYSTECTOMY;  Surgeon: Clayburn Pert, MD;  Location: [facility] ORS;  Service: General;  Laterality: N/A;  . FEMORAL ARTERY REPAIR    . PACEMAKER IMPLANT N/A 09/06/2018   Procedure: PACEMAKER IMPLANT;  Surgeon:  Constance Haw, MD;  Location: [place] CV LAB;  Service: Cardiovascular;  Laterality: N/A;    Prior to Admission medications   Medication Sig Start Date End Date Taking? Authorizing Provider  acetaminophen (TYLENOL) 325 MG tablet Take 975 mg by mouth 3 (three) times daily as needed for mild pain, moderate pain, fever or headache.   Yes [provider]  amLODipine (NORVASC) 10 MG tablet Take 5 mg by mouth daily.    Yes [provider]  aspirin EC 81 MG tablet Take 81 mg by mouth daily.   Yes [provider]  budesonide-formoterol (SYMBICORT) 160-4.5 MCG/ACT inhaler Inhale 2 puffs into the lungs 2 (two) times daily.   Yes [provider]  cholecalciferol (VITAMIN D) 1000 units tablet Take 1,000 Units by mouth daily.   Yes [provider]  hydrocortisone cream 1 % Apply 1 application topically 2 (two) times daily. FOR TAILBONE   Yes [provider]  levothyroxine (SYNTHROID, LEVOTHROID) 137 MCG tablet Take 137 mcg by mouth daily before breakfast.    Yes [provider]  Multiple Vitamin (MULTIVITAMIN) capsule Take 1 capsule by mouth daily.   Yes [provider]  tamsulosin (FLOMAX) 0.4 MG CAPS capsule Take 1 capsule (0.4 mg total) by mouth at bedtime. 09/07/18  Yes Baldwin Jamaica, PA-C  tiotropium (SPIRIVA HANDIHALER) 18 MCG inhalation capsule Place 1 capsule (18 mcg total) into inhaler and inhale daily. 02/01/18 02/01/19 Yes Young, Tarri Fuller D, MD  Tiotropium Bromide Monohydrate (SPIRIVA RESPIMAT) 2.5 MCG/ACT AERS Inhale 2  puffs into the lungs daily. 03/28/18  Yes Juanito Doom, MD  vitamin B-12 (CYANOCOBALAMIN) 1000 MCG tablet Take 1,000 mcg by mouth daily.   Yes [provider]    Allergies Diphenhydramine hcl and Simvastatin  Family History  Problem Relation Age of Onset  . Cancer Sister        breast  . Diabetes Brother   . Heart disease Brother   . Stroke Sister   . Depression Daughter      Social History Social History   Tobacco Use  . Smoking status: Former Research scientist (life sciences)  . Smokeless tobacco: Former Network engineer Use Topics  . Alcohol use: No  . Drug use: No    Review of Systems  Constitutional: No fever/chills Eyes: No visual changes. ENT: No sore throat. Cardiovascular: Denies chest pain. Respiratory:  shortness of breath. Gastrointestinal: No abdominal pain.  No nausea, no vomiting.  No diarrhea.  No constipation. Genitourinary: Negative for dysuria. Musculoskeletal: Negative for back pain. Skin: Negative for rash. Neurological: Negative for headaches, focal weakness    ____________________________________________   PHYSICAL EXAM:  VITAL SIGNS: ED Triage Vitals  Enc Vitals Group     BP --      Pulse Rate 09/17/2018 1233 86     Resp 09/07/2018 1233 (!) 30     Temp 09/06/2018 1233 97.9 F (36.6 C)     Temp Source 09/04/2018 1233 Axillary     SpO2 09/14/2018 1233 91 %     Weight --      Height --      Head Circumference --      Peak Flow --      Pain Score 08/30/2018 1234 0     Pain Loc --      Pain Edu? --      Excl. in [place]? --    Constitutional: Alert and oriented. Well appearing breathing hard. Eyes: Conjunctivae are normal. Head: Atraumatic. Nose: No congestion/rhinnorhea. Mouth/Throat: Mucous membranes are moist.  Oropharynx non-erythematous. Neck: No stridor.   Cardiovascular: Normal rate, regular rhythm. Grossly normal heart sounds.  Good peripheral circulation. Respiratory: Normal respiratory effort.  No retractions. Lungs CTAB. Gastrointestinal: Soft and nontender. No distention. No abdominal bruits. No CVA tenderness. Musculoskeletal: No lower extremity tenderness nor edema.  Neurologic:  Normal speech and language. No gross focal neurologic deficits are appreciated.  Skin:  Skin is warm, dry and intact. No rash noted. Psychiatric: Mood and affect are normal. Speech and behavior are normal.  ____________________________________________   LABS  (all labs ordered are listed, but only abnormal results are displayed)  Labs Reviewed  COMPREHENSIVE METABOLIC PANEL - Abnormal; Notable for the following components:      Result Value   Potassium 5.4 (*)    Glucose, Bld 163 (*)    BUN 42 (*)    Creatinine, Ser 1.83 (*)    AST 236 (*)    ALT 102 (*)    Total Bilirubin 2.2 (*)    GFR calc non Af Amer 30 (*)    GFR calc Af Amer 35 (*)    All other components within normal limits  CBC WITH DIFFERENTIAL/PLATELET - Abnormal; Notable for the following components:   WBC 13.3 (*)    RBC 4.10 (*)    Neutro Abs 10.4 (*)    Monocytes Absolute 1.3 (*)    Abs Immature Granulocytes 0.15 (*)    All other components within normal limits  TROPONIN I - Abnormal; Notable for the following components:  Troponin I 17.44 (*)    All other components within normal limits  BRAIN NATRIURETIC PEPTIDE - Abnormal; Notable for the following components:   B Natriuretic Peptide 1,535.0 (*)    All other components within normal limits  TROPONIN I - Abnormal; Notable for the following components:   Troponin I 28.59 (*)    All other components within normal limits  PROTIME-INR - Abnormal; Notable for the following components:   Prothrombin Time 15.4 (*)    All other components within normal limits  GLUCOSE, CAPILLARY - Abnormal; Notable for the following components:   Glucose-Capillary 128 (*)    All other components within normal limits  CULTURE, BLOOD (ROUTINE X 2)  CULTURE, BLOOD (ROUTINE X 2)  URINE CULTURE  MRSA PCR SCREENING  PROCALCITONIN  APTT  URINALYSIS, COMPLETE (UACMP) WITH MICROSCOPIC  TROPONIN I  TROPONIN I   ____________________________________________  EKG  EKG read interpreted by me shows atrially sensed ventricularly paced rhythm no obvious acute changes ____________________________________________  RADIOLOGY  ED MD interpretation:    Official radiology report(s): Dg Chest Portable 1 View  Result Date: 09/07/2018  CLINICAL DATA:  Fever last night. Shortness of breath. Patient status post pacemaker placement 09/06/2018. EXAM: PORTABLE CHEST 1 VIEW COMPARISON:  Single-view of the chest 09/06/2018 and 01/01/2018. CT chest 03/20/2018. FINDINGS: Changes of interstitial lung disease are again seen. There is new airspace disease in the right upper lobe. Heart size is normal. Pacing device is in place with leads projecting in the right atrium and right ventricle. No pneumothorax or pleural effusion. IMPRESSION: Increased airspace opacity in the right upper lobe most worrisome for pneumonia superimposed on chronic interstitial lung disease. Electronically Signed   By: Inge Rise M.D.   On: 09/10/2018 13:13    ____________________________________________   PROCEDURES  Procedure(s) performed (including Critical Care):  Procedures   ____________________________________________   INITIAL IMPRESSION / ASSESSMENT AND PLAN / ED COURSE Patient with elevated troponin.  He has no symptoms of chest pain or tightness.  He just had his pacemaker put in.  Additionally he is having an elevated BNP chest x-ray that looks like CHF or pneumonia.  His white count is elevated to.  I will give him some antibiotics and some Lasix.  Again with no chest pain nothing much on the EKG I am not sure what to do with the troponin however we will get him admitted and continue to repeat his troponin              ____________________________________________   FINAL CLINICAL IMPRESSION(S) / ED DIAGNOSES  Final diagnoses:  Hypoxia  Congestive heart failure, unspecified HF chronicity, unspecified heart failure type ([place])  Elevated troponin     ED Discharge Orders    None       Note:  This document was prepared using Dragon voice recognition software and may include unintentional dictation errors.    Nena Polio, MD 09/06/2018 [phone]

## 2018-09-08 NOTE — Progress Notes (Signed)
Advanced care plan.  Purpose of the Encounter: CODE STATUS  Parties in Attendance: Patient himself and daughter  Patient's Decision Capacity: Not intact but date daughter able to make decisions  Subjective/Patient's story:  Paul Cross  is a 85 y.o. male with a known history of hyperlipidemia, hypertension, hypothyroidism who was not feeling well for the past 1 week and was diagnosed with sick sinus syndrome.  Underwent a pacemaker placement at [facility] on Thursday.  Patient was discharged to rehab.  Since last night he started getting more confused according to the daughter and also short of breath.  Patient is brought to the emergency room and is noted to have pneumonia as well as possible non-ST MI.  Objective/Medical story  I discussed with the patient patient and his daughter regarding overall poor prognosis with his advanced age.  Daughter understands she states that she would not want her father to have CPR or intubation.  She does have living will and healthcare power of attorney  Goals of care determination:  DNR   CODE STATUS: DNR   Time spent discussing advanced care planning: 16 minutes

## 2018-09-08 NOTE — Consult Note (Addendum)
CRITICAL CARE NOTE      CHIEF COMPLAINT:   Fatigue and confusion x1 week and incidentally found elevated cardiac biomarkers.    HPI   This is a pleasant 85 year old male with a history of ILD and over 30 years of smoking, with chronic hypoxemia requiring 2 L/min nasal cannula which was recently increased to 4 L in the last week. He is followed by Dr. Kizzie Ide pulmonology in [place].  Also has a history of dyslipidemia essential hypertension hypothyroidism.  He is disoriented with confusion during interview and many details of recent history were provided by daughter at bedside.  Patient recently underwent permanent pacemaker insertion due to bradycardia in the 20s which was uneventful.  Daughter reports postoperative.  Has been uncomplicated with no complaints of chest pain or discomfort.  She relates that patient started to complain of unusual fatigue over the last week and some symptoms of urinary frequency concerning for UTI.  She had also noted that he be came more confused which is unusual because at baseline he has been lucid without dementia.  She denies that patient had chest pain, fevers, flulike illness, cough, diarrhea, sick contacts or travel.  In the ED patient was noted to be disoriented with elevated troponin over 17 and was admitted for acute on chronic hypoxemic respiratory failure with acute coronary syndrome non-STEMI.  PAST MEDICAL HISTORY   Past Medical History:  Diagnosis Date  . Hyperlipidemia   . Hypertension   . Hypothyroidism    approximately 2009  . ILD (interstitial lung disease) ([place])      SURGICAL HISTORY   Past Surgical History:  Procedure Laterality Date  . CHOLECYSTECTOMY N/A 04/16/2015   Procedure: LAPAROSCOPIC CHOLECYSTECTOMY;  Surgeon: Clayburn Pert, MD;  Location: [facility] ORS;   Service: General;  Laterality: N/A;  . FEMORAL ARTERY REPAIR    . PACEMAKER IMPLANT N/A 09/06/2018   Procedure: PACEMAKER IMPLANT;  Surgeon: Constance Haw, MD;  Location: [place] CV LAB;  Service: Cardiovascular;  Laterality: N/A;     FAMILY HISTORY   Family History  Problem Relation Age of Onset  . Cancer Sister        breast  . Diabetes Brother   . Heart disease Brother   . Stroke Sister   . Depression Daughter      SOCIAL HISTORY   Social History   Tobacco Use  . Smoking status: Former Research scientist (life sciences)  . Smokeless tobacco: Former Network engineer Use Topics  . Alcohol use: No  . Drug use: No     MEDICATIONS   Current Medication:  Current Facility-Administered Medications:  .  0.9 %  sodium chloride infusion, 250 mL, Intravenous, PRN, Dustin Flock, MD .  acetaminophen (TYLENOL) tablet 650 mg, 650 mg, Oral, Q6H PRN **OR** acetaminophen (TYLENOL) suppository 650 mg, 650 mg, Rectal, Q6H PRN, Dustin Flock, MD .  acetaminophen (TYLENOL) tablet 975 mg, 975 mg, Oral, TID PRN, Dustin Flock, MD .  amLODipine (NORVASC) tablet 5 mg, 5 mg, Oral, Daily, Dustin Flock, MD .  aspirin EC tablet 81 mg, 81 mg, Oral, Daily, Dustin Flock, MD .  ceFEPIme (MAXIPIME) 1 g in sodium chloride 0.9 % 100 mL IVPB, 1 g, Intravenous, Q12H, Dustin Flock, MD .  cholecalciferol (VITAMIN D) tablet 1,000 Units, 1,000 Units, Oral, Daily, Dustin Flock, MD .  heparin ADULT infusion 100 units/mL (25000 units/236m sodium chloride 0.45%), 1,000 Units/hr, Intravenous, Continuous, PDustin Flock MD .  heparin bolus via infusion 4,000 Units, 4,000 Units, Intravenous, Once, PR.R. [place]  MD .  Derrill Memo ON 09/09/2018] levothyroxine (SYNTHROID, LEVOTHROID) tablet 137 mcg, 137 mcg, Oral, QAC breakfast, Dustin Flock, MD .  mometasone-formoterol (DULERA) 200-5 MCG/ACT inhaler 2 puff, 2 puff, Inhalation, BID, Dustin Flock, MD .  multivitamin with minerals tablet 1 tablet, 1 tablet, Oral,  Daily, Dustin Flock, MD .  ondansetron (ZOFRAN) tablet 4 mg, 4 mg, Oral, Q6H PRN **OR** ondansetron (ZOFRAN) injection 4 mg, 4 mg, Intravenous, Q6H PRN, Dustin Flock, MD .  sodium chloride flush (NS) 0.9 % injection 3 mL, 3 mL, Intravenous, Q12H, Dustin Flock, MD .  sodium chloride flush (NS) 0.9 % injection 3 mL, 3 mL, Intravenous, PRN, Dustin Flock, MD .  tamsulosin (FLOMAX) capsule 0.4 mg, 0.4 mg, Oral, QHS, Dustin Flock, MD .  tiotropium (SPIRIVA) inhalation capsule ([facility] use ONLY) 18 mcg, 18 mcg, Inhalation, Daily, Dustin Flock, MD .  Derrill Memo ON 09/10/2018] vancomycin (VANCOCIN) 1,500 mg in sodium chloride 0.9 % 500 mL IVPB, 1,500 mg, Intravenous, Q48H, Dustin Flock, MD .  vancomycin (VANCOCIN) 1,750 mg in sodium chloride 0.9 % 500 mL IVPB, 1,750 mg, Intravenous, Once, Dustin Flock, MD .  vitamin B-12 (CYANOCOBALAMIN) tablet 1,000 mcg, 1,000 mcg, Oral, Daily, Dustin Flock, MD  Facility-Administered Medications Ordered in Other Encounters:  .  albuterol (PROVENTIL) (2.5 MG/3ML) 0.083% nebulizer solution 2.5 mg, 2.5 mg, Nebulization, Once, McQuaid, Douglas B, MD    ALLERGIES   Diphenhydramine hcl and Simvastatin    REVIEW OF SYSTEMS    Able to obtain due to disoriented mental status  PHYSICAL EXAMINATION   Vitals:   09/10/2018 1420 08/30/2018 1514  BP:  126/78  Pulse: 90   Resp:  (!) 27  Temp:  36.6 C  SpO2: 94% 92%    GENERAL: No apparent distress HEAD: Normocephalic, atraumatic.  EYES: Pupils equal, round, reactive to light.  No scleral icterus.  MOUTH: Moist mucosal membrane. NECK: Supple. No thyromegaly. No nodules. No JVD.  PULMONARY: Decreased breath sounds bilaterally no wheezing no rhonchi. CARDIOVASCULAR: S1 and S2. Regular rate and rhythm. No murmurs, rubs, or gallops.  GASTROINTESTINAL: Soft, nontender, non-distended. No masses. Positive bowel sounds. No hepatosplenomegaly.  MUSCULOSKELETAL: No swelling, clubbing, or edema.   NEUROLOGIC: Mild distress due to acute illness SKIN:intact,warm,dry   LABS AND IMAGING     -I personally reviewed most recent blood work, imaging and microbiology - significant findings today are hyperkalemia at 5.4, acute kidney injury with 1.83 creatinine, mild hyperglycemia, mild leukocytosis at 13.3.  LAB RESULTS: Recent Labs  Lab 09/06/18 1011 09/07/18 0753 09/01/2018 1232  NA 136 140 136  K 5.0 4.7 5.4*  CL 104 105 101  CO2 _0 BUN 47* 31* 42*  CREATININE 1.61* 1.33* 1.83*  GLUCOSE 121* 109* 163*   Recent Labs  Lab 09/06/18 1011 08/26/2018 1232  HGB 12.6* 13.3  HCT 38.3* 40.5  WBC 7.2 13.3*  PLT 144* 168     IMAGING RESULTS: Dg Chest Portable 1 View  Result Date: 08/24/2018 CLINICAL DATA:  Fever last night. Shortness of breath. Patient status post pacemaker placement 09/06/2018. EXAM: PORTABLE CHEST 1 VIEW COMPARISON:  Single-view of the chest 09/06/2018 and 01/01/2018. CT chest 03/20/2018. FINDINGS: Changes of interstitial lung disease are again seen. There is new airspace disease in the right upper lobe. Heart size is normal. Pacing device is in place with leads projecting in the right atrium and right ventricle. No pneumothorax or pleural effusion. IMPRESSION: Increased airspace opacity in the right upper lobe most worrisome for pneumonia superimposed on chronic  interstitial lung disease. Electronically Signed   By: Inge Rise M.D.   On: 09/02/2018 13:13    08/19/2018 -cxr    09/06/19-cxr   CT chest 03/2018 - advanced ILD with traction bronchiectasis     ASSESSMENT AND PLAN    -Multidisciplinary rounds held today  Acute on chronic Hypoxic Respiratory Failure - possible pneumonia with concomitant chronic ILD and traction bronchiectasis -will place on empiric community acquired pneumonia regimen -resp cx -continue BIPAP transition to [place] with goal sPo2 88-94% -continue Bronchodilator Therapy -Wean Fio2 and PEEP as tolerated -will perform  SAT/SBT when respiratory parameters are met   CARDIAC FAILURE- ACS - NSTEMI -Cardiology on case-Dr. Sidney Ace case- appreciate recommendations -oxygen as needed -follow up cardiac enzymes as indicated ICU monitoring   Renal Failure-acute kidney injury stage II -Possibly due to ATN versus prerenal azotemia secondary to decreased p.o. intake with dehydration -follow chem 7 -follow UO -continue Foley Catheter-assess need daily   NEUROLOGY -Patient is currently with altered mentation including confusion possibly secondary to dehydration versus metabolic encephalopathy versus infectious etiology    ID -continue IV abx as prescibed -follow up cultures-blood cultures obtained in ED x2 results pending, we will do a urinalysis and urine culture due to complaints of urinary frequency and fatigue in the past week  GI/Nutrition GI PROPHYLAXIS as indicated DIET-->TF's as tolerated Constipation protocol as indicated  ENDO - ICU hypoglycemic\Hyperglycemia protocol -check FSBS per protocol   ELECTROLYTES -follow labs as needed-hyperkalemia -replace as needed -pharmacy consultation   DVT/GI PRX ordered -SCDs  TRANSFUSIONS AS NEEDED MONITOR FSBS ASSESS the need for LABS as needed   Critical care provider statement:    Critical care time (minutes):  90   Critical care time was exclusive of:  Separately billable procedures and treating other patients   Critical care was necessary to treat or prevent imminent or life-threatening deterioration of the following conditions:   Altered mental status, confusion, hyperkalemia, acute coronary syndrome, non-STEMI, acute on chronic hypoxemic respiratory failure, multiple comorbid conditions.   Critical care was time spent personally by me on the following activities:  Development of treatment plan with patient or surrogate, discussions with consultants, evaluation of patient's response to treatment, examination of patient, obtaining  history from patient or surrogate, ordering and performing treatments and interventions, ordering and review of laboratory studies and re-evaluation of patient's condition.  I assumed direction of critical care for this patient from another provider in my specialty: no    This document was prepared using Dragon voice recognition software and may include unintentional dictation errors.    Ottie Glazier, M.D.  Division of [place]

## 2018-09-08 NOTE — ED Notes (Signed)
Per Cinda Quest MD, give IV Lasix s/p abx.

## 2018-09-08 NOTE — H&P (Addendum)
[place] [facility] [place] NAME: Paul Cross    MR#:  [id number]  DATE OF BIRTH:  [birth year]  DATE OF ADMISSION:  08/26/2018  PRIMARY CARE PHYSICIAN: Leone Haven, MD   REQUESTING/REFERRING PHYSICIAN: Nena Polio, MD  CHIEF COMPLAINT:   Chief Complaint  Patient presents with  . Shortness of Breath    HISTORY OF PRESENT ILLNESS: Paul Cross  is a 85 y.o. male with a known history of hyperlipidemia, hypertension, hypothyroidism who was not feeling well for the past 1 week and was diagnosed with sick sinus syndrome.  Underwent a pacemaker placement at [facility] on Thursday.  Patient was discharged to rehab.  Since last night he started getting more confused according to the daughter and also short of breath.  Patient is brought to the emergency room and is noted to have pneumonia as well as possible non-ST MI.  Patient is confused able to answer some questions currently denying any chest pain.      PAST MEDICAL HISTORY:   Past Medical History:  Diagnosis Date  . Hyperlipidemia   . Hypertension   . Hypothyroidism    approximately 2009    PAST SURGICAL HISTORY:  Past Surgical History:  Procedure Laterality Date  . CHOLECYSTECTOMY N/A 04/16/2015   Procedure: LAPAROSCOPIC CHOLECYSTECTOMY;  Surgeon: Clayburn Pert, MD;  Location: [facility] ORS;  Service: General;  Laterality: N/A;  . FEMORAL ARTERY REPAIR    . PACEMAKER IMPLANT N/A 09/06/2018   Procedure: PACEMAKER IMPLANT;  Surgeon: Constance Haw, MD;  Location: [facility];  Service: Cardiovascular;  Laterality: N/A;    SOCIAL HISTORY:  Social History   Tobacco Use  . Smoking status: Former Research scientist (life sciences)  . Smokeless tobacco: Former Network engineer Use Topics  . Alcohol use: No    FAMILY HISTORY:  Family History  Problem Relation Age of Onset  . Cancer Sister        breast  . Diabetes Brother   . Heart disease Brother   . Stroke Sister    . Depression Daughter     DRUG ALLERGIES:  Allergies  Allergen Reactions  . Diphenhydramine Hcl Other (See Comments)    Reaction:  Unknown   . Simvastatin Other (See Comments)    Reaction:  Unknown     REVIEW OF SYSTEMS:   CONSTITUTIONAL: Limited due to patient being confused MEDICATIONS AT HOME:  Prior to Admission medications   Medication Sig Start Date End Date Taking? Authorizing Provider  acetaminophen (TYLENOL) 325 MG tablet Take 975 mg by mouth 3 (three) times daily as needed for mild pain, moderate pain, fever or headache.    [provider]  amLODipine (NORVASC) 10 MG tablet Take 5 mg by mouth daily.     [provider]  aspirin EC 81 MG tablet Take 81 mg by mouth daily.    [provider]  budesonide-formoterol (SYMBICORT) 160-4.5 MCG/ACT inhaler Inhale 2 puffs into the lungs 2 (two) times daily.    [provider]  cholecalciferol (VITAMIN D) 1000 units tablet Take 1,000 Units by mouth daily.    [provider]  hydrocortisone cream 1 % Apply 1 application topically 2 (two) times daily. FOR TAILBONE    [provider]  levothyroxine (SYNTHROID, LEVOTHROID) 137 MCG tablet Take 137 mcg by mouth daily before breakfast.     [provider]  Multiple Vitamin (MULTIVITAMIN) capsule Take 1 capsule by mouth daily.    [provider]  tamsulosin (FLOMAX) 0.4 MG CAPS capsule Take 1 capsule (0.4 mg total) by mouth at bedtime. 09/07/18   Baldwin Jamaica, PA-C  tiotropium (SPIRIVA HANDIHALER) 18 MCG inhalation capsule Place 1 capsule (18 mcg total) into inhaler and inhale daily. 02/01/18 02/01/19  Baird Lyons D, MD  Tiotropium Bromide Monohydrate (SPIRIVA RESPIMAT) 2.5 MCG/ACT AERS Inhale 2 puffs into the lungs daily. 03/28/18   Juanito Doom, MD  vitamin B-12 (CYANOCOBALAMIN) 1000 MCG tablet Take 1,000 mcg by mouth daily.    [provider]      PHYSICAL EXAMINATION:   VITAL SIGNS: Pulse 86,  temperature 97.9 F (36.6 C), temperature source Axillary, resp. rate (!) 30, SpO2 91 %.  GENERAL:  85 y.o.-year-old patient lying in the bed with no acute distress.  EYES: Pupils equal, round, reactive to light and accommodation. No scleral icterus. Extraocular muscles intact.  HEENT: Head atraumatic, normocephalic. Oropharynx and nasopharynx clear.  NECK:  Supple, no jugular venous distention. No thyroid enlargement, no tenderness.  LUNGS: Crackles at the bases no accessory muscle usage CARDIOVASCULAR: S1, S2 normal. No murmurs, rubs, or gallops.  ABDOMEN: Soft, nontender, nondistended. Bowel sounds present. No organomegaly or mass.  EXTREMITIES: No pedal edema, cyanosis, or clubbing.  NEUROLOGIC: Cranial nerves II through XII are intact. Muscle strength 5/5 in all extremities. Sensation intact. Gait not checked.  PSYCHIATRIC: The patient is alert and oriented x 3.  SKIN: No obvious rash, lesion, or ulcer.   LABORATORY PANEL:   CBC Recent Labs  Lab 09/06/18 1011 08/23/2018 1232  WBC 7.2 13.3*  HGB 12.6* 13.3  HCT 38.3* 40.5  PLT 144* 168  MCV 100.3* 98.8  MCH 33.0 32.4  MCHC 32.9 32.8  RDW 13.2 13.2  LYMPHSABS 1.4 1.2  MONOABS 0.8 1.3*  EOSABS 0.1 0.2  BASOSABS 0.0 0.0   ------------------------------------------------------------------------------------------------------------------  Chemistries  Recent Labs  Lab 09/06/18 1011 09/07/18 0753 08/23/2018 1232  NA 136 140 136  K 5.0 4.7 5.4*  CL 104 105 101  CO2 24 25 24   GLUCOSE 121* 109* 163*  BUN 47* 31* 42*  CREATININE 1.61* 1.33* 1.83*  CALCIUM 9.5 9.9 9.5  AST 18  --  236*  ALT 16  --  102*  ALKPHOS 35*  --  44  BILITOT 0.9  --  2.2*   ------------------------------------------------------------------------------------------------------------------ estimated creatinine clearance is 24.7 mL/min (A) (by C-G formula based on SCr of 1.83 mg/dL  (H)). ------------------------------------------------------------------------------------------------------------------ No results for input(s): TSH, T4TOTAL, T3FREE, THYROIDAB in the last 72 hours.  Invalid input(s): FREET3   Coagulation profile No results for input(s): INR, PROTIME in the last 168 hours. ------------------------------------------------------------------------------------------------------------------- No results for input(s): DDIMER in the last 72 hours. -------------------------------------------------------------------------------------------------------------------  Cardiac Enzymes Recent Labs  Lab 09/06/18 1011 09/06/2018 1232  TROPONINI 0.03* 17.44*   ------------------------------------------------------------------------------------------------------------------ Invalid input(s): POCBNP  ---------------------------------------------------------------------------------------------------------------  Urinalysis    Component Value Date/Time   COLORURINE YELLOW (A) 01/01/2018 1639   APPEARANCEUR HAZY (A) 01/01/2018 1639   LABSPEC 1.018 01/01/2018 1639   PHURINE 7.0 01/01/2018 1639   GLUCOSEU NEGATIVE 01/01/2018 1639   HGBUR NEGATIVE 01/01/2018 1639   BILIRUBINUR neg 07/11/2018 1626   KETONESUR NEGATIVE 01/01/2018 1639   PROTEINUR Positive (A) 07/11/2018 1626   PROTEINUR 100 (A) 01/01/2018 1639   UROBILINOGEN 0.2 07/11/2018 1626   NITRITE neg 07/11/2018 1626   NITRITE NEGATIVE 01/01/2018 1639   LEUKOCYTESUR Trace (A) 07/11/2018 1626     RADIOLOGY: Dg Chest 2 View  Result Date: 09/07/2018 CLINICAL DATA:  Pacemaker. EXAM: CHEST - 2 VIEW COMPARISON:  No recent prior. FINDINGS: Cardiac pacer lead tip over the right atrium and right ventricle. Cardiomegaly. Diffuse bilateral from interstitial prominence. Interstitial changes could be chronic however an acute process including interstitial edema and/or pneumonitis could present in this fashion. No pleural  effusion or pneumothorax. Diffuse thoracic spine osteopenia degenerative change. IMPRESSION: Cardiac pacer with lead tip over the right atrium and right ventricle. Cardiomegaly. Diffuse bilateral pulmonary interstitial prominence. Interstitial changes could be from chronic interstitial disease however an acute process including interstitial edema and/or pneumonitis could present this fashion. No pneumothorax. Electronically Signed   By: Marcello Moores  Register   On: 09/07/2018 06:25   Dg Chest Portable 1 View  Result Date: 09/16/2018 CLINICAL DATA:  Fever last night. Shortness of breath. Patient status post pacemaker placement 09/06/2018. EXAM: PORTABLE CHEST 1 VIEW COMPARISON:  Single-view of the chest 09/06/2018 and 01/01/2018. CT chest 03/20/2018. FINDINGS: Changes of interstitial lung disease are again seen. There is new airspace disease in the right upper lobe. Heart size is normal. Pacing device is in place with leads projecting in the right atrium and right ventricle. No pneumothorax or pleural effusion. IMPRESSION: Increased airspace opacity in the right upper lobe most worrisome for pneumonia superimposed on chronic interstitial lung disease. Electronically Signed   By: Inge Rise M.D.   On: 09/01/2018 13:13    EKG: Orders placed or performed during the hospital encounter of 08/22/2018  . EKG 12-Lead  . EKG 12-Lead  . ED EKG  . ED EKG    IMPRESSION AND PLAN: Patient is 85 year old with chronic respiratory failure requiring 2 L oxygen due to interstitial lung disease presenting with worsening shortness of breath  1.  Acute on chronic respiratory failure requiring BiPAP Continue BiPAP for now We will treat with IV antibiotics Pulmonary critical care has been notified of admission down to stepdown for BiPAP  2.  Non-ST MI we will treat with IV heparin cardiology has been notified will refer repeat echocardiogram per cardiology  3.  Chronic interstitial lung disease provide supportive care  and oxygen therapy  4.  Acute kidney injury monitor renal function I will hold off on Lasix for now   5.  BPH continue Flomax  6.  Essential hypertension continue Norvasc  7.  Hypothyroidism continue Synthroid  All the records are reviewed and case discussed with ED provider. Management plans discussed with the patient, family and they are in agreement.  CODE STATUS: Code Status History    Date Active Date Inactive Code Status Order ID Comments User Context   09/06/2018 1508 09/07/2018 2222 Full Code [id number]  Charlynn Grimes Inpatient   01/01/2018 1951 01/03/2018 1649 Full Code [id number]  Gorden Harms, MD Inpatient   04/15/2015 1726 04/20/2015 1710 Full Code [id number]  Clayburn Pert, MD Inpatient       TOTAL TIME TAKING CARE OF THIS PATIENT: 55 minutes.    Dustin Flock M.D on 09/18/2018 at 1:51 PM  Between 7am to 6pm - Pager - [phone]  After 6pm go to [email or web address] - password [email or web address]  [facility] Office  [phone]  CC: Primary care physician; Leone Haven, MD

## 2018-09-09 ENCOUNTER — Inpatient Hospital Stay: Payer: Medicare Other

## 2018-09-09 ENCOUNTER — Inpatient Hospital Stay (HOSPITAL_COMMUNITY)
Admit: 2018-09-09 | Discharge: 2018-09-09 | Disposition: A | Discharge status: Home / Self Care | Payer: Medicare Other | Attending: Cardiovascular Disease | Admitting: Cardiovascular Disease

## 2018-09-09 ENCOUNTER — Other Ambulatory Visit: Payer: Self-pay

## 2018-09-09 DIAGNOSIS — I255 Ischemic cardiomyopathy: Secondary | ICD-10-CM

## 2018-09-09 DIAGNOSIS — I340 Nonrheumatic mitral (valve) insufficiency: Secondary | ICD-10-CM

## 2018-09-09 DIAGNOSIS — I959 Hypotension, unspecified: Secondary | ICD-10-CM

## 2018-09-09 DIAGNOSIS — I361 Nonrheumatic tricuspid (valve) insufficiency: Secondary | ICD-10-CM

## 2018-09-09 DIAGNOSIS — R0902 Hypoxemia: Secondary | ICD-10-CM

## 2018-09-09 DIAGNOSIS — R410 Disorientation, unspecified: Secondary | ICD-10-CM

## 2018-09-09 LAB — CBC
HCT: 39.2 % (ref 39.0–52.0)
Hemoglobin: 13 g/dL (ref 13.0–17.0)
MCH: 32.7 pg (ref 26.0–34.0)
MCHC: 33.2 g/dL (ref 30.0–36.0)
MCV: 98.7 fL (ref 80.0–100.0)
PLATELETS: 159 10*3/uL (ref 150–400)
RBC: 3.97 MIL/uL — ABNORMAL LOW (ref 4.22–5.81)
RDW: 13.4 % (ref 11.5–15.5)
WBC: 14.3 10*3/uL — ABNORMAL HIGH (ref 4.0–10.5)
nRBC: 0.1 % (ref 0.0–0.2)

## 2018-09-09 LAB — HEPARIN LEVEL (UNFRACTIONATED)
Heparin Unfractionated: 0.44 IU/mL (ref 0.30–0.70)
Heparin Unfractionated: 0.37 IU/mL (ref 0.30–0.70)

## 2018-09-09 LAB — BASIC METABOLIC PANEL
Anion gap: 13 (ref 5–15)
BUN: 44 mg/dL — ABNORMAL HIGH (ref 8–23)
CO2: 22 mmol/L (ref 22–32)
Calcium: 9.2 mg/dL (ref 8.9–10.3)
Chloride: 104 mmol/L (ref 98–111)
Creatinine, Ser: 1.58 mg/dL — ABNORMAL HIGH (ref 0.61–1.24)
GFR calc Af Amer: 42 mL/min — ABNORMAL LOW (ref >60)
GFR, EST NON AFRICAN AMERICAN: 36 mL/min — AB (ref >60)
Glucose, Bld: 123 mg/dL — ABNORMAL HIGH (ref 70–99)
POTASSIUM: 4.4 mmol/L (ref 3.5–5.1)
Sodium: 139 mmol/L (ref 135–145)

## 2018-09-09 LAB — PHOSPHORUS: Phosphorus: 3.6 mg/dL (ref 2.5–4.6)

## 2018-09-09 LAB — ECHOCARDIOGRAM COMPLETE
Height: 72 in
Weight: 3033.53 oz

## 2018-09-09 LAB — MAGNESIUM: Magnesium: 2 mg/dL (ref 1.7–2.4)

## 2018-09-09 LAB — TROPONIN I: Troponin I: 48.55 ng/mL (ref <0.03)

## 2018-09-09 MED ORDER — IPRATROPIUM-ALBUTEROL 0.5-2.5 (3) MG/3ML IN SOLN
3.0000 mL | RESPIRATORY_TRACT | Status: DC
  Administered 2018-09-09 – 2018-09-10 (×8): 3 mL via RESPIRATORY_TRACT
  Filled 2018-09-09 (×8): qty 3

## 2018-09-09 MED ORDER — SODIUM CHLORIDE 0.9 % IV BOLUS
250.0000 mL | Freq: Once | INTRAVENOUS | Status: AC
  Administered 2018-09-09: 250 mL via INTRAVENOUS

## 2018-09-09 MED ORDER — TRAZODONE HCL 100 MG PO TABS
100.0000 mg | ORAL_TABLET | Freq: Once | ORAL | Status: AC
  Administered 2018-09-09: 100 mg via ORAL
  Filled 2018-09-09: qty 1

## 2018-09-09 MED ORDER — HALOPERIDOL LACTATE 5 MG/ML IJ SOLN
2.0000 mg | Freq: Once | INTRAMUSCULAR | Status: AC
  Administered 2018-09-09: 2 mg via INTRAVENOUS

## 2018-09-09 MED ORDER — MORPHINE SULFATE (PF) 2 MG/ML IV SOLN
1.0000 mg | Freq: Once | INTRAVENOUS | Status: AC
  Administered 2018-09-09: 1 mg via INTRAVENOUS

## 2018-09-09 MED ORDER — DEXMEDETOMIDINE HCL IN NACL 400 MCG/100ML IV SOLN
0.0000 ug/kg/h | INTRAVENOUS | Status: DC
  Administered 2018-09-09: 0.4 ug/kg/h via INTRAVENOUS
  Filled 2018-09-09: qty 100

## 2018-09-09 MED ORDER — HALOPERIDOL LACTATE 5 MG/ML IJ SOLN
INTRAMUSCULAR | Status: AC
  Administered 2018-09-09: 2 mg via INTRAVENOUS
  Filled 2018-09-09: qty 1

## 2018-09-09 MED ORDER — CHLORHEXIDINE GLUCONATE 0.12 % MT SOLN
15.0000 mL | Freq: Two times a day (BID) | OROMUCOSAL | Status: DC
  Administered 2018-09-09 – 2018-09-10 (×3): 15 mL via OROMUCOSAL
  Filled 2018-09-09 (×2): qty 15

## 2018-09-09 MED ORDER — ORAL CARE MOUTH RINSE
15.0000 mL | Freq: Two times a day (BID) | OROMUCOSAL | Status: DC
  Administered 2018-09-09 – 2018-09-10 (×3): 15 mL via OROMUCOSAL

## 2018-09-09 MED ORDER — MORPHINE SULFATE (PF) 2 MG/ML IV SOLN
INTRAVENOUS | Status: AC
  Administered 2018-09-09: 1 mg via INTRAVENOUS
  Filled 2018-09-09: qty 1

## 2018-09-09 MED ORDER — SODIUM CHLORIDE 0.9 % IV SOLN
0.0000 ug/min | INTRAVENOUS | Status: DC
  Administered 2018-09-09 (×2): 50 ug/min via INTRAVENOUS
  Administered 2018-09-09: 40 ug/min via INTRAVENOUS
  Administered 2018-09-09: 50 ug/min via INTRAVENOUS
  Administered 2018-09-09 – 2018-09-10 (×3): 20 ug/min via INTRAVENOUS
  Filled 2018-09-09 (×6): qty 10

## 2018-09-09 MED ORDER — PHENYLEPHRINE HCL-NACL 10-0.9 MG/250ML-% IV SOLN
0.0000 ug/min | INTRAVENOUS | Status: DC
  Filled 2018-09-09: qty 250

## 2018-09-09 NOTE — Progress Notes (Signed)
Pt continues to get OOB despite trazodone administration and continued reduction in stimulation.  Pt is agitated and frequently attempts to get OOB

## 2018-09-09 NOTE — Progress Notes (Signed)
*  PRELIMINARY RESULTS* Echocardiogram 2D Echocardiogram has been performed.  Lavell Luster Tashanda Fuhrer 09/09/2018, 9:05 AM
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
## 2018-09-09 NOTE — Progress Notes (Signed)
ANTICOAGULATION CONSULT NOTE - Initial Consult  Pharmacy Consult for Heparin Drip Indication: chest pain/ACS  Allergies  Allergen Reactions  . Diphenhydramine Hcl Other (See Comments)    Reaction:  Unknown   . Simvastatin Other (See Comments)    Reaction:  Unknown     Patient Measurements: Height: 6' (182.9 cm) Weight: 189 lb 9.5 oz (86 kg) IBW/kg (Calculated) : 77.6 Heparin Dosing Weight: 84.9 kg  Vital Signs: Temp: 98.8 F (37.1 C) (03/21 2000) Temp Source: Oral (03/21 2000) BP: 109/94 (03/22 0200) Pulse Rate: 94 (03/22 0200)  Labs: Recent Labs    09/06/18 1011  09/16/2018 1232 09/13/2018 1410 09/15/2018 1939 09/09/18 0143  HGB 12.6*  --  13.3  --   --  13.0  HCT 38.3*  --  40.5  --   --  39.2  PLT 144*  --  168  --   --  159  APTT  --   --  36  --   --   --   LABPROT  --   --  15.4*  --   --   --   INR  --   --  1.2  --   --   --   HEPARINUNFRC  --   --   --   --   --  0.44  CREATININE 1.61*   < > 1.83*  --  1.67* 1.58*  TROPONINI 0.03*  --  17.44* 28.59* 55.99* 48.55*   < > = values in this interval not displayed.    Estimated Creatinine Clearance: 28.6 mL/min (A) (by C-G formula based on SCr of 1.58 mg/dL (H)).   Medical History: Past Medical History:  Diagnosis Date  . Hyperlipidemia   . Hypertension   . Hypothyroidism    approximately 2009  . ILD (interstitial lung disease) [facility])    Assessment: Patient is a 85yo male admitted with fever, patient had a pacemaker placed on 09/07/18. Pharmacy consulted for Heparin dosing for elevated troponin.  3/21 heparin infusion started @ 1000 units/hr 3/22 @ 0143 HL 0.44. Level therapeutic x 1   Goal of Therapy:  Heparin level 0.3-0.7 units/ml Monitor platelets by anticoagulation protocol: Yes   Plan:  3/22 @ 0143 HL 0.44. Level is therapeutic x 1.  Will continue current infusion rate of heparin 1000 units/hr.   Will recheck anti-Xa level in 8 hours and CBC and Anti-Xa levels daily while on  heparin.  Continue to monitor H&H and platelets   Pernell Dupre, PharmD, BCPS Clinical Pharmacist 09/09/2018 2:45 AM

## 2018-09-09 NOTE — Progress Notes (Signed)
[place] at [place] NAME: Paul Cross    MR#:  [id number]  DATE OF BIRTH:  01-Jun-1918  SUBJECTIVE:  CHIEF COMPLAINT:   Chief Complaint  Patient presents with  . Shortness of Breath   The patient is on HF O2 and precedex. REVIEW OF SYSTEMS:  Review of Systems  Unable to perform ROS: Mental status change    DRUG ALLERGIES:   Allergies  Allergen Reactions  . Diphenhydramine Hcl Other (See Comments)    Reaction:  Unknown   . Simvastatin Other (See Comments)    Reaction:  Unknown    VITALS:  Blood pressure 107/64, pulse 64, temperature 99.6 F (37.6 C), temperature source Axillary, resp. rate (!) 31, height 6' (1.829 m), weight 86 kg, SpO2 91 %. PHYSICAL EXAMINATION:  Physical Exam Constitutional:      General: He is not in acute distress. HENT:     Head: Normocephalic.  Eyes:     General: No scleral icterus.    Conjunctiva/sclera: Conjunctivae normal.  Neck:     Musculoskeletal: Neck supple.     Vascular: No JVD.     Trachea: No tracheal deviation.  Cardiovascular:     Rate and Rhythm: Normal rate and regular rhythm.     Heart sounds: Normal heart sounds. No murmur. No gallop.   Pulmonary:     Effort: Pulmonary effort is normal. No respiratory distress.     Breath sounds: No wheezing.     Comments: Crackles. Abdominal:     General: Bowel sounds are normal. There is no distension.     Palpations: Abdomen is soft.     Tenderness: There is no abdominal tenderness. There is no rebound.  Musculoskeletal:        General: No tenderness.     Right lower leg: No edema.     Left lower leg: No edema.  Skin:    Findings: No erythema or rash.  Neurological:     Comments: Unable to exam. On precedex drip.    LABORATORY PANEL:  Male CBC Recent Labs  Lab 09/09/18 0143  WBC 14.3*  HGB 13.0  HCT 39.2  PLT 159    ------------------------------------------------------------------------------------------------------------------ Chemistries  Recent Labs  Lab 09/09/2018 1232  09/09/18 0143  NA 136   < > 139  K 5.4*   < > 4.4  CL 101   < > 104  CO2 24   < > 22  GLUCOSE 163*   < > 123*  BUN 42*   < > 44*  CREATININE 1.83*   < > 1.58*  CALCIUM 9.5   < > 9.2  MG  --    < > 2.0  AST 236*  --   --   ALT 102*  --   --   ALKPHOS 44  --   --   BILITOT 2.2*  --   --    < > = values in this interval not displayed.   RADIOLOGY:  Dg Chest Portable 1 View  Result Date: 09/10/2018 CLINICAL DATA:  Fever last night. Shortness of breath. Patient status post pacemaker placement 09/06/2018. EXAM: PORTABLE CHEST 1 VIEW COMPARISON:  Single-view of the chest 09/06/2018 and 01/01/2018. CT chest 03/20/2018. FINDINGS: Changes of interstitial lung disease are again seen. There is new airspace disease in the right upper lobe. Heart size is normal. Pacing device is in place with leads projecting in the right atrium and right ventricle. No pneumothorax or pleural effusion. IMPRESSION:  Increased airspace opacity in the right upper lobe most worrisome for pneumonia superimposed on chronic interstitial lung disease. Electronically Signed   By: Inge Rise M.D.   On: 09/10/2018 13:13   ASSESSMENT AND PLAN:   Patient is 85 year old with chronic respiratory failure requiring 2 L oxygen due to interstitial lung disease presenting with worsening shortness of breath  1.  Acute on chronic respiratory failure with hypoxia. Unable to exclude hospital-acquired pneumonia. Off BiPAP Continue HF O2. NEB. Continue zithromax and rocephin. Follow up blood culture and CBC.  2.  Non-STEMI  Continue IV heparin drip. Follow up echo and Dr. Donivan Scull recommendation.  3.  Chronic interstitial lung disease. supportive care and oxygen therapy  4.  Acute kidney injury on CKD stage 3. Follow up BMP.  Acute metabolic encephalopathy. Due  to above.  5.  BPH continue Flomax  6.  Essential hypertension. Hold Norvasc due to hypotension.  7.  Hypothyroidism continue Synthroid  I discussed with Dr. Lanney Gins. All the records are reviewed and case discussed with Care Management/Social Worker. Management plans discussed with the patient, family and they are in agreement.  CODE STATUS: DNR  TOTAL TIME TAKING CARE OF THIS PATIENT: 38  minutes.   More than 50% of the time was spent in counseling/coordination of care: YES  POSSIBLE D/C IN ? DAYS, DEPENDING ON CLINICAL CONDITION.   Demetrios Loll M.D on 09/09/2018 at 10:24 AM  Between 7am to 6pm - Pager - [phone]  After 6pm go to [email or web address] - [employer] [facility]

## 2018-09-09 NOTE — Progress Notes (Signed)
Ch attempted top visit w/ pt but nurse and provider were present at the time and pt presented to be resting. F/u at a later time to complete OR for prayer.

## 2018-09-09 NOTE — Progress Notes (Signed)
ANTICOAGULATION CONSULT NOTE  Pharmacy Consult for Heparin Drip Indication: chest pain/ACS  Patient Measurements: Height: 6' (182.9 cm) Weight: 189 lb 9.5 oz (86 kg) IBW/kg (Calculated) : 77.6 Heparin Dosing Weight: 84.9 kg  Vital Signs: Temp: 99.6 F (37.6 C) (03/22 0800) Temp Source: Axillary (03/22 0800) BP: 107/64 (03/22 0800) Pulse Rate: 64 (03/22 0800)  Labs: Recent Labs    09/06/18 1011  08/31/2018 1232 09/06/2018 1410 09/18/2018 1939 09/09/18 0143  HGB 12.6*  --  13.3  --   --  13.0  HCT 38.3*  --  40.5  --   --  39.2  PLT 144*  --  168  --   --  159  APTT  --   --  36  --   --   --   LABPROT  --   --  15.4*  --   --   --   INR  --   --  1.2  --   --   --   HEPARINUNFRC  --   --   --   --   --  0.44  CREATININE 1.61*   < > 1.83*  --  1.67* 1.58*  TROPONINI 0.03*  --  17.44* 28.59* 55.99* 48.55*   < > = values in this interval not displayed.    Estimated Creatinine Clearance: 28.6 mL/min (A) (by C-G formula based on SCr of 1.58 mg/dL (H)).   Medical History: Past Medical History:  Diagnosis Date  . Hyperlipidemia   . Hypertension   . Hypothyroidism    approximately 2009  . ILD (interstitial lung disease) [facility])    Assessment: Patient is a 85yo male admitted with fever, patient had a pacemaker placed on 09/07/18. Pharmacy consulted for Heparin dosing for elevated troponin. Hgb, PLT wnl  Heparin Course 3/21 heparin infusion started @ 1000 units/hr 3/22 0143 HL 0.44 3/22 0943 HL 0.37  Goal of Therapy:  Heparin level 0.3-0.7 units/ml Monitor platelets by anticoagulation protocol: Yes   Plan:  --this is the second consecutive therapeutic heparin level --continue current infusion rate of heparin 1000 units/hr --CBC and Anti-Xa levels daily while on heparin  Continue to monitor H&H and platelets   Dallie Piles, PharmD Clinical Pharmacist 09/09/2018 9:42 AM

## 2018-09-09 NOTE — Plan of Care (Signed)
Pts daughter educated re ICU

## 2018-09-09 NOTE — Progress Notes (Signed)
Increased flow on high flow and placed svn tx in line. This patient is actively trying to get out of the bed, thrashing around in the bed, hollering out at times, needing a lot of redirecting from RN. o2 saturation increases when he is calm not talking or moving but drops when he starts being active. Received call to put this patient on bipap however he has been aggressively hitting and doubt patient will tolerate bipap at this time. Will continue to monitor.

## 2018-09-09 NOTE — Progress Notes (Signed)
NP Patria Mane notified of patient's low urine output of 135 during day shift. Verbal order given to in and out cath q4 if patient has greater than 300 ml of urine in bladder.   Patient bladder scanned and greater than 437 mls in bladder.

## 2018-09-09 NOTE — Progress Notes (Signed)
Progress Note  Patient Name: Paul Cross Date of Encounter: 09/09/2018  Primary Cardiologist: New to [place], on high flow nasal cannula Events overnight was acutely agitated, climbing out of the bed starting around 2300 hrs. Combative, unable to redirect per the notes Further details was threatening staff Frequent attempts to get out of bed despite redirection Trazodone with no improvement in symptoms Haldol given 2 mg IV followed by morphine sulfate 1 mg IV push 3 AM Worsening hallucinations, violent outbursts Hypertensive in the setting of sedatives above Started on low-dose pressors Increased requirement in oxygen to maintain saturations Given agitation would not tolerate BiPAP  This morning was lethargic, systolic pressures in the high 80s no distress On low-dose pressors  Inpatient Medications    Scheduled Meds:  amLODipine  5 mg Oral Daily   aspirin EC  81 mg Oral Daily   chlorhexidine  15 mL Mouth Rinse BID   cholecalciferol  1,000 Units Oral Daily   ipratropium-albuterol  3 mL Nebulization Q4H   levothyroxine  137 mcg Oral QAC breakfast   mouth rinse  15 mL Mouth Rinse q12n4p   mometasone-formoterol  2 puff Inhalation BID   multivitamin with minerals  1 tablet Oral Daily   sodium chloride flush  3 mL Intravenous Q12H   tamsulosin  0.4 mg Oral QHS   vitamin B-12  1,000 mcg Oral Daily   Continuous Infusions:  sodium chloride     azithromycin 500 mg (09/06/2018 1655)   cefTRIAXone (ROCEPHIN)  IV 1 g (08/23/2018 1800)   heparin 1,000 Units/hr (09/09/18 1241)   phenylephrine (NEO-SYNEPHRINE) Adult infusion 50 mcg/min (09/09/18 1200)   PRN Meds: sodium chloride, acetaminophen **OR** acetaminophen, acetaminophen, ondansetron **OR** ondansetron (ZOFRAN) IV, sodium chloride flush   Vital Signs    Vitals:   09/09/18 0700 09/09/18 0800 09/09/18 1000 09/09/18 1200  BP: (!) 72/49 107/64 (!) 90/53 (!) 91/53  Pulse: 61 64  (!) 59 60  Resp: (!) 27 (!) 31 (!) 25 (!) 25  Temp:  99.6 F (37.6 C)  98.2 F (36.8 C)  TempSrc:  Axillary  Axillary  SpO2: 97% 91% 92% 96%  Weight:  86 kg    Height:  6' (1.829 m)      Intake/Output Summary (Last 24 hours) at 09/09/2018 1342 Last data filed at 09/09/2018 1200 Gross per 24 hour  Intake 1382.47 ml  Output 725 ml  Net 657.47 ml   Last 3 Weights 09/09/2018 08/23/2018 09/07/2018  Weight (lbs) 189 lb 9.5 oz 189 lb 9.5 oz 192 lb  Weight (kg) 86 kg 86 kg 87.091 kg      Telemetry    Sinus tachycardia- Personally Reviewed  ECG    - Personally Reviewed  Physical Exam   GEN: No acute distress.  Sleeping Neck: No JVD Cardiac: RRR, no murmurs, rubs, or gallops.  Respiratory:  Coarse breath sounds bilaterally GI: Soft,  non-distended  MS: No edema; No deformity. Neuro:  Nonfocal , unable to fully test Psych: Lethargic/sleeping  Labs    Chemistry Recent Labs  Lab 09/06/18 1011  09/07/2018 1232 09/07/2018 1939 09/09/18 0143  NA 136   < > 136 141 139  K 5.0   < > 5.4* 4.5 4.4  CL 104   < > 101 105 104  CO2 24   < > 24 24 22   GLUCOSE 121*   < > 163* 127* 123*  BUN 47*   < > 42* 42* 44*  CREATININE  1.61*   < > 1.83* 1.67* 1.58*  CALCIUM 9.5   < > 9.5 8.9 9.2  PROT 6.8  --  7.6  --   --   ALBUMIN 3.9  --  4.0  --   --   AST 18  --  236*  --   --   ALT 16  --  102*  --   --   ALKPHOS 35*  --  44  --   --   BILITOT 0.9  --  2.2*  --   --   GFRNONAA 35*   < > 30* 34* 36*  GFRAA 41*   < > 35* 39* 42*  ANIONGAP 8   < > 11 12 13    < > = values in this interval not displayed.     Hematology Recent Labs  Lab 09/06/18 1011 09/03/2018 1232 09/09/18 0143  WBC 7.2 13.3* 14.3*  RBC 3.82* 4.10* 3.97*  HGB 12.6* 13.3 13.0  HCT 38.3* 40.5 39.2  MCV 100.3* 98.8 98.7  MCH 33.0 32.4 32.7  MCHC 32.9 32.8 33.2  RDW 13.2 13.2 13.4  PLT 144* 168 159    Cardiac Enzymes Recent Labs  Lab 09/02/2018 1232 08/20/2018 1410 09/16/2018 1939 09/09/18 0143  TROPONINI 17.44*  28.59* 55.99* 48.55*   No results for input(s): TROPIPOC in the last 168 hours.   BNP Recent Labs  Lab 08/23/2018 1232  BNP 1,535.0*     DDimer No results for input(s): DDIMER in the last 168 hours.   Radiology    US Venous Img Lower Bilateral  Result Date: 09/09/2018 CLINICAL DATA:  Pulmonary embolism. EXAM: BILATERAL LOWER EXTREMITY VENOUS DOPPLER ULTRASOUND TECHNIQUE: Gray-scale sonography with graded compression, as well as color Doppler and duplex ultrasound were performed to evaluate the lower extremity deep venous systems from the level of the common femoral vein and including the common femoral, femoral, profunda femoral, popliteal and calf veins including the posterior tibial, peroneal and gastrocnemius veins when visible. The superficial great saphenous vein was also interrogated. Spectral Doppler was utilized to evaluate flow at rest and with distal augmentation maneuvers in the common femoral, femoral and popliteal veins. COMPARISON:  None. FINDINGS: RIGHT LOWER EXTREMITY Common Femoral Vein: No evidence of thrombus. Normal compressibility, respiratory phasicity and response to augmentation. Saphenofemoral Junction: No evidence of thrombus. Normal compressibility and flow on color Doppler imaging. Profunda Femoral Vein: No evidence of thrombus. Normal compressibility and flow on color Doppler imaging. Femoral Vein: No evidence of thrombus. Normal compressibility, respiratory phasicity and response to augmentation. Popliteal Vein: No evidence of thrombus. Normal compressibility, respiratory phasicity and response to augmentation. Calf Veins: No obvious evidence of thrombus. Visualization limited by overlying soft tissue edema. Superficial Great Saphenous Vein: No evidence of thrombus. Normal compressibility. Venous Reflux:  None. Other Findings:  None. LEFT LOWER EXTREMITY Common Femoral Vein: No evidence of thrombus. Normal compressibility, respiratory phasicity and response to  augmentation. Saphenofemoral Junction: No evidence of thrombus. Normal compressibility and flow on color Doppler imaging. Profunda Femoral Vein: No evidence of thrombus. Normal compressibility and flow on color Doppler imaging. Femoral Vein: No evidence of thrombus. Normal compressibility, respiratory phasicity and response to augmentation. Popliteal Vein: No evidence of thrombus. Normal compressibility, respiratory phasicity and response to augmentation. Calf Veins: No obvious evidence of thrombus. Visualization limited by overlying soft tissue edema. Superficial Great Saphenous Vein: No evidence of thrombus. Normal compressibility. Venous Reflux:  None. Other Findings:  None. IMPRESSION: No evidence of deep venous thrombosis in either lower  extremity. Electronically Signed   By: Franki Cabot M.D.   On: 09/09/2018 12:41   Dg Chest Portable 1 View  Result Date: 09/09/2018 CLINICAL DATA:  Fever last night. Shortness of breath. Patient status post pacemaker placement 09/06/2018. EXAM: PORTABLE CHEST 1 VIEW COMPARISON:  Single-view of the chest 09/06/2018 and 01/01/2018. CT chest 03/20/2018. FINDINGS: Changes of interstitial lung disease are again seen. There is new airspace disease in the right upper lobe. Heart size is normal. Pacing device is in place with leads projecting in the right atrium and right ventricle. No pneumothorax or pleural effusion. IMPRESSION: Increased airspace opacity in the right upper lobe most worrisome for pneumonia superimposed on chronic interstitial lung disease. Electronically Signed   By: Inge Rise M.D.   On: 08/23/2018 13:13    Cardiac Studies   Echo  1. The left ventricle has mild-moderately reduced systolic function, with an ejection fraction of 35 to 40%. The cavity size was normal. Hypokinesis of the inferior wall. Left ventricular diastolic Doppler parameters are consistent with  pseudonormalization.  2. The right ventricle has normal systolic function. The  cavity was midlly dilated. There is no increase in right ventricular wall thickness. Right ventricular systolic pressure is mild to moderately elevated with an estimated pressure of 44.1 mmHg.  3. Left atrial size was moderately dilated.  4. Challenging image quality   Patient Profile     Paul Cross is a 85 y.o. male with a hx of ILD, hypertension, hyperlipidemia, pulmonary hypertension, reported COPD versus interstitial lung disease, prior tobacco abuse and hypothyroidism , high-grade AV block with syncope, [facility] in [facility] 09/07/2018 with worsening shortness of breath, hypoxia, NSTEMI  Assessment & Plan    1. Non-STEMI Risk factors include age, long smoking history who continues to smoke Prior CT scan October 2019 with left main and three-vessel coronary disease Asymptomatic on arrival only moderate shortness of breath EKG with no acute findings, paced rhythm -Started on heparin infusion --Echocardiogram concerning for inferior wall MI -Poor candidate for cardiac catheterization in his current state given his underlying lung disease, hypoxia, age -Would continue heparin.  Unable to use beta blockers given hypotension.  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 seen on recent CT scan October 2019, followed by pulmonary Required BiPAP on arrival now high flow nasal cannula Started on broad-spectrum antibiotics Elevated white count, diffuse wheeziness bilaterally -Certainly could be exacerbated by acute MI , inferior wall -He is DNR/DNI, supportive therapy Consider  steroids, nebs  3) high-grade AV block, pacer Placed recently at [facility] with discharge 3/20 to facility, rehab Appears to be pacing appropriately on EKG  4) acute on chronic renal failure Function will be tenuous in the setting of hypoxia, hypotension If urine output slows may require Lasix with blood pressure support  5) confusion Second night in a row per the notes, was appropriate in the daytime Likely sundowning/dementia Urinalysis and culture pending Blood cultures pending Possibly exacerbated by recent hospitalization, anesthesia for pacemaker and hypoxia  Long discussion with intensivist, nurse at the bedside Discussed various treatment options for his non-STEMI Options are limited  Total encounter time more than 45 minutes  Greater than 50%  was spent in counseling and coordination of care with the patient   For questions or updates, please contact [place] Please consult [email or web address] for contact info under        Signed, Ida Rogue, MD  09/09/2018, 1:42 PM

## 2018-09-09 NOTE — Progress Notes (Addendum)
  Ch f/u with pt daughter via telephone to see how well the pt is responding. [place] shared w/ daughter that pt was resting when the ch attempted to visit. Daughter shared that staff informed her not to visit today and spoke of how challenging that has been on her. Pt is still awaiting results to rule out the possibility of an infection in his lungs as reported by the daughter. Ch asked about what it would look like for pt to transition out of the hospital once the pt gains his strength and the daughter shared that she is not sure because she is the only caregiver and it would be too much for her to handle alone. Ch understood but also spoke about the limitations that H-H and SNFs will hv due to the COVID-19. Ch asked about the confusion that the pt had been experiencing which the daughter does not understand either. Pt does not have hx of dementia and is still responsive as the daughter shared. Ch stated that the daughter should receive updates from the care staff and that f/u will occur later w/ the pt.    09/09/18 1100  Clinical Encounter Type  Visited With Patient  Visit Type Follow-up;Psychological support;Spiritual support;Social support  Referral From Nurse  Consult/Referral To Chaplain  Spiritual Encounters  Spiritual Needs Grief support;Emotional  Stress Factors  Patient Stress Factors Family relationships;Exhausted;Health changes  Family Stress Factors Exhausted;Family relationships;Lack of caregivers;Loss of control;Major life changes

## 2018-09-09 NOTE — Progress Notes (Signed)
Pt has been climbing out of the bed during the shift.   Pt was directed.  At approximately 2315, pt became combative and unable to redirect.  CNA at bedside.  Pt currently combative and threatens staff (balls up fists.)  Patria Mane, NP notified.

## 2018-09-09 NOTE — Progress Notes (Signed)
CRITICAL CARE NOTE         SUBJECTIVE FINDINGS & SIGNIFICANT EVENTS    Resting in bed comfortably, on precedex rass-1  Overnight events noted. Would favor not using central acting medications as this tends to prolong ICU LOS and cause delirium in advanced age.  Plan to treat underlying cause of altered sensorium including hypoxemia, metabolic derangements and infectious etiology.  S/P TTE - reviewed report   PAST MEDICAL HISTORY   Past Medical History:  Diagnosis Date  . Hyperlipidemia   . Hypertension   . Hypothyroidism    approximately 2009  . ILD (interstitial lung disease) ([place])      SURGICAL HISTORY   Past Surgical History:  Procedure Laterality Date  . CHOLECYSTECTOMY N/A 04/16/2015   Procedure: LAPAROSCOPIC CHOLECYSTECTOMY;  Surgeon: Clayburn Pert, MD;  Location: [facility] ORS;  Service: General;  Laterality: N/A;  . FEMORAL ARTERY REPAIR    . PACEMAKER IMPLANT N/A 09/06/2018   Procedure: PACEMAKER IMPLANT;  Surgeon: Constance Haw, MD;  Location: [facility];  Service: Cardiovascular;  Laterality: N/A;     FAMILY HISTORY   Family History  Problem Relation Age of Onset  . Cancer Sister        breast  . Diabetes Brother   . Heart disease Brother   . Stroke Sister   . Depression Daughter      SOCIAL HISTORY   Social History   Tobacco Use  . Smoking status: Former Research scientist (life sciences)  . Smokeless tobacco: Former Network engineer Use Topics  . Alcohol use: No  . Drug use: No     MEDICATIONS   Current Medication:  Current Facility-Administered Medications:  .  0.9 %  sodium chloride infusion, 250 mL, Intravenous, PRN, Dustin Flock, MD .  acetaminophen (TYLENOL) tablet 650 mg, 650 mg, Oral, Q6H PRN **OR** acetaminophen (TYLENOL) suppository 650 mg, 650 mg, Rectal, Q6H  PRN, Dustin Flock, MD .  acetaminophen (TYLENOL) tablet 975 mg, 975 mg, Oral, TID PRN, Dustin Flock, MD .  amLODipine (NORVASC) tablet 5 mg, 5 mg, Oral, Daily, Dustin Flock, MD, 5 mg at 08/21/2018 1733 .  aspirin EC tablet 81 mg, 81 mg, Oral, Daily, Dustin Flock, MD, 81 mg at 09/07/2018 1733 .  azithromycin (ZITHROMAX) 500 mg in sodium chloride 0.9 % 250 mL IVPB, 500 mg, Intravenous, Q24H, Mariyanna Mucha, MD, Last Rate: 250 mL/hr at 09/10/2018 1655, 500 mg at 09/10/2018 1655 .  cefTRIAXone (ROCEPHIN) 1 g in sodium chloride 0.9 %  100 mL IVPB, 1 g, Intravenous, Q24H, Ayde Record, MD, Last Rate: 200 mL/hr at 08/21/2018 1800, 1 g at 09/07/2018 1800 .  chlorhexidine (PERIDEX) 0.12 % solution 15 mL, 15 mL, Mouth Rinse, BID, Danitra Payano, MD .  cholecalciferol (VITAMIN D) tablet 1,000 Units, 1,000 Units, Oral, Daily, Dustin Flock, MD .  heparin ADULT infusion 100 units/mL (25000 units/226m sodium chloride 0.45%), 1,000 Units/hr, Intravenous, Continuous, PDustin Flock MD, Last Rate: 10 mL/hr at 09/09/18 1000, 1,000 Units/hr at 09/09/18 1000 .  ipratropium-albuterol (DUONEB) 0.5-2.5 (3) MG/3ML nebulizer solution 3 mL, 3 mL, Nebulization, Q4H, Tukov-Yual, Magdalene S, NP, 3 mL at 09/09/18 0735 .  levothyroxine (SYNTHROID, LEVOTHROID) tablet 137 mcg, 137 mcg, Oral, QAC breakfast, PDustin Flock MD .  MEDLINE mouth rinse, 15 mL, Mouth Rinse, q12n4p, AOttie Glazier MD .  mometasone-formoterol ([facility] 200-5 MCG/ACT inhaler 2 puff, 2 puff, Inhalation, BID, PDustin Flock MD, 2 puff at 08/31/2018 2005 .  multivitamin with minerals tablet 1 tablet, 1 tablet, Oral, Daily, PDustin Flock MD .  ondansetron (ZOFRAN) tablet 4 mg, 4 mg, Oral, Q6H PRN **OR** ondansetron (ZOFRAN) injection 4 mg, 4 mg, Intravenous, Q6H PRN, PDustin Flock MD, 4 mg at 09/09/18 0513 .  phenylephrine (NEO-SYNEPHRINE) 10 mg in sodium chloride 0.9 % 250 mL (0.04 mg/mL) infusion, 0-400 mcg/min, Intravenous, Titrated,  Tukov-Yual, Magdalene S, NP, Last Rate: 75 mL/hr at 09/09/18 1000, 50 mcg/min at 09/09/18 1000 .  sodium chloride flush (NS) 0.9 % injection 3 mL, 3 mL, Intravenous, Q12H, PDustin Flock MD, 3 mL at 09/09/18 0845 .  sodium chloride flush (NS) 0.9 % injection 3 mL, 3 mL, Intravenous, PRN, PDustin Flock MD .  tamsulosin (FLOMAX) capsule 0.4 mg, 0.4 mg, Oral, QHS, PDustin Flock MD, 0.4 mg at 08/21/2018 2158 .  vitamin B-12 (CYANOCOBALAMIN) tablet 1,000 mcg, 1,000 mcg, Oral, Daily, PDustin Flock MD  Facility-Administered Medications Ordered in Other Encounters:  .  albuterol (PROVENTIL) (2.5 MG/3ML) 0.083% nebulizer solution 2.5 mg, 2.5 mg, Nebulization, Once, McQuaid, Douglas B, MD    ALLERGIES   Diphenhydramine hcl and Simvastatin    REVIEW OF SYSTEMS    Unable to obtain ROS due to acutely ill status.   PHYSICAL EXAMINATION   Vitals:   09/09/18 0800 09/09/18 1000  BP: 107/64 (!) 90/53  Pulse: 64 (!) 59  Resp: (!) 31 (!) 25  Temp: 37.6 C   SpO2: 91% 92%    GENERAL:RASS-1 HEAD: Normocephalic, atraumatic.  EYES: Pupils equal, round, reactive to light.  No scleral icterus.  MOUTH: Moist mucosal membrane. NECK: Supple. No thyromegaly. No nodules. No JVD.  PULMONARY: decreased breath sounds bilaterally  CARDIOVASCULAR: S1 and S2. Regular rate and rhythm. No murmurs, rubs, or gallops.  GASTROINTESTINAL: Soft, nontender, non-distended. No masses. Positive bowel sounds. No hepatosplenomegaly.  MUSCULOSKELETAL: No swelling, clubbing, or edema.  NEUROLOGIC: Mild distress due to acute illness SKIN:intact,warm,dry   LABS AND IMAGING      LAB RESULTS: Recent Labs  Lab 08/28/2018 1232 08/23/2018 1939 09/09/18 0143  NA 136 141 139  K 5.4* 4.5 4.4  CL 101 105 104  CO2 '24 24 22  ' BUN 42* 42* 44*  CREATININE 1.83* 1.67* 1.58*  GLUCOSE 163* 127* 123*   Recent Labs  Lab 09/06/18 1011 09/17/2018 1232 09/09/18 0143  HGB 12.6* 13.3 13.0  HCT 38.3* 40.5 39.2  WBC 7.2  13.3* 14.3*  PLT 144* 168 159     IMAGING RESULTS: Dg Chest Portable 1 View  Result Date: 09/07/2018 CLINICAL DATA:  Fever last  night. Shortness of breath. Patient status post pacemaker placement 09/06/2018. EXAM: PORTABLE CHEST 1 VIEW COMPARISON:  Single-view of the chest 09/06/2018 and 01/01/2018. CT chest 03/20/2018. FINDINGS: Changes of interstitial lung disease are again seen. There is new airspace disease in the right upper lobe. Heart size is normal. Pacing device is in place with leads projecting in the right atrium and right ventricle. No pneumothorax or pleural effusion. IMPRESSION: Increased airspace opacity in the right upper lobe most worrisome for pneumonia superimposed on chronic interstitial lung disease. Electronically Signed   By: Inge Rise M.D.   On: 09/04/2018 13:13     TTE-09/09/18  1. The left ventricle has mild-moderately reduced systolic function, with an ejection fraction of 35 to 40%. The cavity size was normal. Hypokinesis of the inferior wall. Left ventricular diastolic Doppler parameters are consistent with  pseudonormalization.  2. The right ventricle has normal systolic function. The cavity was midlly dilated. There is no increase in right ventricular wall thickness. Right ventricular systolic pressure is mild to moderately elevated with an estimated pressure of 44.1 mmHg.  3. Left atrial size was moderately dilated.  4. Challenging image quality  FINDINGS  Left Ventricle: The left ventricle has moderately reduced systolic function, with an ejection fraction of 35-40%. The cavity size was normal. There is no increase in left ventricular wall thickness. Left ventricular diastolic Doppler parameters are  consistent with pseudonormalization. Right Ventricle: The right ventricle has normal systolic function. The cavity was mildly enlarged. There is no increase in right ventricular wall thickness. Right ventricular systolic pressure is mildly elevated with an  estimated pressure of 44.1 mmHg. Left Atrium: left atrial size was moderately dilated Right Atrium: right atrial size was normal in size. Right atrial pressure is estimated at 10 mmHg. Interatrial Septum: No atrial level shunt detected by color flow Doppler. Pericardium: There is no evidence of pericardial effusion. Mitral Valve: The mitral valve is grossly normal. Mitral valve regurgitation is mild by color flow Doppler. Tricuspid Valve: The tricuspid valve is grossly normal. Tricuspid valve regurgitation is mild by color flow Doppler. Aortic Valve: The aortic valve is normal in structure. Moderate calcification of the aortic valve. Aortic valve regurgitation was not visualized by color flow Doppler. There is no stenosis of the aortic valve. Pulmonic Valve: The pulmonic valve was grossly normal. Pulmonic valve regurgitation was not assessed by color flow Doppler. Venous: The inferior vena cava is normal in size with greater than 50% respiratory variability.   LEFT VENTRICLE PLAX 2D LVIDd:         4.87 cm       Diastology LVIDs:         3.75 cm       LV e' lateral:   5.55 cm/s LV PW:         1.20 cm       LV E/e' lateral: 15.2 LV IVS:        1.36 cm       LV e' medial:    5.66 cm/s LVOT diam:     1.90 cm       LV E/e' medial:  14.9 LV SV:         51 ml LV SV Index:   24.38 LVOT Area:     2.84 cm   LV Volumes (MOD) LV area d, A2C:    27.30 cm LV area d, A4C:    22.20 cm LV area s, A2C:    22.20 cm LV area s, A4C:  16.80 cm LV major d, A2C:   8.16 cm LV major d, A4C:   6.56 cm LV major s, A2C:   7.29 cm LV major s, A4C:   6.20 cm LV vol d, MOD A2C: 71.9 ml LV vol d, MOD A4C: 61.3 ml LV vol s, MOD A2C: 54.3 ml LV vol s, MOD A4C: 36.6 ml LV SV MOD A2C:     17.6 ml LV SV MOD A4C:     61.3 ml LV SV MOD BP:      25.8 ml  RIGHT VENTRICLE RV Basal diam:  3.84 cm TAPSE (M-mode): 1.4 cm RVSP:           44.1 mmHg  LEFT ATRIUM             Index       RIGHT ATRIUM            Index LA diam:        2.90 cm 1.39 cm/m  RA Pressure: 10 mmHg LA Vol (A2C):   43.1 ml 20.69 ml/m RA Area:     18.50 cm LA Vol (A4C):   79.7 ml 38.27 ml/m RA Volume:   58.40 ml  28.04 ml/m LA Biplane Vol: 59.2 ml 28.42 ml/m  AORTIC VALVE LVOT Vmax:   82.80 cm/s LVOT Vmean:  57.200 cm/s LVOT VTI:    0.181 m   AORTA Ao Root diam: 3.50 cm Ao Asc diam:  3.40 cm  MITRAL VALVE              TRICUSPID VALVE MV Area (PHT): 3.65 cm   TV Peak grad:   34.5 mmHg MV PHT:        60.32 msec TV Vmax:        2.94 m/s MV Decel Time: 208 msec   TR Peak grad:   34.1 mmHg MV E velocity: 84.40 cm/s TR Vmax:        292.00 cm/s MV A velocity: 82.00 cm/s RVSP:           44.1 mmHg MV E/A ratio:  1.03                           SHUNTS                           Systemic VTI:  0.18 m                           Systemic Diam: 1.90 cm    Ida Rogue MD Electronically signed by Ida Rogue MD Signature Date/Time: 09/09/2018/10:34:15 AM       Final     ASSESSMENT AND PLAN   Multidisciplinary rounds held today  Acute on chronic Hypoxic Respiratory Failure - possible pneumonia with concomitant chronic ILD and traction bronchiectasis -will place on empiric community acquired pneumonia regimen -possible Pulmonary embolism - CT angio pending  -resp cx -continue BIPAP transition to  with goal sPo2 88-94% -continue Bronchodilator Therapy -Wean Fio2 and PEEP as tolerated -will perform SAT/SBT when respiratory parameters are met   CARDIAC FAILURE- ACS - NSTEMI  -Cardiology on case-Dr. Sidney Ace case- appreciate recommendations -History of the left main and three-vessel coronary artery disease high risk for cardiac cath.   high-grade AV block status post permanent pacemaker placement, currently hypotensive possibly secondary to sedatives will DC for now, CT angio pending rule out pulmonary venous thromboembolism. -  Expectant medical management for now continue IV heparin infusion  -oxygen as needed -follow up cardiac enzymes as indicated Status post transthoracic echo- report reviewed - as above  ICU monitoring   Renal Failure-acute kidney injury stage II -Possibly due to ATN versus prerenal azotemia secondary to decreased p.o. intake with dehydration -follow chem 7 -follow UO -continue Foley Catheter-assess need daily   NEUROLOGY -Patient is currently with altered mentation including confusion possibly secondary to dehydration versus metabolic encephalopathy versus infectious etiology -will dc sedation and analgesia   ID -continue IV abx as prescibed -follow up cultures-blood cultures obtained in ED x2 results pending, we will do a urinalysis and urine culture due to complaints of urinary frequency and fatigue in the past week  GI/Nutrition GI PROPHYLAXIS as indicated DIET-->TF's as tolerated Constipation protocol as indicated  ENDO - ICU hypoglycemic\Hyperglycemia protocol -check FSBS per protocol   ELECTROLYTES -follow labs as needed-hyperkalemia -replace as needed -pharmacy consultation   DVT/GI PRX ordered -SCDs  TRANSFUSIONS AS NEEDED MONITOR FSBS ASSESS the need for LABS as needed    Critical care provider statement:   Critical care time (minutes): 35  Critical care time was exclusive of: Separately billable procedures and treating other patients  Critical care was necessary to treat or prevent imminent or life-threatening deterioration of the following conditions:  Altered mental status, confusion, hyperkalemia, acute coronary syndrome, non-STEMI, acute on chronic hypoxemic respiratory failure, multiple comorbid conditions.  Critical care was time spent personally by me on the following activities: Development of treatment plan with patient or surrogate, discussions with consultants, evaluation of patient's response to treatment, examination of patient, obtaining history from patient or surrogate, ordering and  performing treatments and interventions, ordering and review of laboratory studies and re-evaluation of patient's condition.  I assumed direction of critical care for this patient from another provider in my specialty: no    This document was prepared using Dragon voice recognition software and may include unintentional dictation errors.    Ottie Glazier, M.D.  Division of [facility]

## 2018-09-09 NOTE — Progress Notes (Signed)
CRITICAL CARE NOTE    HPI   This is a pleasant 85 year old male with a history of ILD and over 30 years of smoking, with chronic hypoxemia requiring 2 L/min nasal cannula which was recently increased to 4 L in the last week. He is followed by Dr. Kizzie Ide pulmonology in [place].  Also has a history of dyslipidemia essential hypertension hypothyroidism.  He is disoriented with confusion during interview and many details of recent history were provided by daughter at bedside.  Patient recently underwent permanent pacemaker insertion due to bradycardia in the 20s which was uneventful.  Daughter reports postoperative.  Has been uncomplicated with no complaints of chest pain or discomfort.  She relates that patient started to complain of unusual fatigue over the last week and some symptoms of urinary frequency concerning for UTI.  She had also noted that he be came more confused which is unusual because at baseline he has been lucid without dementia.  She denies that patient had chest pain, fevers, flulike illness, cough, diarrhea, sick contacts or travel.  In the ED patient was noted to be disoriented with elevated troponin over 17 and was admitted for acute on chronic hypoxemic respiratory failure with acute coronary syndrome non-STEMI.    SUBJECTIVE  Acute delirium overnight. Given 2.5 mg of haldol, 100mg  of trazodone and 1 mg of morphine with no effect. Started on precedex but became hypotensive hence infusion discontinued. Now sleeping. Remains of high flow. Required BiPAP overnight but will not leave mask on as he was severely agitated. Fluids offered and UO optimal. Troponins peaked at 55.99 . Remains on heparin infusion.   PHYSICAL EXAMINATION   Vitals:   09/09/18 0615 09/09/18 0645  BP: (!) 66/47 (!) 69/46  Pulse: 61 61   Resp: (!) 26 (!) 25  Temp:    SpO2: (!) 89% 96%    GENERAL: Restless HEAD: Normocephalic, atraumatic.  EYES: Pupils equal, round, reactive to light.  No scleral icterus.  MOUTH: Moist mucosal membrane. NECK: Supple. No thyromegaly. No nodules. No JVD.  PULMONARY: Decreased breath sounds bilaterally no wheezing no rhonchi. CARDIOVASCULAR: S1 and S2. Regular rate and rhythm. No murmurs, rubs, or gallops.  GASTROINTESTINAL: Soft, nontender, non-distended. No masses. Positive bowel sounds. No hepatosplenomegaly.  MUSCULOSKELETAL: No swelling, clubbing, or edema.  NEUROLOGIC: awake, confused, does not follow commands, moves all extremities SKIN:intact,warm,dry   LABS AND IMAGING  LAB RESULTS: Recent Labs  Lab 08/19/2018 1232 08/30/2018 1939 09/09/18 0143  NA 136 141 139  K 5.4* 4.5 4.4  CL 101 105 104  CO2 24 24 22   BUN 42* 42* 44*  CREATININE 1.83* 1.67* 1.58*  GLUCOSE 163* 127* 123*   Recent Labs  Lab 09/06/18 1011 09/04/2018 1232 09/09/18 0143  HGB 12.6* 13.3 13.0  HCT 38.3* 40.5 39.2  WBC 7.2 13.3* 14.3*  PLT 144* 168 159     IMAGING RESULTS: Dg Chest Portable 1 View  Result Date: 08/20/2018 CLINICAL DATA:  Fever last night. Shortness of breath. Patient status post pacemaker placement 09/06/2018. EXAM: PORTABLE CHEST 1 VIEW COMPARISON:  Single-view of the chest 09/06/2018 and 01/01/2018. CT chest 03/20/2018. FINDINGS: Changes of interstitial lung disease are again seen. There is new airspace disease in the right upper lobe. Heart size is normal. Pacing device is in place with leads projecting in the right atrium and right ventricle. No pneumothorax or pleural effusion. IMPRESSION: Increased airspace opacity in the right upper lobe most worrisome for pneumonia superimposed on chronic interstitial lung disease. Electronically Signed  By: Inge Rise M.D.   On: 09/14/2018 13:13    09/07/2018 -cxr    09/06/19-cxr   CT chest 03/2018 - advanced ILD with traction  bronchiectasis     ASSESSMENT AND PLAN    -Multidisciplinary rounds held today  Acute on chronic Hypoxic Respiratory Failure - possible pneumonia with concomitant chronic ILD and traction bronchiectasis -Continue empiric antibiotics for community acquired pneumonia -Duoneb Q 6H -Unable to tolerate BiPAP -Continue HFNC with SPO2 goal 88-94% -Wean Fio2 and PEEP as tolerated  CARDIAC FAILURE- ACS - NSTEMI -Cardiology on case-Dr. Sidney Ace case- appreciate recommendations -oxygen as needed -follow up cardiac enzymes as indicated ICU monitoring   Renal Failure-acute kidney injury stage II -Possibly due to ATN versus prerenal azotemia secondary to decreased p.o. intake with dehydration -follow chem 7 -follow UO -continue Foley Catheter-assess need daily -Corrects electrolytes  NEUROLOGY -Patient is currently with altered mentation including confusion possibly secondary to dehydration versus metabolic encephalopathy versus infectious etiology  ID -continue IV abx as prescibed -follow up cultures-blood cultures obtained in ED x2 results pending, we will do a urinalysis and urine culture due to complaints of urinary frequency and fatigue in the past week  GI/Nutrition GI PROPHYLAXIS as indicated DIET-->TF's as tolerated Constipation protocol as indicated  ENDO - ICU hypoglycemic\Hyperglycemia protocol -check FSBS per protocol   ELECTROLYTES -follow labs as needed-hyperkalemia -Replace as needed -pharmacy consultation   DVT/GI PRX ordered -SCDs  TRANSFUSIONS AS NEEDED MONITOR FSBS ASSESS the need for LABS as needed  Otha Monical S. Tukov-Yual ANP-BC Pulmonary and [place] Pager [phone] or [phone]  NB: This document was prepared using Dragon voice recognition software and may include unintentional dictation errors.

## 2018-09-10 DIAGNOSIS — Z515 Encounter for palliative care: Secondary | ICD-10-CM

## 2018-09-10 DIAGNOSIS — Z66 Do not resuscitate: Secondary | ICD-10-CM

## 2018-09-10 DIAGNOSIS — J9601 Acute respiratory failure with hypoxia: Secondary | ICD-10-CM

## 2018-09-10 DIAGNOSIS — R0609 Other forms of dyspnea: Secondary | ICD-10-CM

## 2018-09-10 LAB — BASIC METABOLIC PANEL
Anion gap: 9 (ref 5–15)
BUN: 51 mg/dL — ABNORMAL HIGH (ref 8–23)
CALCIUM: 8.4 mg/dL — AB (ref 8.9–10.3)
CO2: 22 mmol/L (ref 22–32)
Chloride: 111 mmol/L (ref 98–111)
Creatinine, Ser: 1.4 mg/dL — ABNORMAL HIGH (ref 0.61–1.24)
GFR calc Af Amer: 48 mL/min — ABNORMAL LOW (ref >60)
GFR, EST NON AFRICAN AMERICAN: 42 mL/min — AB (ref >60)
Glucose, Bld: 101 mg/dL — ABNORMAL HIGH (ref 70–99)
Potassium: 4.3 mmol/L (ref 3.5–5.1)
Sodium: 142 mmol/L (ref 135–145)

## 2018-09-10 LAB — CBC
HCT: 36.3 % — ABNORMAL LOW (ref 39.0–52.0)
Hemoglobin: 11.5 g/dL — ABNORMAL LOW (ref 13.0–17.0)
MCH: 32.9 pg (ref 26.0–34.0)
MCHC: 31.7 g/dL (ref 30.0–36.0)
MCV: 103.7 fL — ABNORMAL HIGH (ref 80.0–100.0)
PLATELETS: 157 10*3/uL (ref 150–400)
RBC: 3.5 MIL/uL — ABNORMAL LOW (ref 4.22–5.81)
RDW: 13.6 % (ref 11.5–15.5)
WBC: 8 10*3/uL (ref 4.0–10.5)
nRBC: 0 % (ref 0.0–0.2)

## 2018-09-10 LAB — URINE CULTURE: Culture: NO GROWTH

## 2018-09-10 LAB — PHOSPHORUS: Phosphorus: 3.1 mg/dL (ref 2.5–4.6)

## 2018-09-10 LAB — TROPONIN I: Troponin I: 19.07 ng/mL (ref <0.03)

## 2018-09-10 LAB — MAGNESIUM: Magnesium: 2.3 mg/dL (ref 1.7–2.4)

## 2018-09-10 LAB — HEPARIN LEVEL (UNFRACTIONATED): Heparin Unfractionated: 0.34 IU/mL (ref 0.30–0.70)

## 2018-09-10 MED ORDER — LORAZEPAM 2 MG/ML IJ SOLN: 1.0000 mg | Freq: Four times a day (QID) | INTRAMUSCULAR | Status: DC | PRN

## 2018-09-10 MED ORDER — IPRATROPIUM-ALBUTEROL 0.5-2.5 (3) MG/3ML IN SOLN
3.0000 mL | RESPIRATORY_TRACT | Status: DC
  Administered 2018-09-10 – 2018-09-11 (×4): 3 mL via RESPIRATORY_TRACT
  Filled 2018-09-10 (×4): qty 3

## 2018-09-10 MED ORDER — AZITHROMYCIN 500 MG PO TABS: 500.0000 mg | ORAL_TABLET | Freq: Every day | ORAL | Status: DC

## 2018-09-10 MED ORDER — IPRATROPIUM-ALBUTEROL 0.5-2.5 (3) MG/3ML IN SOLN: 3.0000 mL | Freq: Four times a day (QID) | RESPIRATORY_TRACT | Status: DC | PRN

## 2018-09-10 MED ORDER — IPRATROPIUM-ALBUTEROL 0.5-2.5 (3) MG/3ML IN SOLN: 3.0000 mL | Freq: Two times a day (BID) | RESPIRATORY_TRACT | Status: DC

## 2018-09-10 MED ORDER — MORPHINE SULFATE (PF) 2 MG/ML IV SOLN
1.0000 mg | INTRAVENOUS | Status: DC | PRN
  Administered 2018-09-11: 1 mg via INTRAVENOUS
  Filled 2018-09-10: qty 1

## 2018-09-10 NOTE — Plan of Care (Signed)
Pt is compliant with medical regiments.

## 2018-09-10 NOTE — Progress Notes (Signed)
SUBJECTIVE FINDINGS & SIGNIFICANT EVENTS   Remains on precedex +delerium +STEMI Elevated tropinins  Prognosis is very poor DNR/DNI     MEDICATIONS   Current Medication:  Current Facility-Administered Medications:  .  0.9 %  sodium chloride infusion, 250 mL, Intravenous, PRN, Dustin Flock, MD .  acetaminophen (TYLENOL) tablet 650 mg, 650 mg, Oral, Q6H PRN, 650 mg at 09/10/18 0218 **OR** acetaminophen (TYLENOL) suppository 650 mg, 650 mg, Rectal, Q6H PRN, Dustin Flock, MD .  acetaminophen (TYLENOL) tablet 975 mg, 975 mg, Oral, TID PRN, Dustin Flock, MD .  amLODipine (NORVASC) tablet 5 mg, 5 mg, Oral, Daily, Dustin Flock, MD, 5 mg at 08/19/2018 1733 .  aspirin EC tablet 81 mg, 81 mg, Oral, Daily, Dustin Flock, MD, 81 mg at 09/10/2018 1733 .  azithromycin (ZITHROMAX) 500 mg in sodium chloride 0.9 % 250 mL IVPB, 500 mg, Intravenous, Q24H, Aleskerov, Fuad, MD, Last Rate: 250 mL/hr at 09/09/18 1602, 500 mg at 09/09/18 1602 .  cefTRIAXone (ROCEPHIN) 1 g in sodium chloride 0.9 % 100 mL IVPB, 1 g, Intravenous, Q24H, Aleskerov, Fuad, MD, Last Rate: 200 mL/hr at 09/09/18 1715, 1 g at 09/09/18 1715 .  chlorhexidine (PERIDEX) 0.12 % solution 15 mL, 15 mL, Mouth Rinse, BID, Lanney Gins, Fuad, MD, 15 mL at 09/09/18 1957 .  cholecalciferol (VITAMIN D) tablet 1,000 Units, 1,000 Units, Oral, Daily, Dustin Flock, MD .  heparin ADULT infusion 100 units/mL (25000 units/218mL sodium chloride 0.45%), 1,000 Units/hr, Intravenous, Continuous, Dustin Flock, MD, Last Rate: 10 mL/hr at 09/10/18 0700, 1,000 Units/hr at 09/10/18 0700 .  ipratropium-albuterol (DUONEB) 0.5-2.5 (3) MG/3ML nebulizer solution 3 mL, 3 mL, Nebulization, Q4H, Tukov-Yual, Magdalene S, NP, 3 mL at 09/10/18 0728 .  levothyroxine (SYNTHROID, LEVOTHROID) tablet 137 mcg, 137 mcg, Oral, QAC breakfast, Dustin Flock, MD, 137 mcg at 09/10/18 0510 .  MEDLINE mouth rinse, 15 mL, Mouth Rinse, q12n4p, Aleskerov, Fuad, MD,  15 mL at 09/09/18 2158 .  mometasone-formoterol (DULERA) 200-5 MCG/ACT inhaler 2 puff, 2 puff, Inhalation, BID, Dustin Flock, MD, 2 puff at 09/01/2018 2005 .  multivitamin with minerals tablet 1 tablet, 1 tablet, Oral, Daily, Dustin Flock, MD .  ondansetron (ZOFRAN) tablet 4 mg, 4 mg, Oral, Q6H PRN **OR** ondansetron (ZOFRAN) injection 4 mg, 4 mg, Intravenous, Q6H PRN, Dustin Flock, MD, 4 mg at 09/09/18 0513 .  phenylephrine (NEO-SYNEPHRINE) 10 mg in sodium chloride 0.9 % 250 mL (0.04 mg/mL) infusion, 0-400 mcg/min, Intravenous, Titrated, Tukov-Yual, Magdalene S, NP, Last Rate: 15 mL/hr at 09/10/18 0700, 10 mcg/min at 09/10/18 0700 .  sodium chloride flush (NS) 0.9 % injection 3 mL, 3 mL, Intravenous, Q12H, Dustin Flock, MD, 3 mL at 09/09/18 2123 .  sodium chloride flush (NS) 0.9 % injection 3 mL, 3 mL, Intravenous, PRN, Dustin Flock, MD .  tamsulosin (FLOMAX) capsule 0.4 mg, 0.4 mg, Oral, QHS, Dustin Flock, MD, 0.4 mg at 09/09/18 2121 .  vitamin B-12 (CYANOCOBALAMIN) tablet 1,000 mcg, 1,000 mcg, Oral, Daily, Dustin Flock, MD  Facility-Administered Medications Ordered in Other Encounters:  .  albuterol (PROVENTIL) (2.5 MG/3ML) 0.083% nebulizer solution 2.5 mg, 2.5 mg, Nebulization, Once, McQuaid, Ronie Spies, MD    ALLERGIES   Diphenhydramine hcl and Simvastatin    REVIEW OF SYSTEMS  PATIENT IS UNABLE TO PROVIDE COMPLETE REVIEW OF SYSTEM S DUE TO SEVERE CRITICAL ILLNESS AND ENCEPHALOPATHY    Physical Examination:   GENERAL:RASS -1 HEAD: Normocephalic, atraumatic.  EYES: PERLA, EOMI No scleral icterus.  MOUTH: Moist mucosal membrane.  EAR, NOSE, THROAT: Clear without exudates. No external lesions.  NECK: Supple. No thyromegaly.  No JVD.  PULMONARY: CTA B/L no wheezing, rhonchi, crackles CARDIOVASCULAR: S1 and S2. Regular rate and rhythm. No murmurs GASTROINTESTINAL: Soft, nontender, nondistended. Positive bowel sounds.  MUSCULOSKELETAL: No swelling, clubbing, or  edema.  NEUROLOGIC: No gross focal neurological deficits. 5/5 strength all extremities SKIN: No ulceration, lesions, rashes, or cyanosis.  PSYCHIATRIC:lethargic ALL OTHER ROS ARE NEGATIVE       LABS AND IMAGING      LAB RESULTS: Recent Labs  Lab 09/10/2018 1939 09/09/18 0143 09/10/18 0528  NA 141 139 142  K 4.5 4.4 4.3  CL 105 104 111  CO2 24 22 22   BUN 42* 44* 51*  CREATININE 1.67* 1.58* 1.40*  GLUCOSE 127* 123* 101*   Recent Labs  Lab 08/21/2018 1232 09/09/18 0143 09/10/18 0528  HGB 13.3 13.0 11.5*  HCT 40.5 39.2 36.3*  WBC 13.3* 14.3* 8.0  PLT 168 159 157     IMAGING RESULTS: US Venous Img Lower Bilateral  Result Date: 09/09/2018 CLINICAL DATA:  Pulmonary embolism. EXAM: BILATERAL LOWER EXTREMITY VENOUS DOPPLER ULTRASOUND TECHNIQUE: Gray-scale sonography with graded compression, as well as color Doppler and duplex ultrasound were performed to evaluate the lower extremity deep venous systems from the level of the common femoral vein and including the common femoral, femoral, profunda femoral, popliteal and calf veins including the posterior tibial, peroneal and gastrocnemius veins when visible. The superficial great saphenous vein was also interrogated. Spectral Doppler was utilized to evaluate flow at rest and with distal augmentation maneuvers in the common femoral, femoral and popliteal veins. COMPARISON:  None. FINDINGS: RIGHT LOWER EXTREMITY Common Femoral Vein: No evidence of thrombus. Normal compressibility, respiratory phasicity and response to augmentation. Saphenofemoral Junction: No evidence of thrombus. Normal compressibility and flow on color Doppler imaging. Profunda Femoral Vein: No evidence of thrombus. Normal compressibility and flow on color Doppler imaging. Femoral Vein: No evidence of thrombus. Normal compressibility, respiratory phasicity and response to augmentation. Popliteal Vein: No evidence of thrombus. Normal compressibility, respiratory phasicity  and response to augmentation. Calf Veins: No obvious evidence of thrombus. Visualization limited by overlying soft tissue edema. Superficial Great Saphenous Vein: No evidence of thrombus. Normal compressibility. Venous Reflux:  None. Other Findings:  None. LEFT LOWER EXTREMITY Common Femoral Vein: No evidence of thrombus. Normal compressibility, respiratory phasicity and response to augmentation. Saphenofemoral Junction: No evidence of thrombus. Normal compressibility and flow on color Doppler imaging. Profunda Femoral Vein: No evidence of thrombus. Normal compressibility and flow on color Doppler imaging. Femoral Vein: No evidence of thrombus. Normal compressibility, respiratory phasicity and response to augmentation. Popliteal Vein: No evidence of thrombus. Normal compressibility, respiratory phasicity and response to augmentation. Calf Veins: No obvious evidence of thrombus. Visualization limited by overlying soft tissue edema. Superficial Great Saphenous Vein: No evidence of thrombus. Normal compressibility. Venous Reflux:  None. Other Findings:  None. IMPRESSION: No evidence of deep venous thrombosis in either lower extremity. Electronically Signed   By: Franki Cabot M.D.   On: 09/09/2018 12:41     TTE-09/09/18  1. The left ventricle has mild-moderately reduced systolic function, with an ejection fraction of 35 to 40%. The cavity size was normal. Hypokinesis of the inferior wall. Left ventricular diastolic Doppler parameters are consistent with  pseudonormalization.  2. The right ventricle has normal systolic function. The cavity was midlly dilated. There is no increase in right ventricular wall thickness. Right ventricular systolic pressure is mild to moderately elevated with an  estimated pressure of 44.1 mmHg.  3. Left atrial size was moderately dilated.  4. Challenging image quality   ASSESSMENT AND PLAN    Acute on chronic Hypoxic Respiratory Failure - possible pneumonia with concomitant  chronic ILD and traction bronchiectasis with CHF exacerbation and STEMI  BiPAP as needed -continue Bronchodilator Therapy -Wean Fio2    CARDIAC FAILURE- ACS - STEMI No intervention at this time Prognosis is poor -History of the left main and three-vessel coronary artery disease high risk for cardiac cath.   high-grade AV block status post permanent pacemaker placement,  Follow up Enzymes Follow up cardiology recs   ACUTE KIDNEY INJURY/Renal Failure -follow chem 7 -follow UO -continue Foley Catheter-assess need  NEUROLOGY Severe delirium precedex to be weaned off as tolerated   ID -continue IV abx as prescibed -follow up cultures-blood cultures obtained in ED x2 results pending, we will do a urinalysis and urine culture due to complaints of urinary frequency and fatigue in the past week  GI/Nutrition GI PROPHYLAXIS as indicated DIET-->NPO Constipation protocol as indicated  ENDO - ICU hypoglycemic\Hyperglycemia protocol -check FSBS per protocol   ELECTROLYTES -follow labs as needed-hyperkalemia -replace as needed -pharmacy consultation   DVT/GI PRX ordered -SCDs  TRANSFUSIONS AS NEEDED MONITOR FSBS ASSESS the need for LABS as needed     Critical Care Time devoted to patient care services described in this note is 34  minutes.   Overall, patient is critically ill, prognosis is guarded.  Patient with Multiorgan failure and at high risk for cardiac arrest and death.    Corrin Parker, M.D.  [facility]  Medical Director [place] Director [facility] Cardio-Pulmonary Department

## 2018-09-10 NOTE — Progress Notes (Signed)
Discussed with nursing and reviewed the chart.  Patient continues to have intermittent agitation and confusion.  Palliative care consult is pending. The patient had a large myocardial infarction likely an inferior STEMI.  He had a recent pacemaker placement.  It is possible that he did have myocardial infarction in his previous presentation complicated by complete heart block. Given the patient's age and comorbidities, he is not a candidate for invasive cardiac work-up.  Continue supportive care and consider comfort measures if no improvement in symptoms in the next few days.

## 2018-09-10 NOTE — Progress Notes (Addendum)
[facility] NAME: Paul Cross    MR#:  [id number]  DATE OF BIRTH:  [birth year]  SUBJECTIVE:  CHIEF COMPLAINT:   Chief Complaint  Patient presents with  . Shortness of Breath   The patient is still confused, on HF O2, off Levophed and precedex. REVIEW OF SYSTEMS:  Review of Systems  Unable to perform ROS: Mental status change    DRUG ALLERGIES:   Allergies  Allergen Reactions  . Diphenhydramine Hcl Other (See Comments)    Reaction:  Unknown   . Simvastatin Other (See Comments)    Reaction:  Unknown    VITALS:  Blood pressure 115/61, pulse 76, temperature 98.5 F (36.9 C), temperature source Axillary, resp. rate (!) 25, height 6' (1.829 m), weight 86 kg, SpO2 (!) 89 %. PHYSICAL EXAMINATION:  Physical Exam Constitutional:      General: He is not in acute distress. HENT:     Head: Normocephalic.  Eyes:     General: No scleral icterus.    Conjunctiva/sclera: Conjunctivae normal.  Neck:     Musculoskeletal: Neck supple.     Vascular: No JVD.     Trachea: No tracheal deviation.  Cardiovascular:     Rate and Rhythm: Normal rate and regular rhythm.     Heart sounds: Normal heart sounds. No murmur. No gallop.   Pulmonary:     Effort: Pulmonary effort is normal. No respiratory distress.     Breath sounds: No wheezing.     Comments: Crackles. Abdominal:     General: Bowel sounds are normal. There is no distension.     Palpations: Abdomen is soft.     Tenderness: There is no abdominal tenderness. There is no rebound.  Musculoskeletal:        General: No tenderness.     Right lower leg: No edema.     Left lower leg: No edema.  Skin:    Findings: No erythema or rash.  Neurological:     Comments: Unable to exam.    LABORATORY PANEL:  Male CBC Recent Labs  Lab 09/10/18 0528  WBC 8.0  HGB 11.5*  HCT 36.3*  PLT 157    ------------------------------------------------------------------------------------------------------------------ Chemistries  Recent Labs  Lab 08/27/2018 1232  09/10/18 0528  NA 136   < > 142  K 5.4*   < > 4.3  CL 101   < > 111  CO2 24   < > 22  GLUCOSE 163*   < > 101*  BUN 42*   < > 51*  CREATININE 1.83*   < > 1.40*  CALCIUM 9.5   < > 8.4*  MG  --    < > 2.3  AST 236*  --   --   ALT 102*  --   --   ALKPHOS 44  --   --   BILITOT 2.2*  --   --    < > = values in this interval not displayed.   RADIOLOGY:  No results found. ASSESSMENT AND PLAN:   Patient is 85 year old with chronic respiratory failure requiring 2 L oxygen due to interstitial lung disease presenting with worsening shortness of breath  1.  Acute on chronic respiratory failure with hypoxia. Unable to exclude hospital-acquired pneumonia. Off BiPAP Continue HF O2. NEB. Continue zithromax and rocephin.  Leukocytosis improved, blood cultures negative so far.  2.  Non-STEMI  Continue IV heparin drip. Follow up echo and Dr. Donivan Scull recommendation. Echo: ejection fraction of  35 to 40%. Per Dr. Fletcher Anon, he is not a candidate for invasive cardiac work-up.  Continue supportive care and consider comfort measures if no improvement in symptoms in the next few days.  3.  Chronic interstitial lung disease. supportive care and oxygen therapy  4.  Acute kidney injury on CKD stage 3. Follow up BMP.  Acute metabolic encephalopathy. Due to above.  5.  BPH continue Flomax  6.  Essential hypertension. Hold Norvasc due to hypotension.  Hypotension, improved, off Levophed drip.  7.  Hypothyroidism continue Synthroid  All the records are reviewed and case discussed with Care Management/Social Worker. Management plans discussed with the patient, family and they are in agreement.  CODE STATUS: DNR  TOTAL TIME TAKING CARE OF THIS PATIENT: 32  minutes.   More than 50% of the time was spent in counseling/coordination of  care: YES  POSSIBLE D/C IN ? DAYS, DEPENDING ON CLINICAL CONDITION.   Demetrios Loll M.D on 09/10/2018 at 3:04 PM  Between 7am to 6pm - Pager - [phone]  After 6pm go to [email or web address] - [facility]

## 2018-09-10 NOTE — Progress Notes (Signed)
Pastoral Care Visit    09/10/18 1000  Clinical Encounter Type  Visited With Patient  Visit Type Follow-up;Psychological support;Spiritual support  Referral From Chaplain  Consult/Referral To Chaplain  Spiritual Encounters  Spiritual Needs Prayer  Stress Factors  Patient Stress Factors Health changes   Pt was resting when [place] visited but awakened with verbal greeting.  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  "I have lived a blessed life."  Pt was thankful for his breakfast and just in an all around pleasant mood. Chap listened to pt and prayed with him.  Darcey Nora, Chaplain

## 2018-09-10 NOTE — Consult Note (Signed)
Consultation Note Date: 09/10/2018   Patient Name: Paul Cross  DOB: 1918/06/17  MRN: [id number]  Age / Sex: 85 y.o., male  PCP: Paul Haven, MD Referring Physician: Demetrios Loll, MD  Reason for Consultation: Establishing goals of care and Psychosocial/spiritual support  HPI/Patient Profile: 85 y.o. male  admitted on 08/30/2018 with significant past medical history of hyperlipidemia, hypertension, hypothyroidism who was not feeling well for the past 1 week and was diagnosed with sick sinus syndrome.   Underwent a pacemaker placement at [facility] on Thursday.  Patient was discharged to rehab.   Since last night he started getting more confused according to the daughter and also short of breath.  Patient is brought to the emergency room and is noted to have pneumonia as well as possible non-ST MI.    Currently in the ICU on HFNC.  He is alert and oriented.  Patient and his daughter face treatment option decisions, advanced directive decisions and anticipatory  care needs   Clinical Assessment and Goals of Care:  This NP Paul Cross reviewed medical records, received report from team, assessed the patient and then meet at the patient's bedside  to discuss diagnosis, prognosis, GOC, EOL wishes disposition and options.  Concept of Hospice and Palliative Care were discussed  A detailed discussion was had today regarding advanced directives.  Concepts specific to code status, artifical feeding and hydration, continued IV antibiotics and rehospitalization was had.  The difference between a aggressive medical intervention path  and a palliative comfort care path for this patient at this time was had.  Values and goals of care important to patient and family were attempted to be elicited.  Patient was alert and oriented and was able to verbalize his understanding of his current medical  condition and context of his advanced age and mortality.  Patient was a Dietitian for 30 years and is very comfortable talking about death.  " I want to live as long as I possibly can as long as I have my health".      I then had a telephone conference with the patient's daughter Paul Cross and discussed the above. Daughter feels strongly that comfort is a priority for her father.  He has expressed his wishes and documented in a living well according to his daughter to reflect comfort   Decision is to continue current medical interventions for the next 24 hours and reevaluate.  If however the patient begins to decompensate the plan is to shift to a more comfort approach natural death.  Natural trajectory and expectations at EOL were discussed.  Questions and concerns addressed.   Family encouraged to call with questions or concerns.    PMT will continue to support holistically.   NEXT OF KIN/ Daughter/ Paul Cross    SUMMARY OF RECOMMENDATIONS    Continue current treatment plan for the next 24 hours, family is hopeful for improvement.           However if the patient begins to decompensate a  shift to comfort is desired; and utilize prescribed medication to enhance comfort.  Comfort, quality and dignity are a priority at this time.  Palliative  medicine team will follow-up in the morning.  Code Status/Advance Care Planning:  DNR   Symptom Management:   Dyspnea: Morphine IV 1 mg every 3 hours as needed for dyspnea/air hunger  Palliative Prophylaxis:   Aspiration, Bowel Regimen, Delirium Protocol, Frequent Pain Assessment and Oral Care   Psycho-social/Spiritual:   Desire for further Chaplaincy support:yes  Additional Recommendations: Education on Hospice  Prognosis:   Unable to determine  Discharge Planning: To Be Determined      Primary Diagnoses: Present on Admission: . Acute respiratory failure ([place])   I have reviewed the medical record, interviewed the  patient and family, and examined the patient. The following aspects are pertinent.  Past Medical History:  Diagnosis Date  . Hyperlipidemia   . Hypertension   . Hypothyroidism    approximately 2009  . ILD (interstitial lung disease) ([place])    Social History   Socioeconomic History  . Marital status: Widowed    Spouse name: Not on file  . Number of children: Not on file  . Years of education: Not on file  . Highest education level: Not on file  Occupational History  . Not on file  Social Needs  . Financial resource strain: Not on file  . Food insecurity:    Worry: Not on file    Inability: Not on file  . Transportation needs:    Medical: Not on file    Non-medical: Not on file  Tobacco Use  . Smoking status: Former Research scientist (life sciences)  . Smokeless tobacco: Former Network engineer and Sexual Activity  . Alcohol use: No  . Drug use: No  . Sexual activity: Not on file  Lifestyle  . Physical activity:    Days per week: Not on file    Minutes per session: Not on file  . Stress: Not on file  Relationships  . Social connections:    Talks on phone: Not on file    Gets together: Not on file    Attends religious service: Not on file    Active member of club or organization: Not on file    Attends meetings of clubs or organizations: Not on file    Relationship status: Not on file  Other Topics Concern  . Not on file  Social History Narrative  . Not on file   Family History  Problem Relation Age of Onset  . Cancer Sister        breast  . Diabetes Brother   . Heart disease Brother   . Stroke Sister   . Depression Daughter    Scheduled Meds: . aspirin EC  81 mg Oral Daily  . chlorhexidine  15 mL Mouth Rinse BID  . cholecalciferol  1,000 Units Oral Daily  . ipratropium-albuterol  3 mL Nebulization Q4H  . levothyroxine  137 mcg Oral QAC breakfast  . mouth rinse  15 mL Mouth Rinse q12n4p  . mometasone-formoterol  2 puff Inhalation BID  . multivitamin with minerals  1 tablet Oral  Daily  . sodium chloride flush  3 mL Intravenous Q12H  . tamsulosin  0.4 mg Oral QHS  . vitamin B-12  1,000 mcg Oral Daily   Continuous Infusions: . sodium chloride    . azithromycin 500 mg (09/09/18 1602)  . cefTRIAXone (ROCEPHIN)  IV 1 g (09/09/18 1715)  . heparin 1,000 Units/hr (09/10/18 1200)  .  phenylephrine (NEO-SYNEPHRINE) Adult infusion Stopped (09/10/18 0842)   PRN Meds:.sodium chloride, acetaminophen **OR** acetaminophen, acetaminophen, ipratropium-albuterol, ondansetron **OR** ondansetron (ZOFRAN) IV, sodium chloride flush Medications Prior to Admission:  Prior to Admission medications   Medication Sig Start Date End Date Taking? Authorizing Provider  acetaminophen (TYLENOL) 325 MG tablet Take 975 mg by mouth 3 (three) times daily as needed for mild pain, moderate pain, fever or headache.   Yes [provider]  amLODipine (NORVASC) 10 MG tablet Take 5 mg by mouth daily.    Yes [provider]  aspirin EC 81 MG tablet Take 81 mg by mouth daily.   Yes [provider]  budesonide-formoterol (SYMBICORT) 160-4.5 MCG/ACT inhaler Inhale 2 puffs into the lungs 2 (two) times daily.   Yes [provider]  cholecalciferol (VITAMIN D) 1000 units tablet Take 1,000 Units by mouth daily.   Yes [provider]  hydrocortisone cream 1 % Apply 1 application topically 2 (two) times daily. FOR TAILBONE   Yes [provider]  levothyroxine (SYNTHROID, LEVOTHROID) 137 MCG tablet Take 137 mcg by mouth daily before breakfast.    Yes [provider]  Multiple Vitamin (MULTIVITAMIN) capsule Take 1 capsule by mouth daily.   Yes [provider]  tamsulosin (FLOMAX) 0.4 MG CAPS capsule Take 1 capsule (0.4 mg total) by mouth at bedtime. 09/07/18  Yes Baldwin Jamaica, PA-C  tiotropium (SPIRIVA HANDIHALER) 18 MCG inhalation capsule Place 1 capsule (18 mcg total) into inhaler and inhale daily. 02/01/18 02/01/19 Yes Young, Clinton D, MD   Tiotropium Bromide Monohydrate (SPIRIVA RESPIMAT) 2.5 MCG/ACT AERS Inhale 2 puffs into the lungs daily. 03/28/18  Yes Juanito Doom, MD  vitamin B-12 (CYANOCOBALAMIN) 1000 MCG tablet Take 1,000 mcg by mouth daily.   Yes [provider]   Allergies  Allergen Reactions  . Diphenhydramine Hcl Other (See Comments)    Reaction:  Unknown   . Simvastatin Other (See Comments)    Reaction:  Unknown    Review of Systems  Respiratory: Positive for shortness of breath.   Neurological: Positive for weakness.    Physical Exam Constitutional:      Appearance: He is well-developed.     Interventions: Nasal cannula in place.  Cardiovascular:     Rate and Rhythm: Normal rate and regular rhythm.     Heart sounds: Normal heart sounds.  Musculoskeletal:     Comments: Generlized weakness  Skin:    General: Skin is warm and dry.  Neurological:     Mental Status: He is alert.  Psychiatric:        Attention and Perception: Attention normal.        Speech: Speech normal.        Thought Content: Thought content normal.        Cognition and Memory: Cognition normal.     Vital Signs: BP 115/61   Pulse 76   Temp 98.5 F (36.9 C) (Axillary)   Resp (!) 25   Ht 6' (1.829 m)   Wt 86 kg   SpO2 (!) 89%   BMI 25.71 kg/m  Pain Scale: 0-10 POSS *See Group Information*: S-Acceptable,Sleep, easy to arouse Pain Score: 0-No pain   SpO2: SpO2: (!) 89 % O2 Device:SpO2: (!) 89 % O2 Flow Rate: .O2 Flow Rate (L/min): 55 L/min  IO: Intake/output summary:   Intake/Output Summary (Last 24 hours) at 09/10/2018 1330 Last data filed at 09/10/2018 1200 Gross per 24 hour  Intake 1344.56 ml  Output 660 ml  Net 684.56 ml    LBM: Last BM Date: 09/10/2018 Baseline Weight: Weight: 86 kg Most recent weight: Weight: 86 kg     Palliative Assessment/Data:   Discussed with Dr Mortimer Fries  Time In: 1445 Time Out: 1600 Time Total: 75 minutes Greater than 50%  of this time was spent counseling and  coordinating care related to the above assessment and plan.  Signed by: Paul Lessen, NP   Please contact Palliative Medicine Team phone at [phone] for questions and concerns.  For individual provider: See Shea Evans

## 2018-09-10 NOTE — Progress Notes (Signed)
ANTICOAGULATION CONSULT NOTE  Pharmacy Consult for Heparin Drip Indication: chest pain/ACS  Patient Measurements: Height: 6' (182.9 cm) Weight: 189 lb 9.5 oz (86 kg) IBW/kg (Calculated) : 77.6 Heparin Dosing Weight: 84.9 kg  Vital Signs: Temp: 99.4 F (37.4 C) (03/23 0445) Temp Source: Axillary (03/23 0445) BP: 118/64 (03/23 0500) Pulse Rate: 75 (03/23 0500)  Labs: Recent Labs    08/27/2018 1232 09/13/2018 1410 09/18/2018 1939 09/09/18 0143 09/09/18 0943 09/10/18 0528  HGB 13.3  --   --  13.0  --  11.5*  HCT 40.5  --   --  39.2  --  36.3*  PLT 168  --   --  159  --  157  APTT 36  --   --   --   --   --   LABPROT 15.4*  --   --   --   --   --   INR 1.2  --   --   --   --   --   HEPARINUNFRC  --   --   --  0.44 0.37 0.34  CREATININE 1.83*  --  1.67* 1.58*  --   --   TROPONINI 17.44* 28.59* 55.99* 48.55*  --   --     Estimated Creatinine Clearance: 28.6 mL/min (A) (by C-G formula based on SCr of 1.58 mg/dL (H)).   Medical History: Past Medical History:  Diagnosis Date  . Hyperlipidemia   . Hypertension   . Hypothyroidism    approximately 2009  . ILD (interstitial lung disease) [facility])    Assessment: Patient is a 85yo male admitted with fever, patient had a pacemaker placed on 09/07/18. Pharmacy consulted for Heparin dosing for elevated troponin. Hgb, PLT wnl  Heparin Course 3/21 heparin infusion started @ 1000 units/hr 3/22 0143 HL 0.44 3/22 0943 HL 0.37 3/23 0528 HL 0.34  Goal of Therapy:  Heparin level 0.3-0.7 units/ml Monitor platelets by anticoagulation protocol: Yes   Plan:  3/23 @ 0528 HL: 0.34. Level remains therapeutic. Hgb/Hct/Plts stable. Will continue current infusion rate of heparin 1000 units/hr  --CBC and Anti-Xa levels daily while on heparin  Continue to monitor H&H and platelets   Pernell Dupre, PharmD, BCPS Clinical Pharmacist 09/10/2018 6:04 AM

## 2018-09-11 ENCOUNTER — Telehealth: Payer: Self-pay | Admitting: Internal Medicine

## 2018-09-11 DIAGNOSIS — Z66 Do not resuscitate: Secondary | ICD-10-CM

## 2018-09-11 DIAGNOSIS — R0600 Dyspnea, unspecified: Secondary | ICD-10-CM

## 2018-09-11 DIAGNOSIS — Z515 Encounter for palliative care: Secondary | ICD-10-CM

## 2018-09-11 MED ORDER — MORPHINE SULFATE (PF) 2 MG/ML IV SOLN
2.0000 mg | INTRAVENOUS | Status: AC
  Administered 2018-09-11: 2 mg via INTRAVENOUS
  Filled 2018-09-11: qty 1

## 2018-09-11 MED ORDER — POLYVINYL ALCOHOL 1.4 % OP SOLN
1.0000 [drp] | Freq: Four times a day (QID) | OPHTHALMIC | Status: DC | PRN
  Filled 2018-09-11: qty 15

## 2018-09-11 MED ORDER — GLYCOPYRROLATE 0.2 MG/ML IJ SOLN: 0.2000 mg | INTRAMUSCULAR | Status: DC | PRN

## 2018-09-11 MED ORDER — BIOTENE DRY MOUTH MT LIQD: 15.0000 mL | OROMUCOSAL | Status: DC | PRN

## 2018-09-11 MED ORDER — HALOPERIDOL LACTATE 5 MG/ML IJ SOLN: 0.5000 mg | INTRAMUSCULAR | Status: DC | PRN

## 2018-09-11 MED ORDER — MORPHINE SULFATE (PF) 2 MG/ML IV SOLN
1.0000 mg | INTRAVENOUS | Status: DC | PRN
  Administered 2018-09-11: 2 mg via INTRAVENOUS
  Filled 2018-09-11: qty 1

## 2018-09-13 LAB — CULTURE, BLOOD (ROUTINE X 2)
Culture: NO GROWTH
Culture: NO GROWTH
Special Requests: ADEQUATE
Special Requests: ADEQUATE

## 2018-09-13 NOTE — Telephone Encounter (Signed)
Death Certificate is completed and has been placed up front for pickup. Lattie Haw with [facility] is aware. I have made Lattie Haw aware that she will need to enter at the medical mall.  Nothing further is needed at this time.

## 2018-09-18 ENCOUNTER — Ambulatory Visit: Payer: Medicare Other

## 2018-09-19 NOTE — Telephone Encounter (Signed)
Death certificate has been placed in DK's folder.  

## 2018-09-19 NOTE — Progress Notes (Signed)
Pt removed from BiPAP per CMO order and MD request

## 2018-09-19 NOTE — Death Summary Note (Signed)
DEATH SUMMARY   Patient Details  Name: Paul Cross MRN: [id number] DOB: 1918-05-29  Admission/Discharge Information   Admit Date:  09-22-18  Date of Death: Date of Death: 2018-09-25  Time of Death: Time of Death: 0741  Length of Stay: 3  Referring Physician: Leone Haven, MD   Reason(s) for Hospitalization  Severe resp failure Placed on biPAP Patient was made DNR  Family updated-patient declined rapidly and suffering Patient transitioned to to comfort measures only  Diagnoses  Preliminary cause of death: pneumonia, ischemic cardiomyopathy,NSTEMI Secondary Diagnoses (including complications and co-morbidities):  Active Problems:   Acute respiratory failure ([facility])      Pertinent Labs and Studies  Significant Diagnostic Studies Dg Chest 2 View  Result Date: 09/07/2018 CLINICAL DATA:  Pacemaker. EXAM: CHEST - 2 VIEW COMPARISON:  No recent prior. FINDINGS: Cardiac pacer lead tip over the right atrium and right ventricle. Cardiomegaly. Diffuse bilateral from interstitial prominence. Interstitial changes could be chronic however an acute process including interstitial edema and/or pneumonitis could present in this fashion. No pleural effusion or pneumothorax. Diffuse thoracic spine osteopenia degenerative change. IMPRESSION: Cardiac pacer with lead tip over the right atrium and right ventricle. Cardiomegaly. Diffuse bilateral pulmonary interstitial prominence. Interstitial changes could be from chronic interstitial disease however an acute process including interstitial edema and/or pneumonitis could present this fashion. No pneumothorax. Electronically Signed   By: Marcello Moores  Register   On: 09/07/2018 06:25   Ct Head Wo Contrast  Result Date: 09/06/2018 CLINICAL DATA:  Dizziness today and yesterday. Bradycardia today. Status post fall yesterday. Initial encounter. EXAM: CT HEAD WITHOUT CONTRAST CT CERVICAL SPINE WITHOUT CONTRAST TECHNIQUE: Multidetector CT imaging of the  head and cervical spine was performed following the standard protocol without intravenous contrast. Multiplanar CT image reconstructions of the cervical spine were also generated. COMPARISON:  Brain MRI and head CT 04/18/2017. FINDINGS: CT HEAD FINDINGS Brain: No evidence of acute infarction, hemorrhage, hydrocephalus, extra-axial collection or mass lesion/mass effect. Atrophy and chronic microvascular ischemic change are noted. Vascular: Atherosclerosis is identified. Skull: Intact.  No focal lesion. Sinuses/Orbits: Negative. Other: None. CT CERVICAL SPINE FINDINGS Alignment: There is reversal of the normal cervical lordosis. Mild facet mediated anterolisthesis C3 on C4 and C4 on C5 noted. Skull base and vertebrae: No acute fracture. No primary bone lesion or focal pathologic process. Soft tissues and spinal canal: No prevertebral fluid or swelling. No visible canal hematoma. Disc levels: Loss of disc space height is worst at C5-6, C6-7 and C7-T1. Scattered facet arthropathy noted. Upper chest: Apical scar is present. Other: Atherosclerosis noted. IMPRESSION: No acute abnormality head or cervical spine. Atrophy and chronic microvascular ischemic change. Atherosclerosis. Cervical spondylosis. Electronically Signed   By: Inge Rise M.D.   On: 09/06/2018 11:31   Ct Cervical Spine Wo Contrast  Result Date: 09/06/2018 CLINICAL DATA:  Dizziness today and yesterday. Bradycardia today. Status post fall yesterday. Initial encounter. EXAM: CT HEAD WITHOUT CONTRAST CT CERVICAL SPINE WITHOUT CONTRAST TECHNIQUE: Multidetector CT imaging of the head and cervical spine was performed following the standard protocol without intravenous contrast. Multiplanar CT image reconstructions of the cervical spine were also generated. COMPARISON:  Brain MRI and head CT 04/18/2017. FINDINGS: CT HEAD FINDINGS Brain: No evidence of acute infarction, hemorrhage, hydrocephalus, extra-axial collection or mass lesion/mass effect. Atrophy  and chronic microvascular ischemic change are noted. Vascular: Atherosclerosis is identified. Skull: Intact.  No focal lesion. Sinuses/Orbits: Negative. Other: None. CT CERVICAL SPINE FINDINGS Alignment: There is reversal of the normal cervical lordosis. Mild  facet mediated anterolisthesis C3 on C4 and C4 on C5 noted. Skull base and vertebrae: No acute fracture. No primary bone lesion or focal pathologic process. Soft tissues and spinal canal: No prevertebral fluid or swelling. No visible canal hematoma. Disc levels: Loss of disc space height is worst at C5-6, C6-7 and C7-T1. Scattered facet arthropathy noted. Upper chest: Apical scar is present. Other: Atherosclerosis noted. IMPRESSION: No acute abnormality head or cervical spine. Atrophy and chronic microvascular ischemic change. Atherosclerosis. Cervical spondylosis. Electronically Signed   By: Inge Rise M.D.   On: 09/06/2018 11:31   US Venous Img Lower Bilateral  Result Date: 09/09/2018 CLINICAL DATA:  Pulmonary embolism. EXAM: BILATERAL LOWER EXTREMITY VENOUS DOPPLER ULTRASOUND TECHNIQUE: Gray-scale sonography with graded compression, as well as color Doppler and duplex ultrasound were performed to evaluate the lower extremity deep venous systems from the level of the common femoral vein and including the common femoral, femoral, profunda femoral, popliteal and calf veins including the posterior tibial, peroneal and gastrocnemius veins when visible. The superficial great saphenous vein was also interrogated. Spectral Doppler was utilized to evaluate flow at rest and with distal augmentation maneuvers in the common femoral, femoral and popliteal veins. COMPARISON:  None. FINDINGS: RIGHT LOWER EXTREMITY Common Femoral Vein: No evidence of thrombus. Normal compressibility, respiratory phasicity and response to augmentation. Saphenofemoral Junction: No evidence of thrombus. Normal compressibility and flow on color Doppler imaging. Profunda Femoral Vein:  No evidence of thrombus. Normal compressibility and flow on color Doppler imaging. Femoral Vein: No evidence of thrombus. Normal compressibility, respiratory phasicity and response to augmentation. Popliteal Vein: No evidence of thrombus. Normal compressibility, respiratory phasicity and response to augmentation. Calf Veins: No obvious evidence of thrombus. Visualization limited by overlying soft tissue edema. Superficial Great Saphenous Vein: No evidence of thrombus. Normal compressibility. Venous Reflux:  None. Other Findings:  None. LEFT LOWER EXTREMITY Common Femoral Vein: No evidence of thrombus. Normal compressibility, respiratory phasicity and response to augmentation. Saphenofemoral Junction: No evidence of thrombus. Normal compressibility and flow on color Doppler imaging. Profunda Femoral Vein: No evidence of thrombus. Normal compressibility and flow on color Doppler imaging. Femoral Vein: No evidence of thrombus. Normal compressibility, respiratory phasicity and response to augmentation. Popliteal Vein: No evidence of thrombus. Normal compressibility, respiratory phasicity and response to augmentation. Calf Veins: No obvious evidence of thrombus. Visualization limited by overlying soft tissue edema. Superficial Great Saphenous Vein: No evidence of thrombus. Normal compressibility. Venous Reflux:  None. Other Findings:  None. IMPRESSION: No evidence of deep venous thrombosis in either lower extremity. Electronically Signed   By: Franki Cabot M.D.   On: 09/09/2018 12:41   Dg Chest Portable 1 View  Result Date: 09/17/2018 CLINICAL DATA:  Fever last night. Shortness of breath. Patient status post pacemaker placement 09/06/2018. EXAM: PORTABLE CHEST 1 VIEW COMPARISON:  Single-view of the chest 09/06/2018 and 01/01/2018. CT chest 03/20/2018. FINDINGS: Changes of interstitial lung disease are again seen. There is new airspace disease in the right upper lobe. Heart size is normal. Pacing device is in place  with leads projecting in the right atrium and right ventricle. No pneumothorax or pleural effusion. IMPRESSION: Increased airspace opacity in the right upper lobe most worrisome for pneumonia superimposed on chronic interstitial lung disease. Electronically Signed   By: Inge Rise M.D.   On: 09/18/2018 13:13   Dg Chest Portable 1 View  Result Date: 09/06/2018 CLINICAL DATA:  Onset dizziness yesterday.  Status post fall. EXAM: PORTABLE CHEST 1 VIEW COMPARISON:  CT chest 03/20/2018.  Single-view of the chest 01/15/2018 and 01/01/2018. FINDINGS: Changes of chronic interstitial lung disease are again seen. No superimposed pneumonia or pulmonary edema. Heart size is upper normal. Atherosclerosis noted. No pneumothorax or pleural effusion. Defibrillator pads are in place. No acute bony abnormality. IMPRESSION: No acute abnormality. Chronic interstitial lung disease. Atherosclerosis. Electronically Signed   By: Inge Rise M.D.   On: 09/06/2018 10:42    Microbiology Recent Results (from the past 240 hour(s))  Urine culture     Status: None   Collection Time: 08/24/2018  1:44 PM  Result Value Ref Range Status   Specimen Description   Final    URINE, RANDOM Performed at [facility], [address], [place], [place] [address]    Special Requests   Final    NONE Performed at [facility], [address] [place], [place] [address]    Culture   Final    NO GROWTH Performed at [facility], [place] [address], [place], [place] [address]    Report Status 09/10/2018 FINAL  Final  Culture, blood (routine x 2)     Status: None (Preliminary result)   Collection Time: 08/25/2018  2:10 PM  Result Value Ref Range Status   Specimen Description BLOOD RAC  Final   Special Requests   Final    BOTTLES DRAWN AEROBIC AND ANAEROBIC Blood Culture adequate volume   Culture   Final    NO GROWTH 3 DAYS Performed at [facility], [address], [place], [place] [address]     Report Status PENDING  Incomplete  Culture, blood (routine x 2)     Status: None (Preliminary result)   Collection Time: 09/12/2018  2:10 PM  Result Value Ref Range Status   Specimen Description BLOOD L WRIST  Final   Special Requests   Final    BOTTLES DRAWN AEROBIC AND ANAEROBIC Blood Culture adequate volume   Culture   Final    NO GROWTH 3 DAYS Performed at [facility], [address], [place], [place] [address]    Report Status PENDING  Incomplete  MRSA PCR Screening     Status: None   Collection Time: 08/22/2018  2:52 PM  Result Value Ref Range Status   MRSA by PCR NEGATIVE NEGATIVE Final    Comment:        The GeneXpert MRSA Assay (FDA approved for NASAL specimens only), is one component of a comprehensive MRSA colonization surveillance program. It is not intended to diagnose MRSA infection nor to guide or monitor treatment for MRSA infections. Performed at [facility], [address] [place],  [address]     Lab Basic Metabolic Panel: Recent Labs  Lab 09/07/18 [phone] 09/02/2018 1232 08/30/2018 1939 09/09/18 0143 09/10/18 0528  NA 140 136 141 139 142  K 4.7 5.4* 4.5 4.4 4.3  CL 105 101 105 104 111  CO2 25 24 24 22 22   GLUCOSE 109* 163* 127* 123* 101*  BUN 31* 42* 42* 44* 51*  CREATININE 1.33* 1.83* 1.67* 1.58* 1.40*  CALCIUM 9.9 9.5 8.9 9.2 8.4*  MG  --   --  2.0 2.0 2.3  PHOS  --   --  3.7 3.6 3.1   Liver Function Tests: Recent Labs  Lab 09/06/18 1011 09/13/2018 1232  AST 18 236*  ALT 16 102*  ALKPHOS 35* 44  BILITOT 0.9 2.2*  PROT 6.8 7.6  ALBUMIN 3.9 4.0   No results for input(s): LIPASE, AMYLASE in the  last 168 hours. No results for input(s): AMMONIA in the last 168 hours. CBC: Recent Labs  Lab 09/06/18 1011 08/24/2018 1232 09/09/18 0143 09/10/18 0528  WBC 7.2 13.3* 14.3* 8.0  NEUTROABS 5.0 10.4*  --   --   HGB 12.6* 13.3 13.0 11.5*  HCT 38.3* 40.5 39.2 36.3*  MCV 100.3* 98.8 98.7 103.7*  PLT 144* 168 159 157    Cardiac Enzymes: Recent Labs  Lab 09/15/2018 1232 08/28/2018 1410 08/22/2018 1939 09/09/18 0143 09/10/18 0528  TROPONINI 17.44* 28.59* 55.99* 48.55* 19.07*   Sepsis Labs: Recent Labs  Lab 09/06/18 1011 09/16/2018 1232 09/07/2018 1410 09/09/18 0143 09/10/18 0528  PROCALCITON  --   --  0.86  --   --   WBC 7.2 13.3*  --  14.3* 8.0       Avner Stroder 09/23/18, 7:50 AM

## 2018-09-19 NOTE — Progress Notes (Signed)
Spoke with pts daughter via telephone regarding plan of care she states she would like to transition pt to comfort measures only.  She stated she does not plan to come back to the hospital, however she would like to be notified when the pt expires.  Marda Stalker, [place] Pager [phone] (please enter 7 digits) PCCM Consult Pager [phone] (please enter 7 digits)

## 2018-09-19 NOTE — Progress Notes (Signed)
Patient passed away 0740 this morning. Two RNs pronounced death per MD order. Daughter notified via phone. MD notified. Charge Rn called AC and CDS.

## 2018-09-19 NOTE — Progress Notes (Signed)
Notified pts daughter [name] via telephone regarding decline in pts respiratory status.  Informed her pts O2 sats on 100% Bipap are in the 60's, and he remains tachypneic respiratory rate upper 30's despite 1 mg iv morphine.  She stated she understood and would like to ensure her father is comfortable and to be notified if there are any additional changes. RN to give additional 2 mg iv morphine for air hunger.  Will continue to monitor and assess pt.  Marda Stalker, [place] Pager [phone] (please enter 7 digits) PCCM Consult Pager [phone] (please enter 7 digits)

## 2018-09-19 NOTE — Progress Notes (Signed)
Call to the room by RN , pt spo2 at 60'S, called the NP and notified ,

## 2018-09-19 DEATH — deceased

## 2018-10-19 ENCOUNTER — Ambulatory Visit: Payer: Medicare Other

## 2018-11-13 ENCOUNTER — Ambulatory Visit: Payer: Medicare Other | Admitting: Family Medicine

## 2018-12-11 ENCOUNTER — Encounter: Payer: Medicare Other | Admitting: Cardiology

## 2019-07-29 IMAGING — MR MR HEAD W/O CM
10 series · 48 of 48 positions shown · non-contrast
Comparison: 04/18/2017 CT of the head.

CLINICAL DATA: [AGE]/o M; headache, eye pain, and left-sided
numbness.

EXAM:
MRI HEAD WITHOUT CONTRAST
TECHNIQUE: Multiplanar, multiecho pulse sequences of the brain and surrounding
structures were obtained without intravenous contrast.

[Series 4: DWI · axial · 3.0mm · 1.80mm/px · z∈[-68,+86]mm · 7 of 55 slices shown (1 of 2)]
[im 1/55]
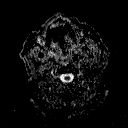
[im 10/55]
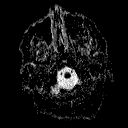
[im 19/55]
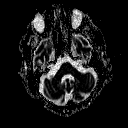
[im 28/55]
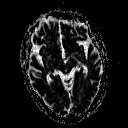
[im 37/55]
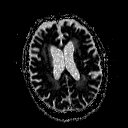
[im 46/55]
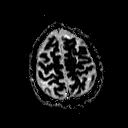
[im 55/55]
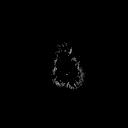

[Series 6: DWI · coronal · 3.0mm · 1.80mm/px · 6 of 49 slices shown (2 of 2)]
[im 1/49]
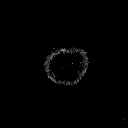
[im 10/49]
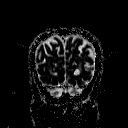
[im 20/49]
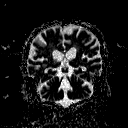
[im 29/49]
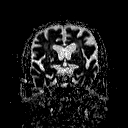
[im 39/49]
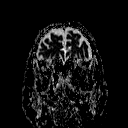
[im 49/49]
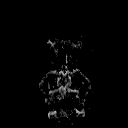

[Series 7: T2 · axial · 5.0mm · 0.90mm/px · z∈[-73,+88]mm · 3 of 27 slices shown (1 of 3)]
[im 1/27]
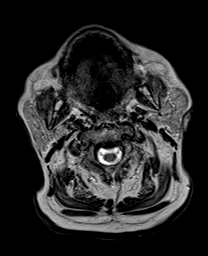
[im 14/27]
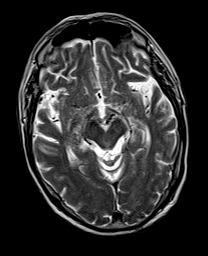
[im 27/27]
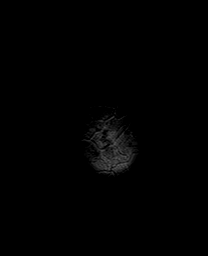

[Series 8: FLAIR · axial · 5.0mm · 0.45mm/px · z∈[-73,+88]mm · 3 of 27 slices shown]
[im 1/27]
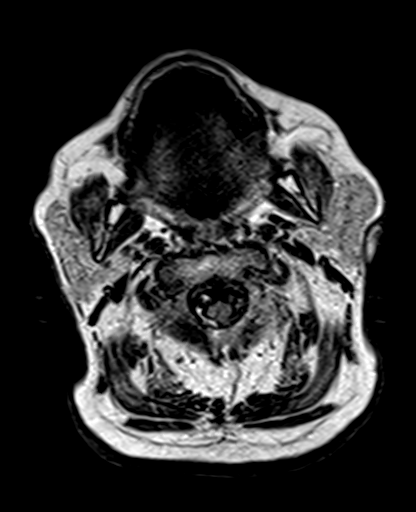
[im 14/27]
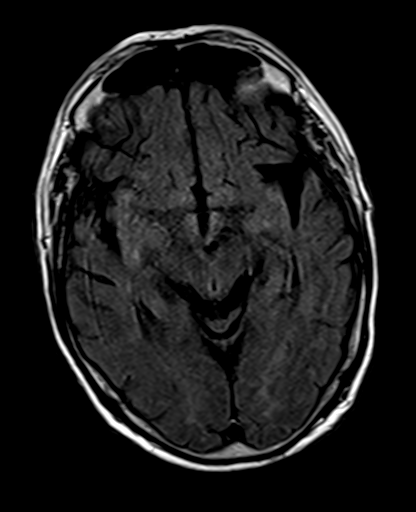
[im 27/27]
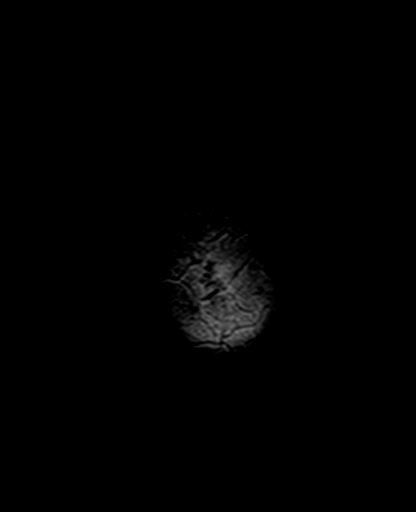

[Series 9: T2 · axial · 5.0mm · 0.45mm/px · z∈[-73,+88]mm · 3 of 27 slices shown (2 of 3)]
[im 1/27]
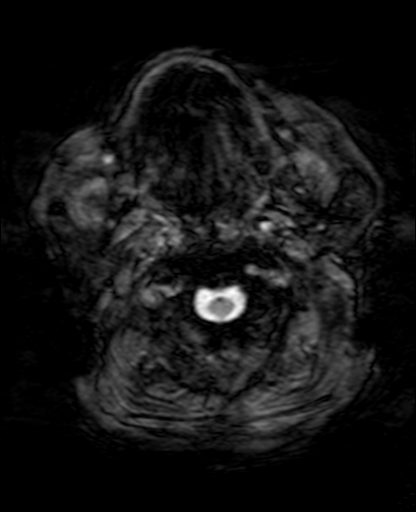
[im 14/27]
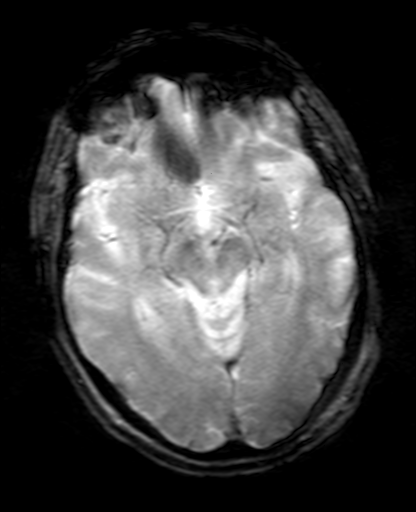
[im 27/27]
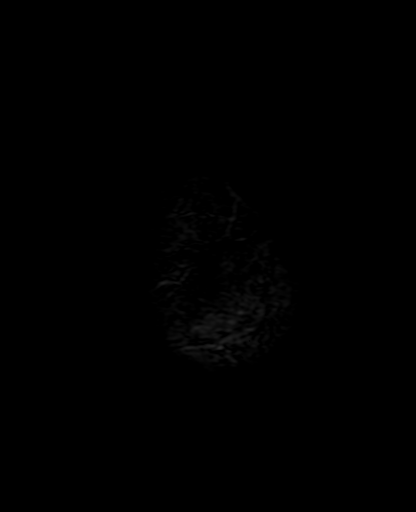

[Series 10: T1 · axial · 3.0mm · 1.00mm/px · z∈[-72,+96]mm · 7 of 60 slices shown (1 of 2)]
[im 1/60]
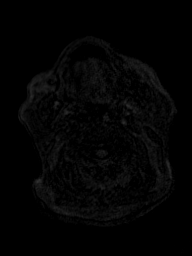
[im 10/60]
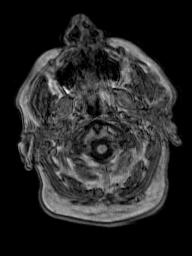
[im 20/60]
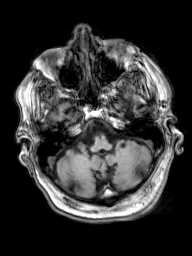
[im 30/60]
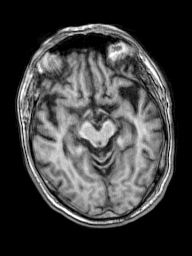
[im 40/60]
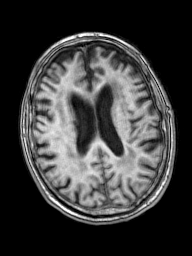
[im 50/60]
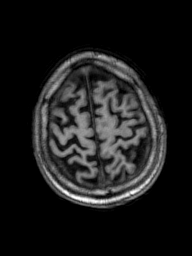
[im 60/60]
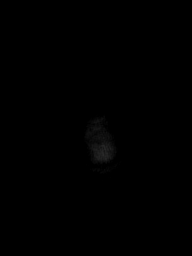

[Series 11: T2 · coronal · 5.0mm · 0.86mm/px · 4 of 31 slices shown (3 of 3)]
[im 1/31]
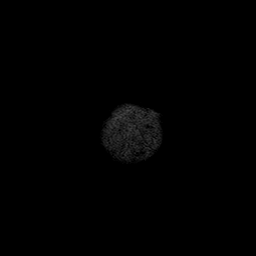
[im 11/31]
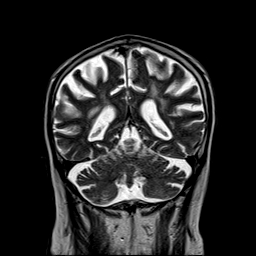
[im 21/31]
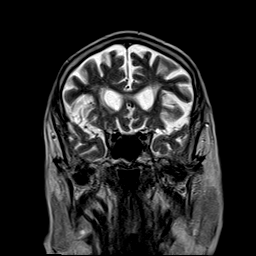
[im 31/31]
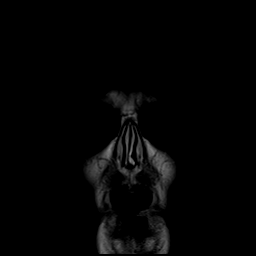

[Series 12: T1 · sagittal · 5.0mm · 0.45mm/px · 3 of 29 slices shown (2 of 2)]
[im 1/29]
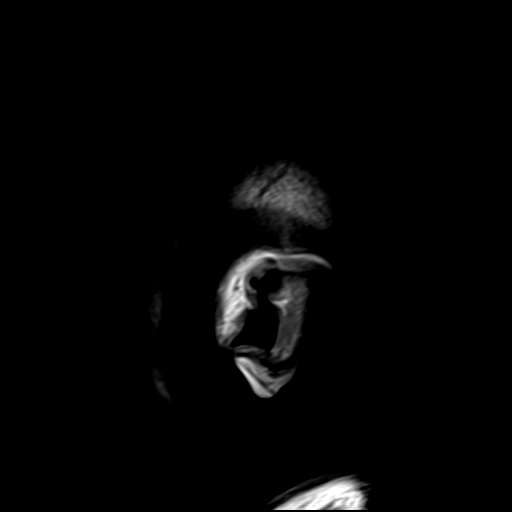
[im 15/29]
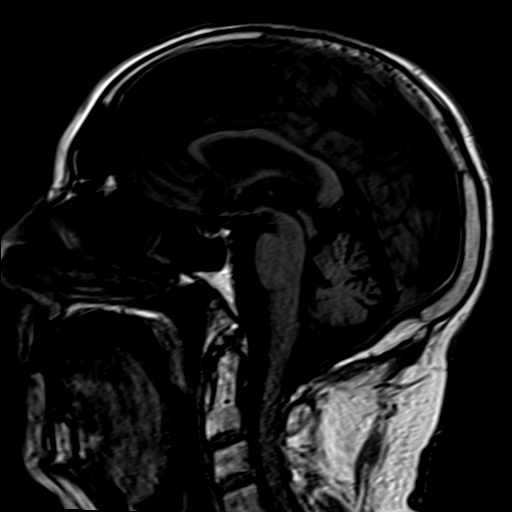
[im 29/29]
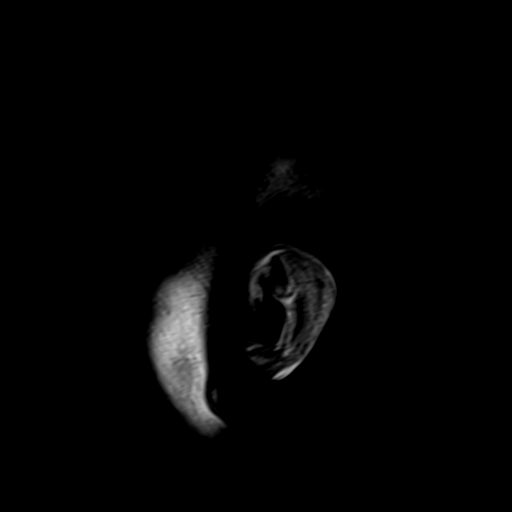

[Series 100: ax (id) · axial · 3.0mm · 1.80mm/px · z∈[-68,+86]mm · 6 of 55 slices shown]
[im 1/55]
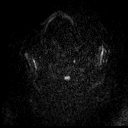
[im 11/55]
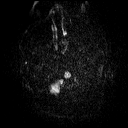
[im 22/55]
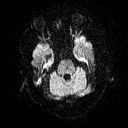
[im 33/55]
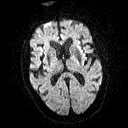
[im 44/55]
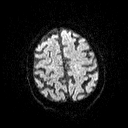
[im 55/55]
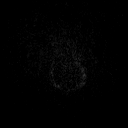

[Series 101: cor (id) · coronal · 3.0mm · 1.80mm/px · 6 of 48 slices shown]
[im 1/48]
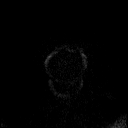
[im 10/48]
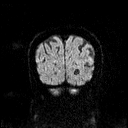
[im 19/48]
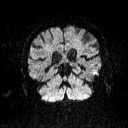
[im 29/48]
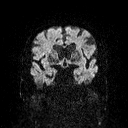
[im 38/48]
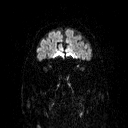
[im 48/48]
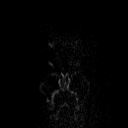

[48 of 48 positions shown; findings below may reference images not displayed]

FINDINGS: Brain: No acute infarction, hemorrhage, hydrocephalus, extra-axial
collection or mass lesion. Patchy nonspecific foci of T2 FLAIR
hyperintense signal abnormality in subcortical and periventricular
white matter are compatible with moderate chronic microvascular
ischemic changes for age. Moderate brain parenchymal volume loss.
Several small chronic lacunar infarcts in bilateral basal ganglia.

Vascular: Normal flow voids.

Skull and upper cervical spine: Normal marrow signal.

Sinuses/Orbits: Negative.

Other: None.
IMPRESSION: 1. No acute intracranial abnormality identified.
2. Moderate for age chronic microvascular ischemic changes and
parenchymal volume loss of the brain.

By: Blain Jumper M.D.
